# Patient Record
Sex: Female | Born: 1955
Health system: Southern US, Community
[De-identification: ages and names within clinical notes are randomized; demographics above are authoritative.]

## PROBLEM LIST (undated history)

## (undated) DIAGNOSIS — E119 Type 2 diabetes mellitus without complications: Secondary | ICD-10-CM

## (undated) DIAGNOSIS — R609 Edema, unspecified: Secondary | ICD-10-CM

## (undated) DIAGNOSIS — E7800 Pure hypercholesterolemia, unspecified: Secondary | ICD-10-CM

## (undated) DIAGNOSIS — I10 Essential (primary) hypertension: Secondary | ICD-10-CM

## (undated) DIAGNOSIS — R6250 Unspecified lack of expected normal physiological development in childhood: Secondary | ICD-10-CM

## (undated) DIAGNOSIS — J40 Bronchitis, not specified as acute or chronic: Secondary | ICD-10-CM

## (undated) DIAGNOSIS — D649 Anemia, unspecified: Secondary | ICD-10-CM

## (undated) DIAGNOSIS — I429 Cardiomyopathy, unspecified: Secondary | ICD-10-CM

## (undated) HISTORY — PX: COLONOSCOPY: SHX174

## (undated) HISTORY — DX: Essential (primary) hypertension: I10

## (undated) HISTORY — DX: Edema, unspecified: R60.9

## (undated) HISTORY — PX: OTHER SURGICAL HISTORY: SHX169

## (undated) HISTORY — DX: Cardiomyopathy, unspecified: I42.9

---

## 2008-04-28 ENCOUNTER — Ambulatory Visit: Payer: Self-pay | Admitting: Cardiology

## 2008-06-23 ENCOUNTER — Ambulatory Visit: Payer: Self-pay | Admitting: Cardiology

## 2008-07-08 ENCOUNTER — Ambulatory Visit: Payer: Self-pay | Admitting: Cardiology

## 2008-07-08 ENCOUNTER — Encounter: Payer: Self-pay | Admitting: Cardiology

## 2008-07-08 LAB — CONVERTED CEMR LAB
BUN: 12 mg/dL (ref 6–23)
CO2: 24 meq/L (ref 19–32)
Chloride: 101 meq/L (ref 96–112)
Potassium: 4.4 meq/L (ref 3.5–5.3)

## 2008-11-10 DIAGNOSIS — R609 Edema, unspecified: Secondary | ICD-10-CM | POA: Insufficient documentation

## 2008-11-10 DIAGNOSIS — I152 Hypertension secondary to endocrine disorders: Secondary | ICD-10-CM | POA: Insufficient documentation

## 2008-11-10 DIAGNOSIS — I10 Essential (primary) hypertension: Secondary | ICD-10-CM

## 2008-11-10 DIAGNOSIS — I429 Cardiomyopathy, unspecified: Secondary | ICD-10-CM

## 2008-11-17 ENCOUNTER — Ambulatory Visit: Payer: Self-pay | Admitting: Cardiology

## 2008-12-22 ENCOUNTER — Encounter: Payer: Self-pay | Admitting: Cardiology

## 2008-12-22 ENCOUNTER — Ambulatory Visit: Payer: Self-pay

## 2009-02-10 ENCOUNTER — Ambulatory Visit: Payer: Self-pay | Admitting: Gastroenterology

## 2009-12-21 ENCOUNTER — Ambulatory Visit: Payer: Self-pay | Admitting: Cardiovascular Disease

## 2010-07-19 NOTE — Assessment & Plan Note (Signed)
Summary: F1Y/AMD  Medications Added PREMPRO 0.625-2.5 MG TABS (CONJ ESTROG-MEDROXYPROGEST ACE) 1 once daily PREVACID SOLUTAB 30 MG TBDP (LANSOPRAZOLE) 1 once daily FUROSEMIDE 20 MG TABS (FUROSEMIDE) 1 once daily LISINOPRIL 20 MG TABS (LISINOPRIL) 1 once daily KLOR-CON M10 10 MEQ CR-TABS (POTASSIUM CHLORIDE CRYS CR) 1 once daily LEVOXYL 100 MCG TABS (LEVOTHYROXINE SODIUM) 1 once daily LIPITOR 20 MG TABS (ATORVASTATIN CALCIUM) Take one tablet by mouth daily.      Allergies Added: NKDA  Visit Type:  Follow-up Primary Provider:  Meindert Niemeyer,M.D.  CC:  No cardiac complants.  History of Present Illness: 55 year old woman with obesity, hypertension, mild LV dysfunction with mildly dilated left atrium and left ventricle thought to be secondary to hypertension, presents for routine followup.  Overall, she states that she is doing well. Her weight has been an issue. She is not very compliant with her diet. Her blood pressure has been well-controlled with systolic pressures in the 130s, diastolic in the 80s. She has no complaints. No significant lower extremity edema. She has edema, she takes her Lasix. She's otherwise been tolerating her medicines well. She has a job, is on her feet more on the weekends and has more swelling of her legs on the weekends.  EKG shows normal sinus rhythm with rate 71 beats per minute, no significant ST or T wave changes.  Current Medications (verified): 1)  Prempro 0.625-2.5 Mg Tabs (Conj Estrog-Medroxyprogest Ace) .Marland Kitchen.. 1 Once Daily 2)  Prevacid Solutab 30 Mg Tbdp (Lansoprazole) .Marland Kitchen.. 1 Once Daily 3)  Furosemide 20 Mg Tabs (Furosemide) .Marland Kitchen.. 1 Once Daily 4)  Lisinopril 20 Mg Tabs (Lisinopril) .Marland Kitchen.. 1 Once Daily 5)  Klor-Con M10 10 Meq Cr-Tabs (Potassium Chloride Crys Cr) .Marland Kitchen.. 1 Once Daily 6)  Levoxyl 100 Mcg Tabs (Levothyroxine Sodium) .Marland Kitchen.. 1 Once Daily 7)  Lipitor 20 Mg Tabs (Atorvastatin Calcium) .... Take One Tablet By Mouth Daily.  Allergies  (verified): No Known Drug Allergies  Past History:  Past Medical History: Last updated: 11/10/2008 EDEMA (ICD-782.3) HYPERTENSION, UNSPECIFIED (ICD-401.9) CARDIOMYOPATHY, SECONDARY (ICD-425.9)    Family History: Last updated: 11/10/2008 Family History of Coronary Artery Disease:  Family History of CVA or Stroke:   Social History: Last updated: 11/10/2008 Full Time Single  Tobacco Use - No.  Alcohol Use - no Regular Exercise - yes Drug Use - no  Risk Factors: Exercise: yes (11/10/2008)  Risk Factors: Smoking Status: never (11/10/2008)  Review of Systems  The patient denies fever, weight loss, weight gain, vision loss, decreased hearing, hoarseness, chest pain, syncope, dyspnea on exertion, peripheral edema, prolonged cough, abdominal pain, incontinence, muscle weakness, depression, and enlarged lymph nodes.    Vital Signs:  Patient profile:   55 year old female Height:      66 inches Weight:      238 pounds BMI:     38.55 Pulse rate:   64 / minute BP sitting:   140 / 92  (left arm) Cuff size:   large  Vitals Entered By: Bishop Dublin, CMA (December 21, 2009 10:54 AM)  Physical Exam  General:  Well developed, well nourished, in no acute distress.Obese Head:  normocephalic and atraumatic Neck:  Neck supple, no JVD. No masses, thyromegaly or abnormal cervical nodes. Chest Wall:  no deformities or breast masses noted Lungs:  Clear bilaterally to auscultation and percussion. Heart:  Non-displaced PMI, chest non-tender; regular rate and rhythm, S1, S2 without murmurs, rubs or gallops. Carotid upstroke normal, no bruit.  Pedals normal pulses. No edema, no varicosities.  Abdomen:  Bowel sounds positive; abdomen soft and non-tender without masses Msk:  Back normal, normal gait. Muscle strength and tone normal. Pulses:  pulses normal in all 4 extremities Extremities:  No clubbing or cyanosis. Neurologic:  Alert and oriented x 3. Skin:  Intact without lesions or  rashes. Psych:  Normal affect.   Impression & Recommendations:  Problem # 1:  HYPERTENSION, UNSPECIFIED (ICD-401.9) blood pressure is borderline elevated today though she reports it is well-controlled at home. He made no changes to her current medications I have asked her to contact me if her pressure is elevated and we could increase her dose of lisinopril or add a second medicine.  Her updated medication list for this problem includes:    Furosemide 20 Mg Tabs (Furosemide) .Marland Kitchen... 1 once daily    Lisinopril 20 Mg Tabs (Lisinopril) .Marland Kitchen... 1 once daily  Problem # 2:  CARDIOMYOPATHY, SECONDARY (ICD-425.9) No symptoms of shortness of breath or heart failure. We'll continue her on her diuretic with good blood pressure control. I will talk to her on her next visit about snoring and sleep apnea.  Her updated medication list for this problem includes:    Furosemide 20 Mg Tabs (Furosemide) .Marland Kitchen... 1 once daily    Lisinopril 20 Mg Tabs (Lisinopril) .Marland Kitchen... 1 once daily  Orders: EKG w/ Interpretation (93000)  Problem # 3:  EDEMA (ICD-782.3) Minimal edema. This may in fact be due to venous insufficiency as it gets worse on the weekends when she is standing for prolonged periods of time. I've encouraged her to wear compression hose when she stands for prolonged periods.  Patient Instructions: 1)  Your physician recommends that you continue on your current medications as directed. Please refer to the Current Medication list given to you today. 2)  Your physician wants you to follow-up in:  1 year  You will receive a reminder letter in the mail two months in advance. If you don't receive a letter, please call our office to schedule the follow-up appointment.  Appended Document: F1Y/AMD she does not have significant cardiac risk factors for coronary artery disease. She is under our age of 35 for women for starting aspirin. We'll hold off on aspirin for several years.

## 2010-11-01 NOTE — Assessment & Plan Note (Signed)
[facility] OFFICE NOTE   NAME:NEWELLShatoria, Stooksbury                       MRN:          [id number]  DATE:04/28/2008                            DOB:          [birth year]    HISTORY OF PRESENT ILLNESS:  I was asked by Dr. Gelene Mink to consult  on Camilla Skeen, 55 year old single white female with a  cardiomyopathy.   Ms. Dech has been seeing Dr. Timoteo Gaul for some lower extremity edema and  some problems with breathing.  Apparently, she has restrictive lung  defect from obesity and has allergies and seasonal rhinitis.  He started  her on a inhaler, which has helped in the form of Nasonex and Symbicort.   A 2-D echocardiogram on March 02, 2008 showed LVEF of 45%, mildly  dilated left atrium and left ventricle, mild left ventricular systolic  dysfunction, septal hypokinesia, and mild left ventricular hypertrophy.  There are no valvular abnormalities.   She denies any orthopnea, PND, or significant dyspnea on exertion.  She  has had no angina.   PAST MEDICAL HISTORY:   ALLERGIES:  She is intolerant of ALLEGRA.   MEDICATIONS:  She is currently on Symbicort 2 puffs daily, Levoxyl 100  mcg per day, fexofenadine 180 mg a day, lisinopril 10 mg a day, Lipitor  20 mg a day, Prempro 0.625/2.5 daily, Prevacid 30 mg p.o. daily, and  Nasonex 2 sprays daily.   SOCIAL HISTORY:  She does not smoke.  She does not drink.  She enjoys  walking and using her exercise bike.  She works in a Nurse, adult, etc.   She had a history of hypertension for several years according to her  family.   SURGICAL HISTORY:  Negative.   FAMILY HISTORY:  Father died of a heart attack and stroke at age 76.   REVIEW OF SYSTEMS:  Other than HPI is remarkable for borderline diabetes  and hypothyroidism on replacement therapy.  She also has hyperlipidemia.   PHYSICAL EXAMINATION:  VITAL SIGNS:  Her blood pressure  today was  130/76, her pulse is 77 and regular.  EKG is normal.  She weighs 237  pounds.  GENERAL:  She is very pleasant, alert, and oriented x3, answers  questions appropriately.  HEENT:  Essentially normal.  NECK:  Supple.  Carotids upstrokes are equal bilaterally without bruits,  no JVD.  Thyroid is not enlarged.  LUNGS:  Clear to auscultation and percussion with no rhonchi, wheezes,  or rales.  HEART:  Reveals a poorly appreciated PMI.  There is no S3 gallop.  ABDOMEN:  Protuberant, good bowel sounds.  Organomegaly cannot be easily  assessed.  EXTREMITIES:  Reveal 1+ edema.  Pulses are intact.   ASSESSMENT:  Hypertensive cardiomyopathy.  There is also a contribution  here from her restrictive lung disease and obesity.   PLAN:  1. Increase lisinopril to 20 mg a day.  2. Salt restriction.  3. Weight reduction.  4. Regular exercise.  5. See [place] back in about 4-6 weeks.  We may have to add low dose  of a      diuretic as well.   I looked through Dr. Marliss Coots records and her labs including a CBC,  comprehensive metabolic panel, and a TSH were normal.  Her free T4 was  slightly up, which will need to be followed up with Dr. Timoteo Gaul.  Her  total cholesterol was 180.  Her LDL is 96.  Her triglycerides are 103  and her HDL 63.     Thomas C. Daleen Squibb, MD, [facility]  Electronically Signed    TCW/MedQ  DD: 04/28/2008  DT: 04/29/2008  Job #: [id number]   cc:   Galen Daft. Timoteo Gaul, MD

## 2010-11-01 NOTE — Assessment & Plan Note (Signed)
[facility] OFFICE NOTE   NAME:NEWELLNaturi, Alarid                       MRN:          [id number]  DATE:11/17/2008                            DOB:          1956/05/31    Ms. Usery comes in today for followup of her hypertensive  cardiomyopathy, hypertension, obesity.   I saw her last on June 23, 2008.   At that time, she did not have good blood pressure control.  We added  furosemide 20 mg p.o. q.a.m. which has helped her edema.  She also wears  support hose.  We also emphasized potassium-rich diets.  We followed up  a chemistry which showed a potassium of 4.1, BUN of 12, creatinine of  0.75.  Her blood sugar was elevated at 124.   She has no complaints today including any symptoms of TIAs or mini  strokes, syncope or presyncope, palpitations, orthopnea, PND, and she  only has minor peripheral edema.  She walks about 30 minutes 5 times a  week.   Her meds are unchanged except for the addition of the furosemide which I  gave her last time.   PHYSICAL EXAMINATION:  VITAL SIGNS:  Blood pressure 130/78, her pulse 80  and regular, weight is 236 which is stable.  HEENT:  Unchanged.  Carotid upstrokes were equal bilaterally with soft  systolic sound on the left.  CHEST:  Lungs are clear to auscultation and percussion.  HEART:  A nondisplaced PMI, normal S1 and S2, soft systolic murmur along  the left sternal border.  There is no gallop.  ABDOMEN:  Soft, good bowel sounds.  No midline bruit.  No hepatomegaly.  EXTREMITIES:  No sinus clubbing, but she does have trace pretibial  edema.  She is wearing support hose.  Pulses are intact.  No sign of  DVT.  NEURO:  She is alert and oriented x3.   ASSESSMENT AND PLAN:  Ms. Killough blood pressure is better.  I have  emphasized again regular exercise which she is doing and also weight  control.  Followup BMET was within normal limits on furosemide.  She  does have a  carotid sound and has not had an echocardiogram in nearly a  year to assess her LV function.  We will arrange for carotid Dopplers  and an echocardiogram.  Assuming these were stable, we will plan on  seeing her back in a year.  If she has any carotid disease, we will  place her on enteric-coated aspirin.    Thomas C. Daleen Squibb, MD, [facility]  Electronically Signed   TCW/MedQ  DD: 11/17/2008  DT: 11/18/2008  Job #: [id number]   cc:   Dr. Gelene Mink

## 2010-11-01 NOTE — Assessment & Plan Note (Signed)
[facility] OFFICE NOTE   NAME:Emma Fletcher, Emma Fletcher                       MRN:          [id number]  DATE:06/23/2008                            DOB:          07/21/1955    Ms. Yearick comes in today for followup.  I saw her initially on April 28, 2008, for her hypertensive cardiomyopathy.   I increased her lisinopril to 20 mg per day from 10 mg per day.   Her care Raenell Mensing says that her blood pressure is still running in the  146s over 80s.  Her weight has dropped 2 pounds, however.  Her edema  seems to be a little bit better.  She wears support hose up to her  knees.   She offers no new complaints.   PHYSICAL EXAMINATION:  VITAL SIGNS:  Her blood pressure today is 147/88,  pulse is 89 and regular, her weight is 235.  HEART:  Regular rate and rhythm.  No gallop.  LUNGS:  Clear.  EXTREMITIES:  1+ pitting edema.  Pulses were present.  Rest of her exam is unchanged.   ASSESSMENT:  1. Hypertensive cardiomyopathy.  2. Mild peripheral edema.  3. Hypertension, under non-optimal control.   PLAN:  1. Furosemide 20 mg p.o. q.a.m.  2. Potassium rich foods.  3. Follow a chemistry profile in a week to 10 days.  4. See me back in 3 months.   Her caretaker knows that her blood pressure needs to be in the 130  systolic over 80s or less diastolic.     Thomas C. Daleen Squibb, MD, [facility]  Electronically Signed    TCW/MedQ  DD: 06/23/2008  DT: 06/24/2008  Job #: [id number]   cc:   Gelene Mink, MD

## 2010-11-29 ENCOUNTER — Encounter: Payer: Self-pay | Admitting: Cardiovascular Disease

## 2012-12-14 ENCOUNTER — Ambulatory Visit: Payer: Self-pay | Admitting: Family Medicine

## 2015-04-23 ENCOUNTER — Encounter: Payer: Self-pay | Admitting: Sports Medicine

## 2015-04-23 ENCOUNTER — Ambulatory Visit (INDEPENDENT_AMBULATORY_CARE_PROVIDER_SITE_OTHER): Payer: Medicare Other | Admitting: Sports Medicine

## 2015-04-23 DIAGNOSIS — R600 Localized edema: Secondary | ICD-10-CM

## 2015-04-23 DIAGNOSIS — E119 Type 2 diabetes mellitus without complications: Secondary | ICD-10-CM | POA: Diagnosis not present

## 2015-04-23 DIAGNOSIS — L603 Nail dystrophy: Secondary | ICD-10-CM

## 2015-04-23 DIAGNOSIS — L84 Corns and callosities: Secondary | ICD-10-CM

## 2015-04-23 DIAGNOSIS — L309 Dermatitis, unspecified: Secondary | ICD-10-CM

## 2015-04-23 MED ORDER — CLOTRIMAZOLE 1 % EX CREA: 1.0000 "application " | TOPICAL_CREAM | Freq: Two times a day (BID) | CUTANEOUS | Status: DC

## 2015-04-23 NOTE — Progress Notes (Deleted)
   Subjective:    Patient ID: Emma Fletcher, female    DOB: [birth year], 59 y.o.   MRN: [id number]  HPI    Review of Systems  HENT: Positive for hearing loss.        Sinus problems   Cardiovascular:       High BP   Gastrointestinal:       Reflex  Endocrine:       Diabetes   Musculoskeletal:       Gout Swelling of the feet or legs   All other systems reviewed and are negative.      Objective:   Physical Exam        Assessment & Plan:

## 2015-04-23 NOTE — Progress Notes (Signed)
Patient ID: Emma Fletcher, female   DOB: 07-Aug-1955, 59 y.o.   MRN: [id number] Subjective: Emma Fletcher is a 59 y.o. female patient with history of type 2 diabetes who presents to office today complaining of callus skin and long, painful nails  while ambulating in shoes; unable to trim. Patient states that the glucose reading this morning was 96 mg/dl. Patient is assisted by Aunt. Patient also states that her left ankle swells after sprain 3 years ago. Admits to callus skin on bottom of feet and peeling; soaks multiple times per day; denies any new changes in medication or other problems. Patient denies any new cramping, numbness, burning or tingling in the legs.  Admits to past hx of gout in left big toe that remains resolved at this time.   Patient Active Problem List   Diagnosis Date Noted  . HYPERTENSION, UNSPECIFIED 11/10/2008  . CARDIOMYOPATHY, SECONDARY 11/10/2008  . EDEMA 11/10/2008   Current Outpatient Prescriptions on File Prior to Visit  Medication Sig Dispense Refill  . atorvastatin (LIPITOR) 20 MG tablet Take 20 mg by mouth daily.      Marland Kitchen estrogen, conjugated,-medroxyprogesterone (PREMPRO) 0.625-2.5 MG per tablet Take 1 tablet by mouth daily.      . furosemide (LASIX) 20 MG tablet Take 20 mg by mouth daily.      . lansoprazole (PREVACID SOLUTAB) 30 MG disintegrating tablet Take 30 mg by mouth daily.      Marland Kitchen levothyroxine (SYNTHROID, LEVOTHROID) 100 MCG tablet Take 100 mcg by mouth daily.      Marland Kitchen lisinopril (PRINIVIL,ZESTRIL) 20 MG tablet Take 20 mg by mouth daily.      . potassium chloride (KLOR-CON) 10 MEQ CR tablet Take 10 mEq by mouth daily.       No current facility-administered medications on file prior to visit.   Allergies not on file   Labs: HEMOGLOBIN A1C- No recent lab on file  Objective: General: Patient is awake, alert, and oriented x 3 and in no acute distress.  Integument: Skin is warm, dry and supple bilateral. Nails are tender, long,  and  dystrophic ,  1-5 bilateral. No signs of infection.  Callus present to right 5th met base with nucleated core with no signs of infection. There is significant peeling of plantar skin bilateral with likely superimposed dermatitis secondary to patient soaking her feet multiple times a day in hot water. Remaining integument unremarkable.  Vasculature:  Dorsalis Pedis pulse 2/4 bilateral. Posterior Tibial pulse 1 /4 bilateral.  Capillary fill time <3 sec 1-5 bilateral. Scant hair growth to the level of the digits. Temperature gradient within normal limits. + varicosities present bilateral. + edema present L>R ankle; old hx of sprain.   Neurology: The patient has intact sensation measured with a 5.07/10g Semmes Weinstein Monofilament at all pedal sites bilateral . Vibratory sensation diminished bilateral with tuning fork. No Babinski sign present bilateral.   Musculoskeletal: No gross pedal deformities noted bilateral. Muscular strength 5/5 in all lower extremity muscular groups bilateral without pain or limitation on range of motion . No tenderness with calf compression bilateral.  Assessment and Plan: Problem List Items Addressed This Visit      Other   EDEMA    Other Visit Diagnoses    Callus    -  Primary    Right 5th met base    Dermatitis        Relevant Medications    clotrimazole (LOTRIMIN) 1 % cream    Dystrophic nail  Diabetes mellitus without complication ([place])          -Examined patient. -Discussed and educated patient on diabetic foot care, especially with  regards to the vascular, neurological and musculoskeletal systems.  -Stressed the importance of good glycemic control and the detriment of not  controlling glucose levels in relation to the foot. -Mechanically trimmed all nails 1-5 bilateral using sterile nail nipper and filed with dremel without incident  -Debrided callus x 1 to right 5th met base using sterile chisel blade without incident. -Pared free edges of peeling skin at  plantar aspects of both feet using sterile chisel blade without incident; Rx Lotrimin cream to apply daily; Advised patient to refrain from repeated soaking. -Dispense left ankle for edema control and support. -Answered all patient questions -Patient to return as needed or in 3 months for at risk foot care -Patient advised to call the office if any problems or questions arise in the  Meantime.  Landis Martins, DPM

## 2015-07-30 ENCOUNTER — Encounter: Payer: Self-pay | Admitting: Sports Medicine

## 2015-07-30 ENCOUNTER — Ambulatory Visit (INDEPENDENT_AMBULATORY_CARE_PROVIDER_SITE_OTHER): Payer: Medicare Other | Admitting: Sports Medicine

## 2015-07-30 DIAGNOSIS — B351 Tinea unguium: Secondary | ICD-10-CM

## 2015-07-30 DIAGNOSIS — L603 Nail dystrophy: Secondary | ICD-10-CM

## 2015-07-30 DIAGNOSIS — M79671 Pain in right foot: Secondary | ICD-10-CM | POA: Diagnosis not present

## 2015-07-30 DIAGNOSIS — M779 Enthesopathy, unspecified: Secondary | ICD-10-CM | POA: Diagnosis not present

## 2015-07-30 DIAGNOSIS — M79672 Pain in left foot: Secondary | ICD-10-CM | POA: Diagnosis not present

## 2015-07-30 DIAGNOSIS — E119 Type 2 diabetes mellitus without complications: Secondary | ICD-10-CM | POA: Diagnosis not present

## 2015-07-30 MED ORDER — TRIAMCINOLONE ACETONIDE 10 MG/ML IJ SUSP: 10.0000 mg | Freq: Once | INTRAMUSCULAR | Status: DC

## 2015-07-30 NOTE — Progress Notes (Signed)
Patient ID: BRAYLEY MACKOWIAK, female   DOB: [birth year], 60 y.o.   MRN: [id number]  Subjective: Emma Fletcher is a 60 y.o. female patient with history of type 2 diabetes who presents to office today complaining of long, painful nails  while ambulating in shoes; unable to trim and a new pain that started last week at the outside of her right foot; states that she has been working more at the [employer] and this may have caused her foot to hurt some. Patient states that the glucose reading this morning was 65 mg/dl. Patient is assisted by Aunt.  Patient denies any new cramping, numbness, burning or tingling in the legs.   Patient Active Problem List   Diagnosis Date Noted  . HYPERTENSION, UNSPECIFIED 11/10/2008  . CARDIOMYOPATHY, SECONDARY 11/10/2008  . EDEMA 11/10/2008   Current Outpatient Prescriptions on File Prior to Visit  Medication Sig Dispense Refill  . atorvastatin (LIPITOR) 20 MG tablet Take 20 mg by mouth daily.      . clotrimazole (LOTRIMIN) 1 % cream Apply 1 application topically 2 (two) times daily. 30 g 0  . estrogen, conjugated,-medroxyprogesterone (PREMPRO) 0.625-2.5 MG per tablet Take 1 tablet by mouth daily.      . furosemide (LASIX) 20 MG tablet Take 20 mg by mouth daily.      Marland Kitchen levothyroxine (SYNTHROID, LEVOTHROID) 100 MCG tablet Take 100 mcg by mouth daily.      Marland Kitchen lisinopril (PRINIVIL,ZESTRIL) 20 MG tablet Take 20 mg by mouth daily.      . potassium chloride (KLOR-CON) 10 MEQ CR tablet Take 10 mEq by mouth daily.       No current facility-administered medications on file prior to visit.   No Known Allergies  Objective: General: Patient is awake, alert, and oriented x 3 and in no acute distress.  Integument: Skin is warm, dry and supple bilateral. Nails are tender, long,  and  dystrophic , 1-5 bilateral. No signs of infection.  Minimal Callus present to right 5th met base. There is mild soft tissue swelling at base of the right 5th metatarsal at the dorsolateral  aspect with no signs of infection. No peeling or scaly skin plantar aspects of both feet. Remaining integument unremarkable.  Vasculature:  Dorsalis Pedis pulse 2/4 bilateral. Posterior Tibial pulse 1 /4 bilateral. Capillary fill time <3 sec 1-5 bilateral. Scant hair growth to the level of the digits.Temperature gradient within normal limits. + varicosities present bilateral.   Neurology: The patient has intact sensation measured with a 5.07/10g Semmes Weinstein Monofilament at all pedal sites bilateral . Vibratory sensation diminished bilateral with tuning fork. No Babinski sign present bilateral.   Musculoskeletal: Pain with palpation of 5th metatarsal base on right with associated swelling, No gross pedal deformities noted bilateral. Muscular strength 5/5 in all lower extremity muscular groups bilateral without pain or limitation on range of motion except with inversion at base of 5th met on right . No tenderness with calf compression bilateral.  Assessment and Plan: Problem List Items Addressed This Visit    None    Visit Diagnoses    Diabetes mellitus without complication ([place])    -  Primary    Relevant Medications    pioglitazone (ACTOS) 15 MG tablet    TRADJENTA 5 MG TABS tablet    glimepiride (AMARYL) 2 MG tablet    Dystrophic nail        Tendonitis        PB at 5th met base on right  Relevant Medications    triamcinolone acetonide (KENALOG) 10 MG/ML injection 10 mg (Start on 07/30/2015  2:30 PM)    Foot pain, bilateral          -Examined patient. -Discussed and educated patient on diabetic foot care, especially with  regards to the vascular, neurological and musculoskeletal systems.  -Stressed the importance of good glycemic control and the detriment of not  controlling glucose levels in relation to the foot. -Mechanically trimmed all nails 1-5 bilateral using sterile nail nipper and filed with dremel without incident  -After oral consent and aseptic prep, injected a mixture  containing 1 ml of 2%  plain lidocaine, 1 ml 0.5% plain marcaine, 0.5 ml of kenalog 10 and 0.5 ml of dexamethasone phosphate into right 5th metatarsal base. Post-injection care discussed with patient. Recommend ice at end of work shift until area feels better -Cont with lotrimin as needed -Cont with compression stockings daily -Answered all patient questions -Patient to return as needed or in 3 months for at risk foot care -Patient advised to call the office if any problems or questions arise in the meantime.  Landis Martins, DPM

## 2015-09-07 ENCOUNTER — Ambulatory Visit (INDEPENDENT_AMBULATORY_CARE_PROVIDER_SITE_OTHER): Payer: Medicare Other | Admitting: Sports Medicine

## 2015-09-07 ENCOUNTER — Encounter: Payer: Self-pay | Admitting: Sports Medicine

## 2015-09-07 ENCOUNTER — Ambulatory Visit (INDEPENDENT_AMBULATORY_CARE_PROVIDER_SITE_OTHER): Payer: Medicare Other

## 2015-09-07 DIAGNOSIS — E119 Type 2 diabetes mellitus without complications: Secondary | ICD-10-CM | POA: Diagnosis not present

## 2015-09-07 DIAGNOSIS — M79671 Pain in right foot: Secondary | ICD-10-CM

## 2015-09-07 DIAGNOSIS — M779 Enthesopathy, unspecified: Secondary | ICD-10-CM | POA: Diagnosis not present

## 2015-09-07 NOTE — Progress Notes (Signed)
Patient ID: MEI SUITS, female   DOB: 08/10/1955, 60 y.o.   MRN: [id number]   Subjective: Emma Fletcher is a 60 y.o. female patient with history of type 2 diabetes who presents to office today complaining of pain that has started back at the side of her right foot; states that she has been working more at the [employer] and this may have caused her foot to hurt some more. Patient states that the first injection helped a lot at the area. Patient states that the glucose reading this morning was 95 mg/dl.  Patient denies any new cramping, numbness, burning or tingling in the legs.   Patient Active Problem List   Diagnosis Date Noted  . HYPERTENSION, UNSPECIFIED 11/10/2008  . CARDIOMYOPATHY, SECONDARY 11/10/2008  . EDEMA 11/10/2008   Current Outpatient Prescriptions on File Prior to Visit  Medication Sig Dispense Refill  . atorvastatin (LIPITOR) 20 MG tablet Take 20 mg by mouth daily.      . Choline Fenofibrate (FENOFIBRIC ACID) 135 MG CPDR     . clotrimazole (LOTRIMIN) 1 % cream Apply 1 application topically 2 (two) times daily. 30 g 0  . estrogen, conjugated,-medroxyprogesterone (PREMPRO) 0.625-2.5 MG per tablet Take 1 tablet by mouth daily.      . furosemide (LASIX) 20 MG tablet Take 20 mg by mouth daily.      Marland Kitchen glimepiride (AMARYL) 2 MG tablet     . lansoprazole (PREVACID) 30 MG capsule     . levothyroxine (SYNTHROID, LEVOTHROID) 100 MCG tablet Take 100 mcg by mouth daily.      Marland Kitchen lisinopril (PRINIVIL,ZESTRIL) 20 MG tablet Take 20 mg by mouth daily.      . pioglitazone (ACTOS) 15 MG tablet     . potassium chloride (KLOR-CON) 10 MEQ CR tablet Take 10 mEq by mouth daily.      Marland Kitchen SPIRIVA HANDIHALER 18 MCG inhalation capsule     . TRADJENTA 5 MG TABS tablet      Current Facility-Administered Medications on File Prior to Visit  Medication Dose Route Frequency Provider Last Rate Last Dose  . triamcinolone acetonide (KENALOG) 10 MG/ML injection 10 mg  10 mg Other Once [employer], DPM        No Known Allergies  Objective: General: Patient is awake, alert, and oriented x 3 and in no acute distress.  Integument: Skin is warm, dry and supple bilateral. Nails are short dystrophic , 1-5 bilateral. No signs of infection.  Minimal Callus present to right 5th met base. There is mild soft tissue swelling at base of the right 5th metatarsal at the dorsolateral aspect with no signs of infection. No peeling or scaly skin plantar aspects of both feet. Remaining integument unremarkable.  Vasculature:  Dorsalis Pedis pulse 2/4 bilateral. Posterior Tibial pulse 1 /4 bilateral. Capillary fill time <3 sec 1-5 bilateral. Scant hair growth to the level of the digits.Temperature gradient within normal limits. + varicosities present bilateral.   Neurology: The patient has intact sensation measured with a 5.07/10g Semmes Weinstein Monofilament at all pedal sites bilateral . Vibratory sensation diminished bilateral with tuning fork. No Babinski sign present bilateral.   Musculoskeletal: Pain with palpation of 5th metatarsal base on right with associated swelling, Mild hammertoe gross pedal deformities noted bilateral. Muscular strength 5/5 in all lower extremity muscular groups bilateral without pain or limitation on range of motion except with inversion at base of 5th met on right . No tenderness with calf compression bilateral.  Xray, Right foot Normal osseous  mineralization, no fracture or dislocation, enthesopathy and midfoot osteoarthritis, hammertoe deformity, soft tissues within normal limits, no foreign body, no other acute findings.   Assessment and Plan: Problem List Items Addressed This Visit    None    Visit Diagnoses    Right foot pain    -  Primary    Relevant Orders    DG Foot 2 Views Right    Diabetes mellitus without complication (HCC)        Tendonitis        PB 5th met base, Right foot      -Examined patient. -Discussed and educated patient on diabetic foot care,  especially with  regards to the vascular, neurological and musculoskeletal systems.  -Stressed the importance of good glycemic control and the detriment of not  controlling glucose levels in relation to the foot. -Xrays reviewed  -After oral consent and aseptic prep, injected a mixture containing 1 ml of 2%  plain lidocaine, 1 ml 0.5% plain marcaine, 0.5 ml of kenalog 10 and 0.5 ml of dexamethasone phosphate into right 5th metatarsal base. Injection #2. Post-injection care discussed with patient. Recommend ice at end of work shift until area feels better.  -Rx given for [facility] for functional inserts -Cont with lotrimin as needed -Cont with compression stockings daily -Answered all patient questions -Patient to return as scheduled for routine care -Patient advised to call the office if any problems or questions arise in the meantime.  Landis Martins, DPM

## 2015-09-22 ENCOUNTER — Ambulatory Visit (INDEPENDENT_AMBULATORY_CARE_PROVIDER_SITE_OTHER): Payer: Medicare Other | Admitting: Sports Medicine

## 2015-09-22 DIAGNOSIS — M779 Enthesopathy, unspecified: Secondary | ICD-10-CM | POA: Diagnosis not present

## 2015-09-23 NOTE — Progress Notes (Signed)
Fenton Foy, certified pedorthist, discuss orthotics wear and shoe gear. She did tell the patient that she needed to get better shoes, but patient said she didn't want to spend the money to do so.  Patient discussed with medical assistant. Agree with above. Patient to follow up as scheduled for continued care or sooner if problems or issues arise. -Dr. Marylene Land

## 2015-10-20 ENCOUNTER — Ambulatory Visit (INDEPENDENT_AMBULATORY_CARE_PROVIDER_SITE_OTHER): Payer: Medicare Other | Admitting: Sports Medicine

## 2015-10-20 DIAGNOSIS — E119 Type 2 diabetes mellitus without complications: Secondary | ICD-10-CM

## 2015-10-20 DIAGNOSIS — M779 Enthesopathy, unspecified: Secondary | ICD-10-CM

## 2015-10-25 ENCOUNTER — Telehealth: Payer: Self-pay | Admitting: *Deleted

## 2015-10-25 NOTE — Telephone Encounter (Signed)
"  Call me back sometime today."

## 2015-10-26 ENCOUNTER — Encounter: Payer: Self-pay | Admitting: Sports Medicine

## 2015-10-26 NOTE — Telephone Encounter (Signed)
I'm returning your call.  "I already called back.  I have an appointment the end of the month of May."

## 2015-10-26 NOTE — Progress Notes (Signed)
Measured for diabetic shoes and insoles.  Patient discussed with medical assistant. Agree with above. Patient to follow up as scheduled for continued care or sooner if problems or issues arise. -Dr. Dimitris Shanahan 

## 2015-10-29 ENCOUNTER — Ambulatory Visit: Payer: Medicare Other | Admitting: Sports Medicine

## 2015-11-11 ENCOUNTER — Telehealth: Payer: Self-pay | Admitting: Sports Medicine

## 2015-11-11 NOTE — Telephone Encounter (Signed)
Pt called to see if her diabetic shoes are in yet. She is scheduled for 5.30.17 to see Dr Marylene Land in [place]. Please call pt and let her know if shoes are in.

## 2015-11-16 ENCOUNTER — Encounter: Payer: Self-pay | Admitting: Sports Medicine

## 2015-11-16 ENCOUNTER — Ambulatory Visit (INDEPENDENT_AMBULATORY_CARE_PROVIDER_SITE_OTHER): Payer: Medicare Other | Admitting: Sports Medicine

## 2015-11-16 ENCOUNTER — Ambulatory Visit: Payer: Medicare Other | Admitting: Sports Medicine

## 2015-11-16 VITALS — BP 115/51 | HR 82 | Resp 16

## 2015-11-16 DIAGNOSIS — E119 Type 2 diabetes mellitus without complications: Secondary | ICD-10-CM | POA: Diagnosis not present

## 2015-11-16 DIAGNOSIS — M79671 Pain in right foot: Secondary | ICD-10-CM

## 2015-11-16 DIAGNOSIS — M79672 Pain in left foot: Secondary | ICD-10-CM | POA: Diagnosis not present

## 2015-11-16 NOTE — Progress Notes (Signed)
Patient ID: CARLETA BOURDO, female   DOB: [birth year], 60 y.o.   MRN: [id number]  Subjective: Emma Fletcher is a 60 y.o. female patient with history of type 2 diabetes who presents to office today to pick up diabetic shoes. Patient states that the glucose reading this morning was 85 mg/dl.  Patient denies any new cramping, numbness, burning or tingling in the legs.   Patient Active Problem List   Diagnosis Date Noted  . HYPERTENSION, UNSPECIFIED 11/10/2008  . CARDIOMYOPATHY, SECONDARY 11/10/2008  . EDEMA 11/10/2008   Current Outpatient Prescriptions on File Prior to Visit  Medication Sig Dispense Refill  . atorvastatin (LIPITOR) 20 MG tablet Take 20 mg by mouth daily.      . Choline Fenofibrate (FENOFIBRIC ACID) 135 MG CPDR     . clotrimazole (LOTRIMIN) 1 % cream Apply 1 application topically 2 (two) times daily. 30 g 0  . estrogen, conjugated,-medroxyprogesterone (PREMPRO) 0.625-2.5 MG per tablet Take 1 tablet by mouth daily.      . furosemide (LASIX) 20 MG tablet Take 20 mg by mouth daily.      Marland Kitchen glimepiride (AMARYL) 2 MG tablet     . lansoprazole (PREVACID) 30 MG capsule     . levothyroxine (SYNTHROID, LEVOTHROID) 100 MCG tablet Take 100 mcg by mouth daily.      Marland Kitchen lisinopril (PRINIVIL,ZESTRIL) 20 MG tablet Take 20 mg by mouth daily.      . pioglitazone (ACTOS) 15 MG tablet     . potassium chloride (KLOR-CON) 10 MEQ CR tablet Take 10 mEq by mouth daily.      Marland Kitchen SPIRIVA HANDIHALER 18 MCG inhalation capsule     . TRADJENTA 5 MG TABS tablet      Current Facility-Administered Medications on File Prior to Visit  Medication Dose Route Frequency Provider Last Rate Last Dose  . triamcinolone acetonide (KENALOG) 10 MG/ML injection 10 mg  10 mg Other Once [employer], DPM       No Known Allergies  Objective: General: Patient is awake, alert, and oriented x 3 and in no acute distress.  Integument: Skin is warm, dry and supple bilateral. Nails are short dystrophic , 1-5 bilateral. No  signs of infection.  Remaining integument unremarkable.  Vasculature:  Dorsalis Pedis pulse 2/4 bilateral. Posterior Tibial pulse 1 /4 bilateral. Capillary fill time <3 sec 1-5 bilateral. Scant hair growth to the level of the digits.Temperature gradient within normal limits. + varicosities present bilateral.   Neurology: The patient has intact sensation measured with a 5.07/10g Semmes Weinstein Monofilament at all pedal sites bilateral . Vibratory sensation diminished bilateral with tuning fork. No Babinski sign present bilateral.   Musculoskeletal: No pain to palpation bilateral, Mild hammertoe gross pedal deformities noted bilateral. Muscular strength 5/5 in all lower extremity muscular groups bilateral without pain or limitation. No tenderness with calf compression bilateral.  Assessment and Plan: Problem List Items Addressed This Visit    None    Visit Diagnoses    Diabetes mellitus without complication (HCC)    -  Primary    Foot pain, bilateral          -Examined patient. -Discussed and educated patient on diabetic foot care, especially with  regards to the vascular, neurological and musculoskeletal systems.  -Stressed the importance of good glycemic control and the detriment of not  controlling glucose levels in relation to the foot. -Dispensed diabetic shoes with insoles and educated on use and proper break in period -Patient to return as scheduled for  routine care in 2.5 months -Patient advised to call the office if any problems or questions arise in the meantime.  Asencion Islam, DPM

## 2015-11-16 NOTE — Patient Instructions (Signed)

## 2015-12-18 ENCOUNTER — Observation Stay (HOSPITAL_BASED_OUTPATIENT_CLINIC_OR_DEPARTMENT_OTHER)
Admit: 2015-12-18 | Discharge: 2015-12-18 | Disposition: A | Discharge status: Home / Self Care | Payer: Medicare Other | Attending: Internal Medicine | Admitting: Internal Medicine

## 2015-12-18 ENCOUNTER — Observation Stay
Admission: EM | Admit: 2015-12-18 | Discharge: 2015-12-20 | Disposition: A | Discharge status: Home / Self Care | Payer: Medicare Other | Attending: Internal Medicine | Admitting: Internal Medicine

## 2015-12-18 ENCOUNTER — Observation Stay: Payer: Medicare Other

## 2015-12-18 ENCOUNTER — Emergency Department: Payer: Medicare Other

## 2015-12-18 ENCOUNTER — Encounter: Payer: Self-pay | Admitting: Emergency Medicine

## 2015-12-18 DIAGNOSIS — N390 Urinary tract infection, site not specified: Secondary | ICD-10-CM

## 2015-12-18 DIAGNOSIS — I429 Cardiomyopathy, unspecified: Secondary | ICD-10-CM | POA: Diagnosis not present

## 2015-12-18 DIAGNOSIS — Z1611 Resistance to penicillins: Secondary | ICD-10-CM | POA: Insufficient documentation

## 2015-12-18 DIAGNOSIS — E8770 Fluid overload, unspecified: Secondary | ICD-10-CM | POA: Insufficient documentation

## 2015-12-18 DIAGNOSIS — R011 Cardiac murmur, unspecified: Secondary | ICD-10-CM | POA: Diagnosis not present

## 2015-12-18 DIAGNOSIS — Z8249 Family history of ischemic heart disease and other diseases of the circulatory system: Secondary | ICD-10-CM | POA: Diagnosis not present

## 2015-12-18 DIAGNOSIS — I071 Rheumatic tricuspid insufficiency: Secondary | ICD-10-CM | POA: Diagnosis not present

## 2015-12-18 DIAGNOSIS — E119 Type 2 diabetes mellitus without complications: Secondary | ICD-10-CM

## 2015-12-18 DIAGNOSIS — E871 Hypo-osmolality and hyponatremia: Secondary | ICD-10-CM

## 2015-12-18 DIAGNOSIS — I10 Essential (primary) hypertension: Secondary | ICD-10-CM | POA: Diagnosis not present

## 2015-12-18 DIAGNOSIS — E11649 Type 2 diabetes mellitus with hypoglycemia without coma: Secondary | ICD-10-CM | POA: Diagnosis not present

## 2015-12-18 DIAGNOSIS — D649 Anemia, unspecified: Secondary | ICD-10-CM | POA: Diagnosis not present

## 2015-12-18 DIAGNOSIS — B9620 Unspecified Escherichia coli [E. coli] as the cause of diseases classified elsewhere: Secondary | ICD-10-CM | POA: Insufficient documentation

## 2015-12-18 DIAGNOSIS — N289 Disorder of kidney and ureter, unspecified: Secondary | ICD-10-CM | POA: Diagnosis present

## 2015-12-18 DIAGNOSIS — E162 Hypoglycemia, unspecified: Secondary | ICD-10-CM | POA: Diagnosis present

## 2015-12-18 DIAGNOSIS — Z823 Family history of stroke: Secondary | ICD-10-CM | POA: Insufficient documentation

## 2015-12-18 DIAGNOSIS — N10 Acute tubulo-interstitial nephritis: Secondary | ICD-10-CM | POA: Diagnosis not present

## 2015-12-18 DIAGNOSIS — E7800 Pure hypercholesterolemia, unspecified: Secondary | ICD-10-CM | POA: Diagnosis not present

## 2015-12-18 DIAGNOSIS — D638 Anemia in other chronic diseases classified elsewhere: Secondary | ICD-10-CM

## 2015-12-18 HISTORY — DX: Unspecified lack of expected normal physiological development in childhood: R62.50

## 2015-12-18 HISTORY — DX: Pure hypercholesterolemia, unspecified: E78.00

## 2015-12-18 HISTORY — DX: Type 2 diabetes mellitus without complications: E11.9

## 2015-12-18 HISTORY — DX: Anemia, unspecified: D64.9

## 2015-12-18 LAB — COMPREHENSIVE METABOLIC PANEL
ALBUMIN: 3.8 g/dL (ref 3.5–5.0)
ALK PHOS: 71 U/L (ref 38–126)
ALT: 30 U/L (ref 14–54)
AST: 33 U/L (ref 15–41)
Anion gap: 7 (ref 5–15)
BILIRUBIN TOTAL: 0.3 mg/dL (ref 0.3–1.2)
BUN: 29 mg/dL — AB (ref 6–20)
CALCIUM: 9.8 mg/dL (ref 8.9–10.3)
CO2: 23 mmol/L (ref 22–32)
Chloride: 104 mmol/L (ref 101–111)
Creatinine, Ser: 1.29 mg/dL — ABNORMAL HIGH (ref 0.44–1.00)
GFR calc Af Amer: 51 mL/min — ABNORMAL LOW (ref >60)
GFR calc non Af Amer: 44 mL/min — ABNORMAL LOW (ref >60)
GLUCOSE: 69 mg/dL (ref 65–99)
POTASSIUM: 4.9 mmol/L (ref 3.5–5.1)
SODIUM: 134 mmol/L — AB (ref 135–145)
TOTAL PROTEIN: 7.4 g/dL (ref 6.5–8.1)

## 2015-12-18 LAB — URINALYSIS COMPLETE WITH MICROSCOPIC (ARMC ONLY)
BILIRUBIN URINE: NEGATIVE
GLUCOSE, UA: NEGATIVE mg/dL
HGB URINE DIPSTICK: NEGATIVE
Ketones, ur: NEGATIVE mg/dL
NITRITE: NEGATIVE
Protein, ur: NEGATIVE mg/dL
Specific Gravity, Urine: 1.006 (ref 1.005–1.030)
pH: 5 (ref 5.0–8.0)

## 2015-12-18 LAB — CBC
HEMATOCRIT: 26.4 % — AB (ref 35.0–47.0)
HEMOGLOBIN: 9 g/dL — AB (ref 12.0–16.0)
MCH: 29 pg (ref 26.0–34.0)
MCHC: 34.1 g/dL (ref 32.0–36.0)
MCV: 85.2 fL (ref 80.0–100.0)
Platelets: 378 10*3/uL (ref 150–440)
RBC: 3.1 MIL/uL — ABNORMAL LOW (ref 3.80–5.20)
RDW: 13.9 % (ref 11.5–14.5)
WBC: 9.7 10*3/uL (ref 3.6–11.0)

## 2015-12-18 LAB — GLUCOSE, CAPILLARY
GLUCOSE-CAPILLARY: 67 mg/dL (ref 65–99)
Glucose-Capillary: 94 mg/dL (ref 65–99)

## 2015-12-18 LAB — BRAIN NATRIURETIC PEPTIDE: B Natriuretic Peptide: 186 pg/mL — ABNORMAL HIGH (ref 0.0–100.0)

## 2015-12-18 LAB — TROPONIN I
Troponin I: <0.03 ng/mL (ref <0.03)
Troponin I: <0.03 ng/mL (ref <0.03)

## 2015-12-18 LAB — TSH: TSH: 0.968 u[IU]/mL (ref 0.350–4.500)

## 2015-12-18 MED ORDER — ENOXAPARIN SODIUM 40 MG/0.4ML ~~LOC~~ SOLN
40.0000 mg | SUBCUTANEOUS | Status: DC
  Administered 2015-12-18 – 2015-12-19 (×2): 40 mg via SUBCUTANEOUS
  Filled 2015-12-18 (×2): qty 0.4

## 2015-12-18 MED ORDER — ACETAMINOPHEN 650 MG RE SUPP: 650.0000 mg | Freq: Four times a day (QID) | RECTAL | Status: DC | PRN

## 2015-12-18 MED ORDER — DEXTROSE 5 % IV SOLN
1.0000 g | Freq: Once | INTRAVENOUS | Status: AC
  Administered 2015-12-18: 1 g via INTRAVENOUS
  Filled 2015-12-18: qty 10

## 2015-12-18 MED ORDER — TIOTROPIUM BROMIDE MONOHYDRATE 18 MCG IN CAPS
18.0000 ug | ORAL_CAPSULE | Freq: Every day | RESPIRATORY_TRACT | Status: DC
Start: 2015-12-18 — End: 2015-12-20
  Administered 2015-12-18 – 2015-12-20 (×3): 18 ug via RESPIRATORY_TRACT
  Filled 2015-12-18: qty 5

## 2015-12-18 MED ORDER — DEXTROSE 5 % IV SOLN
INTRAVENOUS | Status: DC
  Administered 2015-12-18: 16:00:00 via INTRAVENOUS

## 2015-12-18 MED ORDER — ACETAMINOPHEN 325 MG PO TABS
650.0000 mg | ORAL_TABLET | Freq: Four times a day (QID) | ORAL | Status: DC | PRN
  Administered 2015-12-19: 10:00:00 650 mg via ORAL
  Filled 2015-12-18: qty 2

## 2015-12-18 MED ORDER — LEVOTHYROXINE SODIUM 100 MCG PO TABS
100.0000 ug | ORAL_TABLET | Freq: Every day | ORAL | Status: DC
  Administered 2015-12-19 – 2015-12-20 (×2): 100 ug via ORAL
  Filled 2015-12-18 (×2): qty 1

## 2015-12-18 MED ORDER — LISINOPRIL 20 MG PO TABS
20.0000 mg | ORAL_TABLET | Freq: Every day | ORAL | Status: DC
  Administered 2015-12-19 – 2015-12-20 (×2): 20 mg via ORAL
  Filled 2015-12-18 (×2): qty 1

## 2015-12-18 MED ORDER — FUROSEMIDE 10 MG/ML IJ SOLN
20.0000 mg | Freq: Two times a day (BID) | INTRAMUSCULAR | Status: DC
  Administered 2015-12-18 – 2015-12-20 (×4): 20 mg via INTRAVENOUS
  Filled 2015-12-18 (×4): qty 2

## 2015-12-18 MED ORDER — ONDANSETRON HCL 4 MG PO TABS: 4.0000 mg | ORAL_TABLET | Freq: Four times a day (QID) | ORAL | Status: DC | PRN

## 2015-12-18 MED ORDER — MOMETASONE FURO-FORMOTEROL FUM 200-5 MCG/ACT IN AERO
2.0000 | INHALATION_SPRAY | Freq: Two times a day (BID) | RESPIRATORY_TRACT | Status: DC
  Administered 2015-12-18 – 2015-12-20 (×4): 2 via RESPIRATORY_TRACT
  Filled 2015-12-18: qty 8.8

## 2015-12-18 MED ORDER — ONDANSETRON HCL 4 MG/2ML IJ SOLN: 4.0000 mg | Freq: Four times a day (QID) | INTRAMUSCULAR | Status: DC | PRN

## 2015-12-18 MED ORDER — DEXTROSE 5 % IV SOLN
1.0000 g | INTRAVENOUS | Status: DC
  Administered 2015-12-19: 1 g via INTRAVENOUS
  Filled 2015-12-18 (×2): qty 10

## 2015-12-18 MED ORDER — CONJ ESTROG-MEDROXYPROGEST ACE 0.625-2.5 MG PO TABS
1.0000 | ORAL_TABLET | Freq: Every day | ORAL | Status: DC
Start: 2015-12-18 — End: 2015-12-18

## 2015-12-18 MED ORDER — PANTOPRAZOLE SODIUM 40 MG PO TBEC
40.0000 mg | DELAYED_RELEASE_TABLET | Freq: Every day | ORAL | Status: DC
  Administered 2015-12-19 – 2015-12-20 (×2): 40 mg via ORAL
  Filled 2015-12-18 (×2): qty 1

## 2015-12-18 MED ORDER — FENOFIBRATE 160 MG PO TABS
160.0000 mg | ORAL_TABLET | Freq: Every day | ORAL | Status: DC
  Administered 2015-12-19 – 2015-12-20 (×2): 160 mg via ORAL
  Filled 2015-12-18 (×2): qty 1

## 2015-12-18 MED ORDER — SODIUM CHLORIDE 0.9% FLUSH
3.0000 mL | Freq: Two times a day (BID) | INTRAVENOUS | Status: DC
  Administered 2015-12-19 – 2015-12-20 (×3): 3 mL via INTRAVENOUS

## 2015-12-18 NOTE — Care Management Obs Status (Signed)
MEDICARE OBSERVATION STATUS NOTIFICATION   Patient Details  Name: Emma Fletcher MRN: [id number] Date of Birth: Sep 25, 1955   Medicare Observation Status Notification Given:  Yes    Caren Macadam, RN 12/18/2015, 2:44 PM

## 2015-12-18 NOTE — ED Notes (Addendum)
Pt states no change to dm meds in the last few years. Takes all her meds in the am. Is on three different dm meds (actos, tradjecta and glemipirade) has been low since last night. Took her am DM meds and was 44 at home. Ate breakfast. bs 67 in room. Given orange juice. No c/o of illness or pain

## 2015-12-18 NOTE — ED Notes (Signed)
Report given. Pt can go to ultrasound and then can go to the floor. 1c with pick up for transport.

## 2015-12-18 NOTE — ED Provider Notes (Addendum)
[facility] Emergency Department Provider Note  ____________________________________________    I have reviewed the triage vital signs and the nursing notes.   HISTORY  Chief Complaint Hypoglycemia    HPI Emma Fletcher is a 60 y.o. female who presents with hyperglycemia. Patient with a history of type 2 diabetes, she has been with her medications. Found to have decreased blood sugar yesterday evening, she drank some orange juice and went to sleep, the next morning her blood sugar was 30 and she was altered, EMS was called and they treated her with IV dextrose, she recovered quickly and refused transport. Later this morning she became shaky and lightheaded and her glucose had dropped back to 44, this point she decided to come to the emergency department. She denies change in her medications. No fevers or chills. She does report some urinary frequency. No abdominal pain. She has never had this problem before.     Past Medical History  Diagnosis Date  . Edema   . Unspecified essential hypertension   . Cardiomyopathy secondary   . Diabetes mellitus without complication (HCC)   . Hypercholesterolemia   . Anemia   . Developmental delay     Patient Active Problem List   Diagnosis Date Noted  . HYPERTENSION, UNSPECIFIED 11/10/2008  . CARDIOMYOPATHY, SECONDARY 11/10/2008  . EDEMA 11/10/2008    History reviewed. No pertinent past surgical history.  Current Outpatient Rx  Name  Route  Sig  Dispense  Refill  . atorvastatin (LIPITOR) 20 MG tablet   Oral   Take 20 mg by mouth daily.           . Choline Fenofibrate (FENOFIBRIC ACID) 135 MG CPDR               . clotrimazole (LOTRIMIN) 1 % cream   Topical   Apply 1 application topically 2 (two) times daily.   30 g   0   . estrogen, conjugated,-medroxyprogesterone (PREMPRO) 0.625-2.5 MG per tablet   Oral   Take 1 tablet by mouth daily.           . furosemide (LASIX) 20 MG tablet   Oral    Take 20 mg by mouth daily.           Marland Kitchen glimepiride (AMARYL) 2 MG tablet               . lansoprazole (PREVACID) 30 MG capsule               . levothyroxine (SYNTHROID, LEVOTHROID) 100 MCG tablet   Oral   Take 100 mcg by mouth daily.           Marland Kitchen lisinopril (PRINIVIL,ZESTRIL) 20 MG tablet   Oral   Take 20 mg by mouth daily.           . pioglitazone (ACTOS) 15 MG tablet               . potassium chloride (KLOR-CON) 10 MEQ CR tablet   Oral   Take 10 mEq by mouth daily.           Marland Kitchen SPIRIVA HANDIHALER 18 MCG inhalation capsule                 Dispense as written.   . TRADJENTA 5 MG TABS tablet                 Dispense as written.     Allergies Review of patient's allergies indicates no known allergies.  Family History  Problem Relation Age of Onset  . Coronary artery disease Other   . Stroke Other     CVA    Social History Social History  Substance Use Topics  . Smoking status: Never Smoker   . Smokeless tobacco: Never Used  . Alcohol Use: No    Review of Systems  Constitutional: Negative for fever. Eyes: Negative for redness ENT: Negative for sore throat Cardiovascular: Negative for chest pain Respiratory: Negative for shortness of breath. Gastrointestinal: Negative for abdominal pain Genitourinary: Negative for dysuria. Musculoskeletal: Negative for back pain. Skin: Negative for rash. Neurological: Negative for focal weakness Psychiatric: no anxiety    ____________________________________________   PHYSICAL EXAM:  VITAL SIGNS: ED Triage Vitals  Enc Vitals Group     BP 12/18/15 1203 115/55 mmHg     Pulse Rate 12/18/15 1203 81     Resp 12/18/15 1203 20     Temp 12/18/15 1203 98.8 F (37.1 C)     Temp Source 12/18/15 1203 Oral     SpO2 12/18/15 1203 100 %     Weight 12/18/15 1203 239 lb (108.41 kg)     Height 12/18/15 1203 5\' 6"  (1.676 m)     Head Cir --      Peak Flow --      Pain Score 12/18/15 1206 2     Pain  Loc --      Pain Edu? --      Excl. in GC? --      Constitutional: Alert and oriented. Well appearing and in no distress.  Eyes: Conjunctivae are normal. No erythema or injection ENT   Head: Normocephalic and atraumatic.   Mouth/Throat: Mucous membranes are moist. Cardiovascular: Normal rate, regular rhythm. Normal and symmetric distal pulses are present in the upper extremities.  Respiratory: Normal respiratory effort without tachypnea nor retractions. Breath sounds are clear and equal bilaterally.  Gastrointestinal: Soft and non-tender in all quadrants. No distention. There is no CVA tenderness. Genitourinary: deferred Musculoskeletal: Nontender with normal range of motion in all extremities. No lower extremity tenderness nor edema. Neurologic:  Patient with chronic speech impediment. No gross focal neurologic deficits are appreciated. Skin:  Skin is warm, dry and intact. No rash noted. Psychiatric: Mood and affect are normal. Patient exhibits appropriate insight and judgment.  ____________________________________________    LABS (pertinent positives/negatives)  Labs Reviewed  CBC - Abnormal; Notable for the following:    RBC 3.10 (*)    Hemoglobin 9.0 (*)    HCT 26.4 (*)    All other components within normal limits  COMPREHENSIVE METABOLIC PANEL - Abnormal; Notable for the following:    Sodium 134 (*)    BUN 29 (*)    Creatinine, Ser 1.29 (*)    GFR calc non Af Amer 44 (*)    GFR calc Af Amer 51 (*)    All other components within normal limits  GLUCOSE, CAPILLARY  TROPONIN I  URINALYSIS COMPLETEWITH MICROSCOPIC ([facility] ONLY)    ____________________________________________   EKG  ED ECG REPORT I, Jene Every, the attending physician, personally viewed and interpreted this ECG.  Date: 12/18/2015 EKG Time: 2:13 PM Rate: 81 Rhythm: normal sinus rhythm QRS Axis: normal Intervals: normal ST/T Wave abnormalities: normal Conduction Disturbances:  none Narrative Interpretation: unremarkable   ____________________________________________    RADIOLOGY  cxr nad  ____________________________________________   PROCEDURES  Procedure(s) performed: none  Critical Care performed: none  ____________________________________________   INITIAL IMPRESSION / ASSESSMENT AND PLAN / ED COURSE  Pertinent labs & imaging results that were available during my care of the patient were reviewed by me and considered in my medical decision making (see chart for details).  Patient with multiple episodes of hypoglycemia for unclear reasons. We will check labs,urine, cxr and monitor closely.   Patient with UTI, IV rocephin. I will admit the patient for recurrent hypoglycemia  ____________________________________________   FINAL CLINICAL IMPRESSION(S) / ED DIAGNOSES  Final diagnoses:  Hypoglycemia  UTI (lower urinary tract infection)          Jene Every, MD 12/18/15 1358  Jene Every, MD 12/18/15 1416

## 2015-12-18 NOTE — H&P (Signed)
[facility] - [place] at [facility]   PATIENT NAME: Emma Fletcher    MR#:  [id number]  DATE OF BIRTH:  [birth year]  DATE OF ADMISSION:  12/18/2015  PRIMARY CARE PHYSICIAN: Evelene Croon, MD   REQUESTING/REFERRING PHYSICIAN:   CHIEF COMPLAINT:   Chief Complaint  Patient presents with  . Hypoglycemia    HISTORY OF PRESENT ILLNESS: Emma Fletcher  is a 60 y.o. female with a known history of lower extremity edema, essential hypertension, secondary cardiomyopathy, diabetes mellitus type II, hyperlipidemia, developmental delay who presents to the hospital with few hypoglycemia episodes today. The patient is on oral medications, yesterday she was noted to have blood glucose level of 40. She was given orange juice and went to sleep. She woke up in the morning at around 2 AM, jittery, her blood glucose level was again low. She also had altered mental status, EMS was called and dextrose was administered. She felt jittery, presyncopal and diaphoretic during those episodes of hypoglycemia. On arrival to emergency room, patient was noted to have urinary tract infection. Patient complains of right flank pain, concerning for acute pyelonephritis, however, denies any fevers or chills recently. The patient's mother. She has not been eating well over the past day or 2. Hospitalist services were contacted for admission  PAST MEDICAL HISTORY:   Past Medical History  Diagnosis Date  . Edema   . Unspecified essential hypertension   . Cardiomyopathy secondary   . Diabetes mellitus without complication (HCC)   . Hypercholesterolemia   . Anemia   . Developmental delay     PAST SURGICAL HISTORY: History reviewed. No pertinent past surgical history.  SOCIAL HISTORY:  Social History  Substance Use Topics  . Smoking status: Never Smoker   . Smokeless tobacco: Never Used  . Alcohol Use: No    FAMILY HISTORY:  Family History  Problem Relation Age of Onset  . Coronary artery  disease Other   . Stroke Other     CVA    DRUG ALLERGIES: No Known Allergies  Review of Systems  Constitutional: Negative for fever, chills, weight loss and malaise/fatigue.  HENT: Negative for congestion.   Eyes: Negative for blurred vision and double vision.  Respiratory: Negative for cough, sputum production, shortness of breath and wheezing.   Cardiovascular: Negative for chest pain, palpitations, orthopnea, leg swelling and PND.  Gastrointestinal: Negative for nausea, vomiting, abdominal pain, diarrhea, constipation, blood in stool and melena.  Genitourinary: Positive for flank pain. Negative for dysuria, urgency, frequency and hematuria.  Musculoskeletal: Negative for falls.  Skin: Negative for rash.  Neurological: Negative for dizziness and weakness.  Psychiatric/Behavioral: Negative for depression and memory loss. The patient is not nervous/anxious.     MEDICATIONS AT HOME:  Prior to Admission medications   Medication Sig Start Date End Date Taking? Authorizing Provider  budesonide-formoterol (SYMBICORT) 160-4.5 MCG/ACT inhaler Inhale 2 puffs into the lungs 2 (two) times daily.   Yes Historical Provider, MD  Calcium Carb-Cholecalciferol (CALCIUM 600+D) 600-800 MG-UNIT TABS Take 1 tablet by mouth daily.   Yes Historical Provider, MD  Choline Fenofibrate (FENOFIBRIC ACID) 135 MG CPDR Take 135 mg by mouth daily.  07/19/15  Yes Historical Provider, MD  estrogen, conjugated,-medroxyprogesterone (PREMPRO) 0.625-2.5 MG per tablet Take 1 tablet by mouth daily.     Yes Historical Provider, MD  furosemide (LASIX) 20 MG tablet Take 20 mg by mouth daily.     Yes Historical Provider, MD  glimepiride (AMARYL) 2 MG tablet Take 2 mg by  mouth daily after breakfast.  06/18/15  Yes Historical Provider, MD  Iron, Ferrous Sulfate, 142 (45 Fe) MG TBCR Take 90 mg of iron by mouth daily.   Yes Historical Provider, MD  lansoprazole (PREVACID) 30 MG capsule Take 30 mg by mouth daily.  06/01/15  Yes  Historical Provider, MD  levothyroxine (SYNTHROID, LEVOTHROID) 100 MCG tablet Take 100 mcg by mouth daily.     Yes Historical Provider, MD  lisinopril (PRINIVIL,ZESTRIL) 20 MG tablet Take 20 mg by mouth daily.     Yes Historical Provider, MD  Multiple Vitamins-Minerals (ONE-A-DAY WOMENS 50 PLUS PO) Take 1 tablet by mouth daily.   Yes Historical Provider, MD  pioglitazone (ACTOS) 15 MG tablet Take 15 mg by mouth daily.  06/18/15  Yes Historical Provider, MD  potassium chloride (KLOR-CON) 10 MEQ CR tablet Take 10 mEq by mouth daily.     Yes Historical Provider, MD  SPIRIVA HANDIHALER 18 MCG inhalation capsule Place 18 mcg into inhaler and inhale daily.  07/19/15  Yes Historical Provider, MD  TRADJENTA 5 MG TABS tablet Take 5 mg by mouth daily.  07/19/15  Yes Historical Provider, MD  vitamin C (ASCORBIC ACID) 500 MG tablet Take 500 mg by mouth daily.   Yes Historical Provider, MD  clotrimazole (LOTRIMIN) 1 % cream Apply 1 application topically 2 (two) times daily. 04/23/15   Asencion Islam, DPM      PHYSICAL EXAMINATION:   VITAL SIGNS: Blood pressure 115/55, pulse 81, temperature 98.8 F (37.1 C), temperature source Oral, resp. rate 20, height 5\' 6"  (1.676 m), weight 108.41 kg (239 lb), SpO2 100 %.  GENERAL:  60 y.o.-year-old patient lying in the bed with no acute distress. Some slurry speech,  Patient is looking to her mother for answers intermittently.  EYES: Pupils equal, round, reactive to light and accommodation. No scleral icterus. Extraocular muscles intact.  HEENT: Head atraumatic, normocephalic. Oropharynx and nasopharynx clear.  NECK:  Supple, no jugular venous distention. No thyroid enlargement, no tenderness.  LUNGS: Normal breath sounds bilaterally, no wheezing, but bilateral rales,rhonchi were heard posteriorly and anteriorly, no crepitations. No use of accessory muscles of respiration.  CARDIOVASCULAR: S1, S2 , rhythm is regular. , 3/6 systolic murmur was heard with some radiation to  left axilla, no rubs, or gallops.  ABDOMEN: Soft, nontender, nondistended. Bowel sounds present. No organomegaly or mass. No CVA tenderness on percussion posteriorly bilaterally EXTREMITIES: 1-2+ lower extremity and pedal edema, no calf tenderness,  cyanosis, or clubbing.  NEUROLOGIC: Cranial nerves II through XII are intact. Muscle strength 5/5 in all extremities. Sensation intact. Gait not checked.  PSYCHIATRIC: The patient is alert and oriented x 3. Has difficulty answering questions, looks for answers to her mother intermittently, slow to respond.  SKIN: No obvious rash, lesion, or ulcer.   LABORATORY PANEL:   CBC  Recent Labs Lab 12/18/15 1254  WBC 9.7  HGB 9.0*  HCT 26.4*  PLT 378  MCV 85.2  MCH 29.0  MCHC 34.1  RDW 13.9   ------------------------------------------------------------------------------------------------------------------  Chemistries   Recent Labs Lab 12/18/15 1254  NA 134*  K 4.9  CL 104  CO2 23  GLUCOSE 69  BUN 29*  CREATININE 1.29*  CALCIUM 9.8  AST 33  ALT 30  ALKPHOS 71  BILITOT 0.3   ------------------------------------------------------------------------------------------------------------------  Cardiac Enzymes  Recent Labs Lab 12/18/15 1254  TROPONINI <0.03   ------------------------------------------------------------------------------------------------------------------  RADIOLOGY: Dg Chest 2 View  12/18/2015  CLINICAL DATA:  Weakness EXAM: CHEST  2 VIEW COMPARISON:  None. FINDINGS: Cardiac shadow is within normal limits. The lungs are clear bilaterally. Degenerative change of thoracic spine is noted. IMPRESSION: No active cardiopulmonary disease. Electronically Signed   By: Alcide Clever M.D.   On: 12/18/2015 13:16    EKG: Orders placed or performed during the hospital encounter of 12/18/15  . ED EKG  . ED EKG    IMPRESSION AND PLAN:  Principal Problem:   Hypoglycemia associated with diabetes (HCC) Active Problems:    Acute pyelonephritis   Hyponatremia   Acute renal insufficiency   Anemia   Hypoglycemia #1. Hypoglycemia associated with diabetes, admit patient to medical floor, hold diabetic medications, initiated her on D5 water solution, follow oral intake, check Accu-Cheks, reinitiate diabetic medications depending on blood glucose levels and oral intake, get hemoglobin A1c #2. Acute pyelonephritis, initiate Rocephin, good urinary cultures, just antibiotics depending on culture results #3. Hyponatremia and fluid overloaded. Patient, continue diuretics, follow sodium level tomorrow morning #4 acute renal insufficiency,  get renal ultrasound, follow with therapy #5. Anemia, get Hemoccult #6. Acute CHF?, although chest x-ray showed no fluid retention, get echocardiogram, continue diuretics, follow clinically, get BNP  All the records are reviewed and case discussed with ED provider. Management plans discussed with the patient, family and they are in agreement.  CODE STATUS: Code Status History    This patient does not have a recorded code status. Please follow your organizational policy for patients in this situation.       TOTAL TIME TAKING CARE OF THIS PATIENT: 50 minutes.    Katharina Caper M.D on 12/18/2015 at 2:35 PM  Between 7am to 6pm - Pager - [phone] After 6pm go to [email or web address] - password [email or web address] [facility]  [facility]  Office  [phone]  CC: Primary care physician; Evelene Croon, MD

## 2015-12-18 NOTE — ED Notes (Signed)
States last night blood sugar dropped to 50, drank orange juice, today sister checked blood sugar and noted it to be 30 approx 0130, drank oragne juice and then EMS came and trated and before their departure it was 75, asymptomatic at that time. She refused transport. This am FSBS 134, at breakfast, took regular meds, about 1030 noted shaky and lightheaded, took FSBS 44, given eggs and coke and now asymptomatic. No recent change in meds.

## 2015-12-18 NOTE — Progress Notes (Signed)
*  PRELIMINARY RESULTS* Echocardiogram 2D Echocardiogram has been performed.  Emma Fletcher 12/18/2015, 5:37 PM

## 2015-12-18 NOTE — ED Notes (Signed)
Pt up on bedside eating some graham crackers, p butter and milk

## 2015-12-19 DIAGNOSIS — E11649 Type 2 diabetes mellitus with hypoglycemia without coma: Secondary | ICD-10-CM | POA: Diagnosis not present

## 2015-12-19 LAB — BASIC METABOLIC PANEL
ANION GAP: 4 — AB (ref 5–15)
BUN: 29 mg/dL — ABNORMAL HIGH (ref 6–20)
CALCIUM: 9.1 mg/dL (ref 8.9–10.3)
CHLORIDE: 102 mmol/L (ref 101–111)
CO2: 28 mmol/L (ref 22–32)
CREATININE: 1.29 mg/dL — AB (ref 0.44–1.00)
GFR calc non Af Amer: 44 mL/min — ABNORMAL LOW (ref >60)
GFR, EST AFRICAN AMERICAN: 51 mL/min — AB (ref >60)
Glucose, Bld: 64 mg/dL — ABNORMAL LOW (ref 65–99)
Potassium: 4.6 mmol/L (ref 3.5–5.1)
SODIUM: 134 mmol/L — AB (ref 135–145)

## 2015-12-19 LAB — GLUCOSE, CAPILLARY
GLUCOSE-CAPILLARY: 202 mg/dL — AB (ref 65–99)
GLUCOSE-CAPILLARY: 62 mg/dL — AB (ref 65–99)
GLUCOSE-CAPILLARY: 146 mg/dL — AB (ref 65–99)
GLUCOSE-CAPILLARY: 184 mg/dL — AB (ref 65–99)
GLUCOSE-CAPILLARY: 263 mg/dL — AB (ref 65–99)
Glucose-Capillary: 60 mg/dL — ABNORMAL LOW (ref 65–99)
Glucose-Capillary: 225 mg/dL — ABNORMAL HIGH (ref 65–99)

## 2015-12-19 LAB — CBC
HCT: 24.8 % — ABNORMAL LOW (ref 35.0–47.0)
HEMOGLOBIN: 8.3 g/dL — AB (ref 12.0–16.0)
MCH: 28.4 pg (ref 26.0–34.0)
MCHC: 33.4 g/dL (ref 32.0–36.0)
MCV: 84.9 fL (ref 80.0–100.0)
Platelets: 355 10*3/uL (ref 150–440)
RBC: 2.93 MIL/uL — AB (ref 3.80–5.20)
RDW: 13.9 % (ref 11.5–14.5)
WBC: 6.7 10*3/uL (ref 3.6–11.0)

## 2015-12-19 LAB — ECHOCARDIOGRAM COMPLETE
AOASC: 30 cm
CHL CUP MV DEC (S): 169
E decel time: 169 msec
FS: 29 % (ref 28–44)
Height: 66 in
IV/PV OW: 0.86
LA diam end sys: 49 mm
LA vol A4C: 61.3 ml
LADIAMINDEX: 2.27 cm/m2
LASIZE: 49 mm
LDCA: 2.54 cm2
LV PW d: 11.7 mm — AB (ref 0.6–1.1)
LVOT diameter: 18 mm
MVPG: 3 mmHg
MVPKAVEL: 90.2 m/s
MVPKEVEL: 86.8 m/s
WEIGHTICAEL: 3824 [oz_av]

## 2015-12-19 LAB — OCCULT BLOOD X 1 CARD TO LAB, STOOL: FECAL OCCULT BLD: NEGATIVE

## 2015-12-19 LAB — HEMOGLOBIN A1C: Hgb A1c MFr Bld: 6.2 % — ABNORMAL HIGH (ref 4.0–6.0)

## 2015-12-19 LAB — TROPONIN I

## 2015-12-19 MED ORDER — INSULIN ASPART 100 UNIT/ML ~~LOC~~ SOLN: 0.0000 [IU] | Freq: Three times a day (TID) | SUBCUTANEOUS | Status: DC

## 2015-12-19 MED ORDER — GLIMEPIRIDE 2 MG PO TABS
2.0000 mg | ORAL_TABLET | Freq: Every day | ORAL | Status: DC
  Administered 2015-12-20: 08:00:00 2 mg via ORAL
  Filled 2015-12-19: qty 1

## 2015-12-19 MED ORDER — INSULIN ASPART 100 UNIT/ML ~~LOC~~ SOLN
0.0000 [IU] | Freq: Every day | SUBCUTANEOUS | Status: DC
  Administered 2015-12-19: 21:00:00 2 [IU] via SUBCUTANEOUS
  Filled 2015-12-19: qty 2

## 2015-12-19 MED ORDER — DEXTROSE 50 % IV SOLN
1.0000 | Freq: Once | INTRAVENOUS | Status: AC
  Administered 2015-12-19: 06:00:00 50 mL via INTRAVENOUS
  Filled 2015-12-19: qty 50

## 2015-12-19 MED ORDER — PIOGLITAZONE HCL 30 MG PO TABS
15.0000 mg | ORAL_TABLET | Freq: Every day | ORAL | Status: DC
  Administered 2015-12-20: 15 mg via ORAL
  Filled 2015-12-19: qty 1

## 2015-12-19 MED ORDER — INSULIN ASPART 100 UNIT/ML ~~LOC~~ SOLN
0.0000 [IU] | Freq: Three times a day (TID) | SUBCUTANEOUS | Status: DC
  Administered 2015-12-19: 5 [IU] via SUBCUTANEOUS
  Administered 2015-12-20: 1 [IU] via SUBCUTANEOUS
  Administered 2015-12-20: 12:00:00 2 [IU] via SUBCUTANEOUS
  Filled 2015-12-19: qty 5
  Filled 2015-12-19: qty 1
  Filled 2015-12-19: qty 2

## 2015-12-19 NOTE — Progress Notes (Signed)
[facility] - [place] at [facility]   PATIENT NAME: Emma Fletcher    MR#:  [id number]  DATE OF BIRTH:  Feb 12, 1956  SUBJECTIVE:  CHIEF COMPLAINT:   Chief Complaint  Patient presents with  . Hypoglycemia     Came with hypoglycemia- Now BS stable on Dextrose drip.   UTI.   No recent change in her Diabetic meds.    No complains now.  REVIEW OF SYSTEMS:  CONSTITUTIONAL: No fever, fatigue or weakness.  EYES: No blurred or double vision.  EARS, NOSE, AND THROAT: No tinnitus or ear pain.  RESPIRATORY: No cough, shortness of breath, wheezing or hemoptysis.  CARDIOVASCULAR: No chest pain, orthopnea, edema.  GASTROINTESTINAL: No nausea, vomiting, diarrhea or abdominal pain.  GENITOURINARY: No dysuria, hematuria.  ENDOCRINE: No polyuria, nocturia,  HEMATOLOGY: No anemia, easy bruising or bleeding SKIN: No rash or lesion. MUSCULOSKELETAL: No joint pain or arthritis.   NEUROLOGIC: No tingling, numbness, weakness.  PSYCHIATRY: No anxiety or depression.   ROS  DRUG ALLERGIES:  No Known Allergies  VITALS:  Blood pressure 109/63, pulse 79, temperature 98.6 F (37 C), temperature source Oral, resp. rate 18, height  (1.676 m), weight 108.41 kg (239 lb), SpO2 100 %.  PHYSICAL EXAMINATION:  GENERAL:  60 y.o.-year-old patient lying in the bed with no acute distress.  EYES: Pupils equal, round, reactive to light and accommodation. No scleral icterus. Extraocular muscles intact.  HEENT: Head atraumatic, normocephalic. Oropharynx and nasopharynx clear.  NECK:  Supple, no jugular venous distention. No thyroid enlargement, no tenderness.  LUNGS: Normal breath sounds bilaterally, no wheezing, rales,rhonchi or crepitation. No use of accessory muscles of respiration.  CARDIOVASCULAR: S1, S2 normal. No murmurs, rubs, or gallops.  ABDOMEN: Soft, nontender, nondistended. Bowel sounds present. No organomegaly or mass.  EXTREMITIES: No pedal edema, cyanosis, or clubbing.   NEUROLOGIC: Cranial nerves II through XII are intact. Muscle strength 5/5 in all extremities. Sensation intact. Gait not checked.  PSYCHIATRIC: The patient is alert and oriented x 3.  SKIN: No obvious rash, lesion, or ulcer.   Physical Exam LABORATORY PANEL:   CBC  Recent Labs Lab 12/19/15 0401  WBC 6.7  HGB 8.3*  HCT 24.8*  PLT 355   ------------------------------------------------------------------------------------------------------------------  Chemistries   Recent Labs Lab 12/18/15 1254 12/19/15 0401  NA 134* 134*  K 4.9 4.6  CL 104 102  CO2 23 28  GLUCOSE 69 64*  BUN 29* 29*  CREATININE 1.29* 1.29*  CALCIUM 9.8 9.1  AST 33  --   ALT 30  --   ALKPHOS 71  --   BILITOT 0.3  --    ------------------------------------------------------------------------------------------------------------------  Cardiac Enzymes  Recent Labs Lab 12/18/15 2223 12/19/15 0401  TROPONINI <0.03 <0.03   ------------------------------------------------------------------------------------------------------------------  RADIOLOGY:  Dg Chest 2 View  12/18/2015  CLINICAL DATA:  Weakness EXAM: CHEST  2 VIEW COMPARISON:  None. FINDINGS: Cardiac shadow is within normal limits. The lungs are clear bilaterally. Degenerative change of thoracic spine is noted. IMPRESSION: No active cardiopulmonary disease. Electronically Signed   By: Alcide Clever M.D.   On: 12/18/2015 13:16   US Renal  12/18/2015  CLINICAL DATA:  Renal insufficiency EXAM: RENAL / URINARY TRACT ULTRASOUND COMPLETE COMPARISON:  None. FINDINGS: Right Kidney: Length: 10 cm. Echogenicity within normal limits. No mass or hydronephrosis visualized. Left Kidney: Length: 9.8 cm. Echogenicity within normal limits. No mass or hydronephrosis visualized. Bladder: Appears normal for degree of bladder distention. IMPRESSION: No acute abnormality noted. Electronically Signed   By:  Alcide Clever M.D.   On: 12/18/2015 15:50    ASSESSMENT AND  PLAN:   Principal Problem:   Hypoglycemia associated with diabetes (HCC) Active Problems:   Hyponatremia   Acute renal insufficiency   Anemia   Acute pyelonephritis   Hypoglycemia   #1. Hypoglycemia associated with diabetes,   hold diabetic medications, Kept on D5 water solution, follow oral intake, check Accu-Cheks, reinitiate diabetic medications depending on blood glucose levels and oral intake, normal hemoglobin A1c   BS now > 100, stopped dextrose drip.    May d/c one of her oral meds for DM on discharge. #2. Acute pyelonephritis,    IV Rocephin, sent urinary cultures,adjust antibiotics depending on culture results #3. Hyponatremia and fluid overloaded. Patient, continue diuretics, sodium stable. #4 acute renal insufficiency, get renal ultrasound, follow with therapy #5. Anemia, negative Hemoccult #6. No signs of CHF.    All the records are reviewed and case discussed with Care Management/Social Workerr. Management plans discussed with the patient, family and they are in agreement.  CODE STATUS: full.  TOTAL TIME TAKING CARE OF THIS PATIENT: 35 minutes.   Her sister was present in room.  POSSIBLE D/C IN 1-2 DAYS, DEPENDING ON CLINICAL CONDITION.   Altamese Dilling M.D on 12/19/2015   Between 7am to 6pm - Pager - [phone]  After 6pm go to [email or web address] - password [email or web address]  [facility]  Office  [phone]  CC: Primary care physician; Evelene Croon, MD  Note: This dictation was prepared with Dragon dictation along with smaller phrase technology. Any transcriptional errors that result from this process are unintentional.

## 2015-12-19 NOTE — Progress Notes (Signed)
Labs revealed serum glucose of 64.  FSBS resulted at 60.  Gave Pt 120 ml OJ.  Will recheck and monitor.

## 2015-12-20 DIAGNOSIS — E11649 Type 2 diabetes mellitus with hypoglycemia without coma: Secondary | ICD-10-CM | POA: Diagnosis not present

## 2015-12-20 LAB — GLUCOSE, CAPILLARY
GLUCOSE-CAPILLARY: 140 mg/dL — AB (ref 65–99)
GLUCOSE-CAPILLARY: 179 mg/dL — AB (ref 65–99)

## 2015-12-20 MED ORDER — CEFUROXIME AXETIL 250 MG PO TABS: 250.0000 mg | ORAL_TABLET | Freq: Two times a day (BID) | ORAL | Status: DC

## 2015-12-20 NOTE — Progress Notes (Signed)
Inpatient Diabetes Program Recommendations  AACE/ADA: New Consensus Statement on Inpatient Glycemic Control (2015)  Target Ranges:  Prepandial:   less than 140 mg/dL      Peak postprandial:   less than 180 mg/dL (1-2 hours)      Critically ill patients:  140 - 180 mg/dL   Lab Results  Component Value Date   GLUCAP 140* 12/20/2015   HGBA1C 6.2* 12/18/2015    Review of Glycemic Control-Note that patient admitted with hypoglycemia  Diabetes history: Type 2 diabetes Outpatient Diabetes medications: Tradjenta 5 mg daily, Amaryl 2 mg daily, Actos 15 mg daily Current orders for Inpatient glycemic control:  Actos 15 mg daily, Amaryl 2 mg daily, Tradjenta 5 mg daily, Novolog sensitive tid with meals and HS  Inpatient Diabetes Program Recommendations:    Please consider d/c of Amaryl at discharge due to history of low blood sugars.  Will need f/u with PCP and close monitoring of CBG's at d/c also.  Thanks, Beryl Meager, RN, BC-ADM Inpatient Diabetes Coordinator Pager [phone] (8a-5p)

## 2015-12-20 NOTE — Progress Notes (Signed)
Discussed discharge instructions and medications with pt and her sister.  IV removed. No questions at this time.  Pt transported home via car by sister.  Orson Ape, RN

## 2015-12-20 NOTE — Care Management Important Message (Signed)
Important Message  Patient Details  Name: WILL SCHMELTZER MRN: [id number] Date of Birth: 1956/01/31   Medicare Important Message Given:  Yes    Shateria Paternostro A, RN 12/20/2015, 7:18 AM

## 2015-12-20 NOTE — Discharge Summary (Signed)
[facility] -  [facility]   PATIENT NAME: Emma Fletcher    MR#:  [id number]  DATE OF BIRTH:  September 04, 1955  DATE OF ADMISSION:  12/18/2015 ADMITTING PHYSICIAN: Katharina Caper, MD  DATE OF DISCHARGE: 12/20/2015 12:30 PM  PRIMARY CARE PHYSICIAN: Evelene Croon, MD    ADMISSION DIAGNOSIS:  Hypoglycemia [E16.2] UTI (lower urinary tract infection) [N39.0] Renal insufficiency [N28.9]  DISCHARGE DIAGNOSIS:  Principal Problem:   Hypoglycemia associated with diabetes (HCC) Active Problems:   Hyponatremia   Acute renal insufficiency   Anemia   Acute pyelonephritis   Hypoglycemia   SECONDARY DIAGNOSIS:   Past Medical History  Diagnosis Date  . Edema   . Unspecified essential hypertension   . Cardiomyopathy secondary   . Diabetes mellitus without complication (HCC)   . Hypercholesterolemia   . Anemia   . Developmental delay     HOSPITAL COURSE:   #1. Hypoglycemia associated with diabetes,  hold diabetic medications, Kept on D5 water solution, follow oral intake, check Accu-Cheks, reinitiate diabetic medications depending on blood glucose levels and oral intake, normal hemoglobin A1c  BS now > 100, stopped dextrose drip.  as blood sugar came up to 200- resume glipizide and actose, but stop trajenta.   Advised to check blood sugar 3 times daily for next 2 weeks and visit PMD. #2. Acute pyelonephritis,   IV Rocephin, sent urinary cultures,growing 100 k CFU E coli, but no sensitivities back yet.   Pt is feeling completely fine, so will give oral cephalosporin. #3. Hyponatremia and fluid overloaded. Patient, continue diuretics, sodium stable. #4 acute renal insufficiency, get renal ultrasound, follow with therapy #5. Anemia, negative Hemoccult #6. No signs of CHF.  DISCHARGE CONDITIONS:   Stable.  CONSULTS OBTAINED:     DRUG ALLERGIES:  No Known Allergies  DISCHARGE MEDICATIONS:   Discharge Medication List as of 12/20/2015  11:43 AM    START taking these medications   Details  cefUROXime (CEFTIN) 250 MG tablet Take 1 tablet (250 mg total) by mouth 2 (two) times daily with a meal., Starting 12/20/2015, Until Discontinued, Print      CONTINUE these medications which have NOT CHANGED   Details  budesonide-formoterol (SYMBICORT) 160-4.5 MCG/ACT inhaler Inhale 2 puffs into the lungs 2 (two) times daily., Until Discontinued, Historical Med    Calcium Carb-Cholecalciferol (CALCIUM 600+D) 600-800 MG-UNIT TABS Take 1 tablet by mouth daily., Until Discontinued, Historical Med    Choline Fenofibrate (FENOFIBRIC ACID) 135 MG CPDR Take 135 mg by mouth daily. , Starting 07/19/2015, Until Discontinued, Historical Med    estrogen, conjugated,-medroxyprogesterone (PREMPRO) 0.625-2.5 MG per tablet Take 1 tablet by mouth daily.  , Until Discontinued, Historical Med    furosemide (LASIX) 20 MG tablet Take 20 mg by mouth daily.  , Until Discontinued, Historical Med    glimepiride (AMARYL) 2 MG tablet Take 2 mg by mouth daily after breakfast. , Starting 06/18/2015, Until Discontinued, Historical Med    Iron, Ferrous Sulfate, 142 (45 Fe) MG TBCR Take 90 mg of iron by mouth daily., Until Discontinued, Historical Med    lansoprazole (PREVACID) 30 MG capsule Take 30 mg by mouth daily. , Starting 06/01/2015, Until Discontinued, Historical Med    levothyroxine (SYNTHROID, LEVOTHROID) 100 MCG tablet Take 100 mcg by mouth daily.  , Until Discontinued, Historical Med    lisinopril (PRINIVIL,ZESTRIL) 20 MG tablet Take 20 mg by mouth daily.  , Until Discontinued, Historical Med    Multiple Vitamins-Minerals (ONE-A-DAY WOMENS 50 PLUS PO) Take 1  tablet by mouth daily., Until Discontinued, Historical Med    pioglitazone (ACTOS) 15 MG tablet Take 15 mg by mouth daily. , Starting 06/18/2015, Until Discontinued, Historical Med    potassium chloride (KLOR-CON) 10 MEQ CR tablet Take 10 mEq by mouth daily.  , Until Discontinued, Historical Med     SPIRIVA HANDIHALER 18 MCG inhalation capsule Place 18 mcg into inhaler and inhale daily. , Starting 07/19/2015, Until Discontinued, Historical Med    vitamin C (ASCORBIC ACID) 500 MG tablet Take 500 mg by mouth daily., Until Discontinued, Historical Med    clotrimazole (LOTRIMIN) 1 % cream Apply 1 application topically 2 (two) times daily., Starting 04/23/2015, Until Discontinued, Normal      STOP taking these medications     TRADJENTA 5 MG TABS tablet          DISCHARGE INSTRUCTIONS:    Follow with PMD for blood glucose level and diabetic medication change.  If you experience worsening of your admission symptoms, develop shortness of breath, life threatening emergency, suicidal or homicidal thoughts you must seek medical attention immediately by calling 911 or calling your MD immediately  if symptoms less severe.  You Must read complete instructions/literature along with all the possible adverse reactions/side effects for all the Medicines you take and that have been prescribed to you. Take any new Medicines after you have completely understood and accept all the possible adverse reactions/side effects.   Please note  You were cared for by a hospitalist during your hospital stay. If you have any questions about your discharge medications or the care you received while you were in the hospital after you are discharged, you can call the unit and asked to speak with the hospitalist on call if the hospitalist that took care of you is not available. Once you are discharged, your primary care physician will handle any further medical issues. Please note that NO REFILLS for any discharge medications will be authorized once you are discharged, as it is imperative that you return to your primary care physician (or establish a relationship with a primary care physician if you do not have one) for your aftercare needs so that they can reassess your need for medications and monitor your lab  values.    Today   CHIEF COMPLAINT:   Chief Complaint  Patient presents with  . Hypoglycemia    HISTORY OF PRESENT ILLNESS:  Emma Fletcher  is a 60 y.o. female with a known history of lower extremity edema, essential hypertension, secondary cardiomyopathy, diabetes mellitus type II, hyperlipidemia, developmental delay who presents to the hospital with few hypoglycemia episodes today. The patient is on oral medications, yesterday she was noted to have blood glucose level of 40. She was given orange juice and went to sleep. She woke up in the morning at around 2 AM, jittery, her blood glucose level was again low. She also had altered mental status, EMS was called and dextrose was administered. She felt jittery, presyncopal and diaphoretic during those episodes of hypoglycemia. On arrival to emergency room, patient was noted to have urinary tract infection. Patient complains of right flank pain, concerning for acute pyelonephritis, however, denies any fevers or chills recently. The patient's mother. She has not been eating well over the past day or 2. Hospitalist services were contacted for admission   VITAL SIGNS:  Blood pressure 118/62, pulse 73, temperature 97.8 F (36.6 C), temperature source Oral, resp. rate 18, height 5\' 6"  (1.676 m), weight 108.41 kg (239 lb), SpO2 98 %.  I/O:   Intake/Output Summary (Last 24 hours) at 12/20/15 1317 Last data filed at 12/19/15 1800  Gross per 24 hour  Intake    240 ml  Output      0 ml  Net    240 ml    PHYSICAL EXAMINATION:  GENERAL:  60 y.o.-year-old patient lying in the bed with no acute distress.  EYES: Pupils equal, round, reactive to light and accommodation. No scleral icterus. Extraocular muscles intact.  HEENT: Head atraumatic, normocephalic. Oropharynx and nasopharynx clear.  NECK:  Supple, no jugular venous distention. No thyroid enlargement, no tenderness.  LUNGS: Normal breath sounds bilaterally, no wheezing, rales,rhonchi or  crepitation. No use of accessory muscles of respiration.  CARDIOVASCULAR: S1, S2 normal. No murmurs, rubs, or gallops.  ABDOMEN: Soft, non-tender, non-distended. Bowel sounds present. No organomegaly or mass.  EXTREMITIES: No pedal edema, cyanosis, or clubbing.  NEUROLOGIC: Cranial nerves II through XII are intact. Muscle strength 5/5 in all extremities. Sensation intact. Gait not checked.  PSYCHIATRIC: The patient is alert and oriented x 3.  SKIN: No obvious rash, lesion, or ulcer.   DATA REVIEW:   CBC  Recent Labs Lab 12/19/15 0401  WBC 6.7  HGB 8.3*  HCT 24.8*  PLT 355    Chemistries   Recent Labs Lab 12/18/15 1254 12/19/15 0401  NA 134* 134*  K 4.9 4.6  CL 104 102  CO2 23 28  GLUCOSE 69 64*  BUN 29* 29*  CREATININE 1.29* 1.29*  CALCIUM 9.8 9.1  AST 33  --   ALT 30  --   ALKPHOS 71  --   BILITOT 0.3  --     Cardiac Enzymes  Recent Labs Lab 12/19/15 0401  TROPONINI <0.03    Microbiology Results  Results for orders placed or performed during the hospital encounter of 12/18/15  Urine culture     Status: Abnormal (Preliminary result)   Collection Time: 12/18/15 12:55 PM  Result Value Ref Range Status   Specimen Description URINE, RANDOM  Final   Special Requests NONE  Final   Culture >=100,000 COLONIES/mL ESCHERICHIA COLI (A)  Final   Report Status PENDING  Incomplete    RADIOLOGY:  US Renal  12/18/2015  CLINICAL DATA:  Renal insufficiency EXAM: RENAL / URINARY TRACT ULTRASOUND COMPLETE COMPARISON:  None. FINDINGS: Right Kidney: Length: 10 cm. Echogenicity within normal limits. No mass or hydronephrosis visualized. Left Kidney: Length: 9.8 cm. Echogenicity within normal limits. No mass or hydronephrosis visualized. Bladder: Appears normal for degree of bladder distention. IMPRESSION: No acute abnormality noted. Electronically Signed   By: Alcide Clever M.D.   On: 12/18/2015 15:50    EKG:   Orders placed or performed during the hospital encounter of  12/18/15  . ED EKG  . ED EKG      Management plans discussed with the patient, family and they are in agreement.  CODE STATUS:     Code Status Orders        Start     Ordered   12/18/15 1556  Full code   Continuous     12/18/15 1555    Code Status History    Date Active Date Inactive Code Status Order ID Comments User Context   This patient has a current code status but no historical code status.    Advance Directive Documentation        Most Recent Value   Type of Advance Directive  Healthcare Power of Attorney   Pre-existing out of facility  DNR order (yellow form or pink MOST form)     "MOST" Form in Place?        TOTAL TIME TAKING CARE OF THIS PATIENT: 35 minutes.    Altamese Dilling M.D on 12/20/2015 at 1:17 PM  Between 7am to 6pm - Pager - [phone]  After 6pm go to [email or web address] - password [email or web address]  [facility]  Office  [phone]  CC: Primary care physician; Evelene Croon, MD   Note: This dictation was prepared with Dragon dictation along with smaller phrase technology. Any transcriptional errors that result from this process are unintentional.

## 2015-12-21 LAB — URINE CULTURE: Culture: >=100000 — AB

## 2016-01-25 ENCOUNTER — Ambulatory Visit (INDEPENDENT_AMBULATORY_CARE_PROVIDER_SITE_OTHER): Payer: Medicare Other | Admitting: Sports Medicine

## 2016-01-25 ENCOUNTER — Encounter: Payer: Self-pay | Admitting: Sports Medicine

## 2016-01-25 DIAGNOSIS — M79672 Pain in left foot: Secondary | ICD-10-CM

## 2016-01-25 DIAGNOSIS — B351 Tinea unguium: Secondary | ICD-10-CM | POA: Diagnosis not present

## 2016-01-25 DIAGNOSIS — L603 Nail dystrophy: Secondary | ICD-10-CM

## 2016-01-25 DIAGNOSIS — E119 Type 2 diabetes mellitus without complications: Secondary | ICD-10-CM

## 2016-01-25 DIAGNOSIS — M79671 Pain in right foot: Secondary | ICD-10-CM | POA: Diagnosis not present

## 2016-01-25 NOTE — Progress Notes (Signed)
Patient ID: Emma Fletcher, female   DOB: [birth year], 60 y.o.   MRN: [id number]  Subjective: Emma Fletcher is a 60 y.o. female patient with history of type 2 diabetes who presents to office today complaining of long, painful nails  while ambulating in shoes; unable to trim. Patient states that the glucose reading this morning was 90 mg/dl. Reports swelling at ankles.  Patient denies any new cramping, numbness, burning or tingling in the legs.   Patient Active Problem List   Diagnosis Date Noted  . Hypoglycemia associated with diabetes (HCC) 12/18/2015  . Hyponatremia 12/18/2015  . Acute renal insufficiency 12/18/2015  . Anemia 12/18/2015  . Acute pyelonephritis 12/18/2015  . Hypoglycemia 12/18/2015  . HYPERTENSION, UNSPECIFIED 11/10/2008  . CARDIOMYOPATHY, SECONDARY 11/10/2008  . EDEMA 11/10/2008   Current Outpatient Prescriptions on File Prior to Visit  Medication Sig Dispense Refill  . budesonide-formoterol (SYMBICORT) 160-4.5 MCG/ACT inhaler Inhale 2 puffs into the lungs 2 (two) times daily.    . Calcium Carb-Cholecalciferol (CALCIUM 600+D) 600-800 MG-UNIT TABS Take 1 tablet by mouth daily.    . cefUROXime (CEFTIN) 250 MG tablet Take 1 tablet (250 mg total) by mouth 2 (two) times daily with a meal. 8 tablet 0  . Choline Fenofibrate (FENOFIBRIC ACID) 135 MG CPDR Take 135 mg by mouth daily.     . clotrimazole (LOTRIMIN) 1 % cream Apply 1 application topically 2 (two) times daily. 30 g 0  . estrogen, conjugated,-medroxyprogesterone (PREMPRO) 0.625-2.5 MG per tablet Take 1 tablet by mouth daily.      . furosemide (LASIX) 20 MG tablet Take 20 mg by mouth daily.      Marland Kitchen glimepiride (AMARYL) 2 MG tablet Take 2 mg by mouth daily after breakfast.     . Iron, Ferrous Sulfate, 142 (45 Fe) MG TBCR Take 90 mg of iron by mouth daily.    . lansoprazole (PREVACID) 30 MG capsule Take 30 mg by mouth daily.     Marland Kitchen levothyroxine (SYNTHROID, LEVOTHROID) 100 MCG tablet Take 100 mcg by mouth daily.       Marland Kitchen lisinopril (PRINIVIL,ZESTRIL) 20 MG tablet Take 20 mg by mouth daily.      . Multiple Vitamins-Minerals (ONE-A-DAY WOMENS 50 PLUS PO) Take 1 tablet by mouth daily.    . pioglitazone (ACTOS) 15 MG tablet Take 15 mg by mouth daily.     . potassium chloride (KLOR-CON) 10 MEQ CR tablet Take 10 mEq by mouth daily.      Marland Kitchen SPIRIVA HANDIHALER 18 MCG inhalation capsule Place 18 mcg into inhaler and inhale daily.     . vitamin C (ASCORBIC ACID) 500 MG tablet Take 500 mg by mouth daily.     No current facility-administered medications on file prior to visit.    No Known Allergies  Objective: General: Patient is awake, alert, and oriented x 3 and in no acute distress.  Integument: Skin is warm, dry and supple bilateral. Nails are tender, long,  and  dystrophic , 1-5 bilateral. No signs of infection. Remaining integument unremarkable.  Vasculature:  Dorsalis Pedis pulse 2/4 bilateral. Posterior Tibial pulse 1 /4 bilateral. Capillary fill time <3 sec 1-5 bilateral. Scant hair growth to the level of the digits.Temperature gradient within normal limits. + varicosities present bilateral with trace edema.   Neurology: The patient has intact sensation measured with a 5.07/10g Semmes Weinstein Monofilament at all pedal sites bilateral . Vibratory sensation diminished bilateral with tuning fork. No Babinski sign present bilateral.   Musculoskeletal: No Pain  to palpation bilateral, No gross pedal deformities noted bilateral. Muscular strength 5/5 in all lower extremity muscular groups bilateral without pain or limitation. No tenderness with calf compression bilateral.  Assessment and Plan: Problem List Items Addressed This Visit    None    Visit Diagnoses    Diabetes mellitus without complication (HCC)    -  Primary   Foot pain, bilateral       Dystrophic nail         -Examined patient. -Discussed and educated patient on diabetic foot care, especially with  regards to the vascular, neurological and  musculoskeletal systems.  -Stressed the importance of good glycemic control and the detriment of not  controlling glucose levels in relation to the foot. -Mechanically trimmed all nails 1-5 bilateral using sterile nail nipper and filed with dremel without incident  -Gave surgi-tube compression socks to use for swelling and ankle support -Answered all patient questions -Patient to return as needed or in 3 months for at risk foot care -Patient advised to call the office if any problems or questions arise in the meantime.  Asencion Islam, DPM

## 2016-04-25 ENCOUNTER — Ambulatory Visit (INDEPENDENT_AMBULATORY_CARE_PROVIDER_SITE_OTHER): Payer: Medicare Other | Admitting: Podiatry

## 2016-04-25 ENCOUNTER — Encounter: Payer: Self-pay | Admitting: Podiatry

## 2016-04-25 VITALS — BP 128/73 | HR 77

## 2016-04-25 DIAGNOSIS — M7751 Other enthesopathy of right foot: Secondary | ICD-10-CM

## 2016-04-25 DIAGNOSIS — M659 Synovitis and tenosynovitis, unspecified: Secondary | ICD-10-CM | POA: Diagnosis not present

## 2016-04-25 DIAGNOSIS — Q828 Other specified congenital malformations of skin: Secondary | ICD-10-CM

## 2016-04-25 DIAGNOSIS — M25571 Pain in right ankle and joints of right foot: Secondary | ICD-10-CM

## 2016-04-25 MED ORDER — BETAMETHASONE SOD PHOS & ACET 6 (3-3) MG/ML IJ SUSP: 3.0000 mg | Freq: Once | INTRAMUSCULAR | Status: DC

## 2016-04-25 NOTE — Progress Notes (Signed)
Subjective:  Patient presents today for pain and tenderness to the right ankle. Patient relates significant pain and tenderness when walking.  Patient also presents with a painful callus lesion to the fifth metatarsal tubercle of the right foot. Patient presents for further treatment and evaluation.    Objective / Physical Exam:  General:  The patient is alert and oriented x3 in no acute distress. Dermatology:  Hyperkeratotic skin lesion noted to the weightbearing surface of the tubercle of the fifth metatarsal of the right foot. Skin is warm, dry and supple bilateral lower extremities. Negative for open lesions or macerations. Vascular:  Palpable pedal pulses bilaterally. No edema or erythema noted. Capillary refill within normal limits. Neurological:  Epicritic and protective threshold grossly intact bilaterally.  Musculoskeletal Exam:  Pain on palpation to the anterior lateral medial aspects of the patient's left ankle. Mild edema noted.  Range of motion within normal limits to all pedal and ankle joints bilateral. Muscle strength 5/5 in all groups bilateral.   Radiographic Exam:  Normal osseous mineralization. Joint spaces preserved. No fracture/dislocation/boney destruction.    Assessment: #1 pain in right ankle #2 synovitis of right ankle #3 capsulitis of right ankle  Plan of Care:  #1 Patient was evaluated. #2 injection of 0.5 mL Celestone Soluspan injected in the patient's right ankle. #3 excisional debridement of the callus to the tubercle of the fifth metatarsal of the right foot was performed today using a chisel blade without incident. #4 salicylic acid and a light dressing was applied. #5 return to clinic when necessary  Dr. Felecia Shelling, DPM [facility]

## 2016-04-28 ENCOUNTER — Ambulatory Visit: Payer: Medicare Other | Admitting: Podiatry

## 2016-05-15 ENCOUNTER — Observation Stay
Admission: EM | Admit: 2016-05-15 | Discharge: 2016-05-16 | Disposition: A | Discharge status: Home / Self Care | Payer: Medicare Other | Attending: Internal Medicine | Admitting: Internal Medicine

## 2016-05-15 ENCOUNTER — Encounter: Payer: Self-pay | Admitting: Emergency Medicine

## 2016-05-15 DIAGNOSIS — M659 Synovitis and tenosynovitis, unspecified: Secondary | ICD-10-CM

## 2016-05-15 DIAGNOSIS — E871 Hypo-osmolality and hyponatremia: Secondary | ICD-10-CM | POA: Diagnosis present

## 2016-05-15 DIAGNOSIS — N179 Acute kidney failure, unspecified: Secondary | ICD-10-CM | POA: Diagnosis not present

## 2016-05-15 DIAGNOSIS — Z823 Family history of stroke: Secondary | ICD-10-CM | POA: Diagnosis not present

## 2016-05-15 DIAGNOSIS — I129 Hypertensive chronic kidney disease with stage 1 through stage 4 chronic kidney disease, or unspecified chronic kidney disease: Secondary | ICD-10-CM | POA: Insufficient documentation

## 2016-05-15 DIAGNOSIS — M25571 Pain in right ankle and joints of right foot: Secondary | ICD-10-CM

## 2016-05-15 DIAGNOSIS — E860 Dehydration: Secondary | ICD-10-CM | POA: Insufficient documentation

## 2016-05-15 DIAGNOSIS — F89 Unspecified disorder of psychological development: Secondary | ICD-10-CM | POA: Insufficient documentation

## 2016-05-15 DIAGNOSIS — N390 Urinary tract infection, site not specified: Secondary | ICD-10-CM

## 2016-05-15 DIAGNOSIS — D638 Anemia in other chronic diseases classified elsewhere: Secondary | ICD-10-CM | POA: Diagnosis present

## 2016-05-15 DIAGNOSIS — D649 Anemia, unspecified: Secondary | ICD-10-CM | POA: Insufficient documentation

## 2016-05-15 DIAGNOSIS — E7800 Pure hypercholesterolemia, unspecified: Secondary | ICD-10-CM | POA: Insufficient documentation

## 2016-05-15 DIAGNOSIS — R8290 Unspecified abnormal findings in urine: Secondary | ICD-10-CM | POA: Insufficient documentation

## 2016-05-15 DIAGNOSIS — E11649 Type 2 diabetes mellitus with hypoglycemia without coma: Secondary | ICD-10-CM | POA: Diagnosis not present

## 2016-05-15 DIAGNOSIS — E1122 Type 2 diabetes mellitus with diabetic chronic kidney disease: Secondary | ICD-10-CM | POA: Insufficient documentation

## 2016-05-15 DIAGNOSIS — N182 Chronic kidney disease, stage 2 (mild): Secondary | ICD-10-CM

## 2016-05-15 DIAGNOSIS — M7751 Other enthesopathy of right foot: Secondary | ICD-10-CM

## 2016-05-15 DIAGNOSIS — I429 Cardiomyopathy, unspecified: Secondary | ICD-10-CM | POA: Diagnosis not present

## 2016-05-15 DIAGNOSIS — M6281 Muscle weakness (generalized): Secondary | ICD-10-CM

## 2016-05-15 DIAGNOSIS — E162 Hypoglycemia, unspecified: Secondary | ICD-10-CM | POA: Diagnosis present

## 2016-05-15 DIAGNOSIS — Z79899 Other long term (current) drug therapy: Secondary | ICD-10-CM | POA: Insufficient documentation

## 2016-05-15 LAB — BASIC METABOLIC PANEL
Anion gap: 6 (ref 5–15)
Anion gap: 7 (ref 5–15)
BUN: 23 mg/dL — AB (ref 6–20)
BUN: 28 mg/dL — AB (ref 6–20)
CALCIUM: 10 mg/dL (ref 8.9–10.3)
CO2: 25 mmol/L (ref 22–32)
CO2: 29 mmol/L (ref 22–32)
CREATININE: 1.43 mg/dL — AB (ref 0.44–1.00)
Calcium: 9.5 mg/dL (ref 8.9–10.3)
Chloride: 103 mmol/L (ref 101–111)
Chloride: 98 mmol/L — ABNORMAL LOW (ref 101–111)
Creatinine, Ser: 1.06 mg/dL — ABNORMAL HIGH (ref 0.44–1.00)
GFR calc Af Amer: >60 mL/min (ref >60)
GFR, EST AFRICAN AMERICAN: 45 mL/min — AB (ref >60)
GFR, EST NON AFRICAN AMERICAN: 39 mL/min — AB (ref >60)
GFR, EST NON AFRICAN AMERICAN: 56 mL/min — AB (ref >60)
GLUCOSE: 136 mg/dL — AB (ref 65–99)
Glucose, Bld: 42 mg/dL — CL (ref 65–99)
POTASSIUM: 4.5 mmol/L (ref 3.5–5.1)
Potassium: 4.3 mmol/L (ref 3.5–5.1)
SODIUM: 134 mmol/L — AB (ref 135–145)
Sodium: 134 mmol/L — ABNORMAL LOW (ref 135–145)

## 2016-05-15 LAB — CBC WITH DIFFERENTIAL/PLATELET
BASOS PCT: 0 %
Basophils Absolute: 0 10*3/uL (ref 0–0.1)
EOS ABS: 0 10*3/uL (ref 0–0.7)
EOS PCT: 0 %
HCT: 33 % — ABNORMAL LOW (ref 35.0–47.0)
HEMOGLOBIN: 11 g/dL — AB (ref 12.0–16.0)
LYMPHS ABS: 0.8 10*3/uL — AB (ref 1.0–3.6)
Lymphocytes Relative: 10 %
MCH: 29.3 pg (ref 26.0–34.0)
MCHC: 33.4 g/dL (ref 32.0–36.0)
MCV: 87.8 fL (ref 80.0–100.0)
MONOS PCT: 6 %
Monocytes Absolute: 0.5 10*3/uL (ref 0.2–0.9)
Neutro Abs: 6.6 10*3/uL — ABNORMAL HIGH (ref 1.4–6.5)
Neutrophils Relative %: 84 %
PLATELETS: 330 10*3/uL (ref 150–440)
RBC: 3.75 MIL/uL — ABNORMAL LOW (ref 3.80–5.20)
RDW: 15.2 % — AB (ref 11.5–14.5)
WBC: 8 10*3/uL (ref 3.6–11.0)

## 2016-05-15 LAB — URINALYSIS COMPLETE WITH MICROSCOPIC (ARMC ONLY)
BILIRUBIN URINE: NEGATIVE
GLUCOSE, UA: NEGATIVE mg/dL
HGB URINE DIPSTICK: NEGATIVE
Ketones, ur: NEGATIVE mg/dL
NITRITE: NEGATIVE
Protein, ur: NEGATIVE mg/dL
SPECIFIC GRAVITY, URINE: 1.01 (ref 1.005–1.030)
pH: 7 (ref 5.0–8.0)

## 2016-05-15 LAB — GLUCOSE, CAPILLARY
GLUCOSE-CAPILLARY: 67 mg/dL (ref 65–99)
GLUCOSE-CAPILLARY: 119 mg/dL — AB (ref 65–99)
GLUCOSE-CAPILLARY: 94 mg/dL (ref 65–99)
GLUCOSE-CAPILLARY: 158 mg/dL — AB (ref 65–99)
GLUCOSE-CAPILLARY: 140 mg/dL — AB (ref 65–99)
Glucose-Capillary: 109 mg/dL — ABNORMAL HIGH (ref 65–99)
Glucose-Capillary: 112 mg/dL — ABNORMAL HIGH (ref 65–99)
Glucose-Capillary: 116 mg/dL — ABNORMAL HIGH (ref 65–99)
Glucose-Capillary: 138 mg/dL — ABNORMAL HIGH (ref 65–99)
Glucose-Capillary: 140 mg/dL — ABNORMAL HIGH (ref 65–99)
Glucose-Capillary: 145 mg/dL — ABNORMAL HIGH (ref 65–99)
Glucose-Capillary: 160 mg/dL — ABNORMAL HIGH (ref 65–99)
Glucose-Capillary: 166 mg/dL — ABNORMAL HIGH (ref 65–99)
Glucose-Capillary: 47 mg/dL — ABNORMAL LOW (ref 65–99)
Glucose-Capillary: 47 mg/dL — ABNORMAL LOW (ref 65–99)

## 2016-05-15 MED ORDER — PANTOPRAZOLE SODIUM 20 MG PO TBEC: 20.0000 mg | DELAYED_RELEASE_TABLET | Freq: Every day | ORAL | Status: DC

## 2016-05-15 MED ORDER — FENOFIBRATE 145 MG PO TABS
145.0000 mg | ORAL_TABLET | Freq: Every day | ORAL | Status: DC
  Administered 2016-05-15 – 2016-05-16 (×2): 145 mg via ORAL
  Filled 2016-05-15 (×2): qty 1

## 2016-05-15 MED ORDER — ACETAMINOPHEN 325 MG PO TABS
650.0000 mg | ORAL_TABLET | Freq: Four times a day (QID) | ORAL | Status: DC | PRN
  Administered 2016-05-16: 650 mg via ORAL
  Filled 2016-05-15 (×2): qty 2

## 2016-05-15 MED ORDER — CALCIUM CARB-CHOLECALCIFEROL 600-800 MG-UNIT PO TABS: 1.0000 | ORAL_TABLET | Freq: Every day | ORAL | Status: DC

## 2016-05-15 MED ORDER — ONDANSETRON HCL 4 MG/2ML IJ SOLN: 4.0000 mg | Freq: Four times a day (QID) | INTRAMUSCULAR | Status: DC | PRN

## 2016-05-15 MED ORDER — LISINOPRIL 20 MG PO TABS
20.0000 mg | ORAL_TABLET | Freq: Every day | ORAL | Status: DC
  Administered 2016-05-15 – 2016-05-16 (×2): 20 mg via ORAL
  Filled 2016-05-15 (×2): qty 1

## 2016-05-15 MED ORDER — ONDANSETRON HCL 4 MG PO TABS: 4.0000 mg | ORAL_TABLET | Freq: Four times a day (QID) | ORAL | Status: DC | PRN

## 2016-05-15 MED ORDER — SENNOSIDES-DOCUSATE SODIUM 8.6-50 MG PO TABS: 1.0000 | ORAL_TABLET | Freq: Every evening | ORAL | Status: DC | PRN

## 2016-05-15 MED ORDER — LEVOTHYROXINE SODIUM 100 MCG PO TABS
100.0000 ug | ORAL_TABLET | Freq: Every day | ORAL | Status: DC
  Administered 2016-05-15 – 2016-05-16 (×2): 100 ug via ORAL
  Filled 2016-05-15 (×2): qty 1

## 2016-05-15 MED ORDER — ENOXAPARIN SODIUM 40 MG/0.4ML ~~LOC~~ SOLN
40.0000 mg | SUBCUTANEOUS | Status: DC
  Administered 2016-05-15: 40 mg via SUBCUTANEOUS
  Filled 2016-05-15: qty 0.4

## 2016-05-15 MED ORDER — ALUM & MAG HYDROXIDE-SIMETH 200-200-20 MG/5ML PO SUSP
15.0000 mL | ORAL | Status: DC | PRN
  Filled 2016-05-15: qty 30

## 2016-05-15 MED ORDER — PANTOPRAZOLE SODIUM 40 MG PO TBEC
40.0000 mg | DELAYED_RELEASE_TABLET | Freq: Every day | ORAL | Status: DC
  Administered 2016-05-15 – 2016-05-16 (×2): 40 mg via ORAL
  Filled 2016-05-15 (×2): qty 1

## 2016-05-15 MED ORDER — DEXTROSE 5 % IV SOLN: 1.0000 g | Freq: Once | INTRAVENOUS | Status: DC

## 2016-05-15 MED ORDER — ALENDRONATE SODIUM 70 MG PO TABS: 70.0000 mg | ORAL_TABLET | ORAL | Status: DC

## 2016-05-15 MED ORDER — ACETAMINOPHEN 650 MG RE SUPP: 650.0000 mg | Freq: Four times a day (QID) | RECTAL | Status: DC | PRN

## 2016-05-15 MED ORDER — CALCIUM CARBONATE-VITAMIN D 500-200 MG-UNIT PO TABS
1.0000 | ORAL_TABLET | Freq: Every day | ORAL | Status: DC
  Administered 2016-05-15 – 2016-05-16 (×2): 1 via ORAL
  Filled 2016-05-15 (×2): qty 1

## 2016-05-15 MED ORDER — VITAMIN C 500 MG PO TABS
500.0000 mg | ORAL_TABLET | Freq: Every day | ORAL | Status: DC
  Administered 2016-05-15 – 2016-05-16 (×2): 500 mg via ORAL
  Filled 2016-05-15 (×2): qty 1

## 2016-05-15 MED ORDER — DEXTROSE-NACL 5-0.9 % IV SOLN
INTRAVENOUS | Status: DC
  Administered 2016-05-15: 100 mL/h via INTRAVENOUS

## 2016-05-15 MED ORDER — DEXTROSE 50 % IV SOLN
50.0000 mL | Freq: Once | INTRAVENOUS | Status: AC
  Administered 2016-05-15: 50 mL via INTRAVENOUS

## 2016-05-15 MED ORDER — ALBUTEROL SULFATE (2.5 MG/3ML) 0.083% IN NEBU: 3.0000 mL | INHALATION_SOLUTION | RESPIRATORY_TRACT | Status: DC | PRN

## 2016-05-15 MED ORDER — TIOTROPIUM BROMIDE MONOHYDRATE 18 MCG IN CAPS
18.0000 ug | ORAL_CAPSULE | Freq: Every day | RESPIRATORY_TRACT | Status: DC
Start: 2016-05-15 — End: 2016-05-16
  Administered 2016-05-15 – 2016-05-16 (×2): 18 ug via RESPIRATORY_TRACT
  Filled 2016-05-15: qty 5

## 2016-05-15 MED ORDER — CEFTRIAXONE SODIUM-DEXTROSE 1-3.74 GM-% IV SOLR
1.0000 g | Freq: Once | INTRAVENOUS | Status: AC
  Administered 2016-05-15: 1 g via INTRAVENOUS
  Filled 2016-05-15: qty 50

## 2016-05-15 MED ORDER — LINAGLIPTIN 5 MG PO TABS
2.5000 mg | ORAL_TABLET | Freq: Every day | ORAL | Status: DC
  Administered 2016-05-16: 2.5 mg via ORAL
  Filled 2016-05-15: qty 1

## 2016-05-15 MED ORDER — FENOFIBRIC ACID 135 MG PO CPDR: 135.0000 mg | DELAYED_RELEASE_CAPSULE | Freq: Every day | ORAL | Status: DC

## 2016-05-15 MED ORDER — DEXTROSE 50 % IV SOLN
1.0000 | Freq: Once | INTRAVENOUS | Status: AC
  Administered 2016-05-15: 50 mL via INTRAVENOUS
  Filled 2016-05-15: qty 50

## 2016-05-15 MED ORDER — GLIMEPIRIDE 2 MG PO TABS: 1.0000 mg | ORAL_TABLET | Freq: Every day | ORAL | Status: DC

## 2016-05-15 MED ORDER — MOMETASONE FURO-FORMOTEROL FUM 200-5 MCG/ACT IN AERO
2.0000 | INHALATION_SPRAY | Freq: Two times a day (BID) | RESPIRATORY_TRACT | Status: DC
  Administered 2016-05-15 – 2016-05-16 (×3): 2 via RESPIRATORY_TRACT
  Filled 2016-05-15: qty 8.8

## 2016-05-15 NOTE — Care Management Obs Status (Signed)
MEDICARE OBSERVATION STATUS NOTIFICATION   Patient Details  Name: TANVEE LAURO MRN: [id number] Date of Birth: 1956-01-26   Medicare Observation Status Notification Given:  Yes    Marily Memos, RN 05/15/2016, 2:49 PM

## 2016-05-15 NOTE — Progress Notes (Signed)
Inpatient Diabetes Program Recommendations  AACE/ADA: New Consensus Statement on Inpatient Glycemic Control (2015)  Target Ranges:  Prepandial:   less than 140 mg/dL      Peak postprandial:   less than 180 mg/dL (1-2 hours)      Critically ill patients:  140 - 180 mg/dL   Results for Emma Fletcher, Emma Fletcher (MRN [id number]) as of 05/15/2016 10:19  Ref. Range 05/15/2016 00:25 05/15/2016 00:28 05/15/2016 01:55 05/15/2016 02:47 05/15/2016 04:49 05/15/2016 06:56 05/15/2016 07:40 05/15/2016 09:18  Glucose-Capillary Latest Ref Range: 65 - 99 mg/dL 47 (L) 47 (L) 67 297 (H) 109 (H) 112 (H) 119 (H) 94    Admit with: Hypoglycemia  History: DM2  Home DM Meds: Amaryl 2 mg daily       Actos 15 mg daily       Tradjenta 5 mg daily  Current Insulin Orders: None        MD- Note patient admitted with Hypoglycemia.  CBGs have stabilized since admission.  Patient is taking three oral DM medications at home for glucose control.  Suspect Amaryl (glimepiride) is the likely cause of the Hypoglycemia.  Tradjenta and Actos have very low risk for Hypoglycemia.  Please consider ordering current Hemoglobin A1c level      --Will follow patient during hospitalization--  Ambrose Finland RN, MSN, CDE Diabetes Coordinator Inpatient Glycemic Control Team Team Pager: [phone] (8a-5p)

## 2016-05-15 NOTE — ED Provider Notes (Signed)
[facility] Emergency Department Provider Note  ____________________________________________  Time seen: Approximately 12:34 AM  I have reviewed the triage vital signs and the nursing notes.   HISTORY  Chief Complaint Hypoglycemia   HPI Emma Fletcher is a 60 y.o. female with a history of anemia, diabetes with episodes of hypoglycemia, developmental delay, and COPD who presents for evaluation of hypoglycemia. Patient is on glimepiride and pupil it is own. She takes both of these medications once a day in the morning. She last took them this morning at 8 AM. At 7:30 PM she reports that she started feeling "funny". Which is how she normally feels when she is hypoglycemic. She took her BG at home which was 38. She reports that she had a candy bar, oranges, peanut butter sandwich, and cake icing. Last BG at home was 54. Patient had one episode of nonbloody nonbilious emesis upon arrival to the emergency room. At this time she is asymptomatic. Patient denies any recent illnesses, changes in her medications or new medications. She denies chest pain, shortness of breath, cough, chills, nausea, diarrhea, dysuria, fever. She does endorse that her sugars usually drop when she has a urinary tract infection.  Past Medical History:  Diagnosis Date  . Anemia   . Cardiomyopathy secondary   . Developmental delay   . Diabetes mellitus without complication (HCC)   . Edema   . Hypercholesterolemia   . Unspecified essential hypertension     Patient Active Problem List   Diagnosis Date Noted  . Hypoglycemia associated with diabetes (HCC) 12/18/2015  . Hyponatremia 12/18/2015  . Acute renal insufficiency 12/18/2015  . Anemia 12/18/2015  . Acute pyelonephritis 12/18/2015  . Hypoglycemia 12/18/2015  . HYPERTENSION, UNSPECIFIED 11/10/2008  . CARDIOMYOPATHY, SECONDARY 11/10/2008  . EDEMA 11/10/2008    History reviewed. No pertinent surgical history.  Prior to Admission  medications   Medication Sig Start Date End Date Taking? Authorizing Provider  budesonide-formoterol (SYMBICORT) 160-4.5 MCG/ACT inhaler Inhale 2 puffs into the lungs 2 (two) times daily.   Yes Historical Provider, MD  Calcium Carb-Cholecalciferol (CALCIUM 600+D) 600-800 MG-UNIT TABS Take 1 tablet by mouth daily.   Yes Historical Provider, MD  Choline Fenofibrate (FENOFIBRIC ACID) 135 MG CPDR Take 135 mg by mouth daily.  07/19/15  Yes Historical Provider, MD  furosemide (LASIX) 20 MG tablet Take 20 mg by mouth daily.     Yes Historical Provider, MD  glimepiride (AMARYL) 2 MG tablet Take 2 mg by mouth daily after breakfast.  06/18/15  Yes Historical Provider, MD  Iron, Ferrous Sulfate, 142 (45 Fe) MG TBCR Take 90 mg of iron by mouth daily.   Yes Historical Provider, MD  lansoprazole (PREVACID) 30 MG capsule Take 30 mg by mouth daily.  06/01/15  Yes Historical Provider, MD  levothyroxine (SYNTHROID, LEVOTHROID) 100 MCG tablet Take 100 mcg by mouth daily.     Yes Historical Provider, MD  lisinopril (PRINIVIL,ZESTRIL) 20 MG tablet Take 20 mg by mouth daily.     Yes Historical Provider, MD  Multiple Vitamins-Minerals (ONE-A-DAY WOMENS 50 PLUS PO) Take 1 tablet by mouth daily.   Yes Historical Provider, MD  pioglitazone (ACTOS) 15 MG tablet Take 15 mg by mouth daily.  06/18/15  Yes Historical Provider, MD  potassium chloride (KLOR-CON) 10 MEQ CR tablet Take 10 mEq by mouth daily.     Yes Historical Provider, MD  SPIRIVA HANDIHALER 18 MCG inhalation capsule Place 18 mcg into inhaler and inhale daily.  07/19/15  Yes  Historical Provider, MD  vitamin C (ASCORBIC ACID) 500 MG tablet Take 500 mg by mouth daily.   Yes Historical Provider, MD    Allergies Patient has no known allergies.  Family History  Problem Relation Age of Onset  . Coronary artery disease Other   . Stroke Other     CVA    Social History Social History  Substance Use Topics  . Smoking status: Never Smoker  . Smokeless tobacco:  Never Used  . Alcohol use No    Review of Systems  Constitutional: Negative for fever. Eyes: Negative for visual changes. ENT: Negative for sore throat. Neck: No neck pain  Cardiovascular: Negative for chest pain. Respiratory: Negative for shortness of breath. Gastrointestinal: Negative for abdominal pain, diarrhea. + vomiting Genitourinary: Negative for dysuria. Musculoskeletal: Negative for back pain. Skin: Negative for rash. Neurological: Negative for headaches, weakness or numbness. Psych: No SI or HI  ____________________________________________   PHYSICAL EXAM:  VITAL SIGNS: ED Triage Vitals [05/15/16 0030]  Enc Vitals Group     BP (!) 179/92     Pulse Rate 77     Resp (!) 26     Temp 97.6 F (36.4 C)     Temp Source Oral     SpO2 100 %     Weight      Height      Head Circumference      Peak Flow      Pain Score      Pain Loc      Pain Edu?      Excl. in GC?     Constitutional: Alert and oriented. Well appearing and in no apparent distress. HEENT:      Head: Normocephalic and atraumatic.         Eyes: Conjunctivae are normal. Sclera is non-icteric. EOMI. PERRL      Mouth/Throat: Mucous membranes are moist.       Neck: Supple with no signs of meningismus. Cardiovascular: Regular rate and rhythm. No murmurs, gallops, or rubs. 2+ symmetrical distal pulses are present in all extremities. No JVD. Respiratory: Normal respiratory effort. Lungs are clear to auscultation bilaterally. No wheezes, crackles, or rhonchi.  Gastrointestinal: Soft, non tender, and non distended with positive bowel sounds. No rebound or guarding. Genitourinary: No CVA tenderness. Musculoskeletal: Nontender with normal range of motion in all extremities. No edema, cyanosis, or erythema of extremities. Neurologic: Normal speech and language. Face is symmetric. Moving all extremities. No gross focal neurologic deficits are appreciated. Skin: Skin is warm, dry and intact. No rash  noted. Psychiatric: Mood and affect are normal. Speech and behavior are normal.  ____________________________________________   LABS (all labs ordered are listed, but only abnormal results are displayed)  Labs Reviewed  GLUCOSE, CAPILLARY - Abnormal; Notable for the following:       Result Value   Glucose-Capillary 47 (*)    All other components within normal limits  GLUCOSE, CAPILLARY - Abnormal; Notable for the following:    Glucose-Capillary 47 (*)    All other components within normal limits  CBC WITH DIFFERENTIAL/PLATELET - Abnormal; Notable for the following:    RBC 3.75 (*)    Hemoglobin 11.0 (*)    HCT 33.0 (*)    RDW 15.2 (*)    Neutro Abs 6.6 (*)    Lymphs Abs 0.8 (*)    All other components within normal limits  BASIC METABOLIC PANEL - Abnormal; Notable for the following:    Sodium 134 (*)  Chloride 98 (*)    Glucose, Bld 42 (*)    BUN 28 (*)    Creatinine, Ser 1.43 (*)    GFR calc non Af Amer 39 (*)    GFR calc Af Amer 45 (*)    All other components within normal limits  URINALYSIS COMPLETEWITH MICROSCOPIC ([facility] ONLY) - Abnormal; Notable for the following:    Color, Urine STRAW (*)    APPearance CLEAR (*)    Leukocytes, UA 1+ (*)    Bacteria, UA RARE (*)    Squamous Epithelial / LPF 0-5 (*)    All other components within normal limits  URINE CULTURE  GLUCOSE, CAPILLARY   ____________________________________________  EKG  ED ECG REPORT I, Nita Sickle, the attending physician, personally viewed and interpreted this ECG. Normal sinus rhythm, rate of 65, normal intervals, normal axis, no ST elevations or depressions. ____________________________________________  RADIOLOGY  none____________________________________________   PROCEDURES  Procedure(s) performed: None Procedures Critical Care performed:  None ____________________________________________   INITIAL IMPRESSION / ASSESSMENT AND PLAN / ED COURSE  60 y.o. female with a history  of anemia, diabetes with episodes of hypoglycemia, developmental delay, and COPD who presents for evaluation of multiple episodes of hypoglycemia today. Patient has had no new medications or change in recent dosages. Patient is not on insulin. Her last dose of glimepiride and pioglitazone was yesterday morning. We'll check basic labs, urinalysis, EKG. We'll give D50.  Clinical Course    UA with 1+ leuks and rare bacteria, patient was given one dose of ceftriaxone. Culture pending. BG dorpped again to 65 after 1 amp of D50. 2nd amp ordered and patient will be admitted to hospitalist.   Pertinent labs & imaging results that were available during my care of the patient were reviewed by me and considered in my medical decision making (see chart for details).    ____________________________________________   FINAL CLINICAL IMPRESSION(S) / ED DIAGNOSES  Final diagnoses:  Hypoglycemia due to type 2 diabetes mellitus (HCC)  Urinary tract infection without hematuria, site unspecified      NEW MEDICATIONS STARTED DURING THIS VISIT:  New Prescriptions   No medications on file     Note:  This document was prepared using Dragon voice recognition software and may include unintentional dictation errors.    Nita Sickle, MD 05/15/16 [phone]

## 2016-05-15 NOTE — H&P (Signed)
[facility] [place] [facility]   PATIENT NAME: Emma BernardBarbara Fletcher    MR#:  [id number]  DATE OF BIRTH:  Dec 02, 1955  DATE OF ADMISSION:  05/15/2016  PRIMARY CARE PHYSICIAN: Evelene CroonNIEMEYER, MEINDERT, MD   REQUESTING/REFERRING PHYSICIAN:   CHIEF COMPLAINT:   Chief Complaint  Patient presents with  . Hypoglycemia    HISTORY OF PRESENT ILLNESS: Emma BernardBarbara Fletcher  is a 60 y.o. female with a known history of Developmental retardation, cardiomyopathy, diabetes mellitus type 2, hyperlipidemia, hypertension presented to the emergency room because of transient disorientation last night. Around 10 PM patient had a lot of sweating and felt cold and clammy, was also disoriented according to the family member. Blood sugars were checked at home and they were low and patient came to the emergency room. In the emergency room blood sugars were around 30 mg/dL. Patient takes oral Actos and glimepiride for diabetes mellitus. Patient took her medications yesterday. No history of any syncope. No history of any seizure. Patient completely awake and alert and oriented to time place and person emergency room. No complaints of any chest pain, shortness of breath and palpitations. No fever or chills or cough. Hospitalist service was consulted for further care of the patient.  PAST MEDICAL HISTORY:   Past Medical History:  Diagnosis Date  . Anemia   . Cardiomyopathy secondary   . Developmental delay   . Diabetes mellitus without complication (HCC)   . Edema   . Hypercholesterolemia   . Unspecified essential hypertension     PAST SURGICAL HISTORY: Past Surgical History:  Procedure Laterality Date  . none      SOCIAL HISTORY:  Social History  Substance Use Topics  . Smoking status: Never Smoker  . Smokeless tobacco: Never Used  . Alcohol use No    FAMILY HISTORY:  Family History  Problem Relation Age of Onset  . Coronary artery disease Other   . Stroke Other     CVA  . Diabetes  Mother   . Diabetes Father     DRUG ALLERGIES: No Known Allergies  REVIEW OF SYSTEMS:   CONSTITUTIONAL: No fever, has weakness.  EYES: No blurred or double vision.  EARS, NOSE, AND THROAT: No tinnitus or ear pain.  RESPIRATORY: No cough, shortness of breath, wheezing or hemoptysis.  CARDIOVASCULAR: No chest pain, orthopnea, edema.  GASTROINTESTINAL: No nausea, vomiting, diarrhea or abdominal pain.  GENITOURINARY: No dysuria, hematuria.  ENDOCRINE: No polyuria, nocturia,  HEMATOLOGY: No anemia, easy bruising or bleeding SKIN: No rash or lesion. MUSCULOSKELETAL: No joint pain or arthritis.   Felt cold and clammy NEUROLOGIC: No tingling, numbness, weakness.  PSYCHIATRY: No anxiety or depression.   MEDICATIONS AT HOME:  Prior to Admission medications   Medication Sig Start Date End Date Taking? Authorizing Provider  alendronate (FOSAMAX) 70 MG tablet Take 1 tablet by mouth once a week. 05/08/16  Yes Historical Provider, MD  budesonide-formoterol (SYMBICORT) 160-4.5 MCG/ACT inhaler Inhale 2 puffs into the lungs 2 (two) times daily.   Yes Historical Provider, MD  Calcium Carb-Cholecalciferol (CALCIUM 600+D) 600-800 MG-UNIT TABS Take 1 tablet by mouth daily.   Yes Historical Provider, MD  Choline Fenofibrate (FENOFIBRIC ACID) 135 MG CPDR Take 135 mg by mouth daily.  07/19/15  Yes Historical Provider, MD  furosemide (LASIX) 20 MG tablet Take 20 mg by mouth daily.     Yes Historical Provider, MD  glimepiride (AMARYL) 2 MG tablet Take 2 mg by mouth daily after breakfast.  06/18/15  Yes Historical  Provider, MD  Iron, Ferrous Sulfate, 142 (45 Fe) MG TBCR Take 90 mg of iron by mouth daily.   Yes Historical Provider, MD  lansoprazole (PREVACID) 30 MG capsule Take 30 mg by mouth daily.  06/01/15  Yes Historical Provider, MD  levothyroxine (SYNTHROID, LEVOTHROID) 100 MCG tablet Take 100 mcg by mouth daily.     Yes Historical Provider, MD  lisinopril (PRINIVIL,ZESTRIL) 20 MG tablet Take 20 mg by  mouth daily.     Yes Historical Provider, MD  Multiple Vitamins-Minerals (ONE-A-DAY WOMENS 50 PLUS PO) Take 1 tablet by mouth daily.   Yes Historical Provider, MD  pioglitazone (ACTOS) 15 MG tablet Take 15 mg by mouth daily.  06/18/15  Yes Historical Provider, MD  potassium chloride (KLOR-CON) 10 MEQ CR tablet Take 10 mEq by mouth daily.     Yes Historical Provider, MD  SPIRIVA HANDIHALER 18 MCG inhalation capsule Place 18 mcg into inhaler and inhale daily.  07/19/15  Yes Historical Provider, MD  TRADJENTA 5 MG TABS tablet Take 1 tablet by mouth daily. 05/08/16  Yes Historical Provider, MD  VENTOLIN HFA 108 (90 Base) MCG/ACT inhaler Take 2 puffs by mouth every 4 (four) hours as needed. 05/04/16  Yes Historical Provider, MD  vitamin C (ASCORBIC ACID) 500 MG tablet Take 500 mg by mouth daily.   Yes Historical Provider, MD      PHYSICAL EXAMINATION:   VITAL SIGNS: Blood pressure 136/80, pulse 76, temperature 97.6 F (36.4 C), temperature source Oral, resp. rate 20, height 5\' 5"  (1.651 m), weight 99.8 kg (220 lb), SpO2 98 %.  GENERAL:  60 y.o.-year-old patient lying in the bed with no acute distress.  EYES: Pupils equal, round, reactive to light and accommodation. No scleral icterus. Extraocular muscles intact.  HEENT: Head atraumatic, normocephalic. Oropharynx dry and nasopharynx clear.  NECK:  Supple, no jugular venous distention. No thyroid enlargement, no tenderness.  LUNGS: Normal breath sounds bilaterally, no wheezing, rales,rhonchi or crepitation. No use of accessory muscles of respiration.  CARDIOVASCULAR: S1, S2 normal. No murmurs, rubs, or gallops.  ABDOMEN: Soft, nontender, nondistended. Bowel sounds present. No organomegaly or mass.  EXTREMITIES: No pedal edema, cyanosis, or clubbing.  NEUROLOGIC: Cranial nerves II through XII are intact. Muscle strength 5/5 in all extremities. Sensation intact. Gait not checked.  PSYCHIATRIC: The patient is alert and oriented x 3.  SKIN: No obvious  rash, lesion, or ulcer.   LABORATORY PANEL:   CBC  Recent Labs Lab 05/15/16 0031  WBC 8.0  HGB 11.0*  HCT 33.0*  PLT 330  MCV 87.8  MCH 29.3  MCHC 33.4  RDW 15.2*  LYMPHSABS 0.8*  MONOABS 0.5  EOSABS 0.0  BASOSABS 0.0   ------------------------------------------------------------------------------------------------------------------  Chemistries   Recent Labs Lab 05/15/16 0031  NA 134*  K 4.3  CL 98*  CO2 29  GLUCOSE 42*  BUN 28*  CREATININE 1.43*  CALCIUM 10.0   ------------------------------------------------------------------------------------------------------------------ estimated creatinine clearance is 49.6 mL/min (by C-G formula based on SCr of 1.43 mg/dL (H)). ------------------------------------------------------------------------------------------------------------------ No results for input(s): TSH, T4TOTAL, T3FREE, THYROIDAB in the last 72 hours.  Invalid input(s): FREET3   Coagulation profile No results for input(s): INR, PROTIME in the last 168 hours. ------------------------------------------------------------------------------------------------------------------- No results for input(s): DDIMER in the last 72 hours. -------------------------------------------------------------------------------------------------------------------  Cardiac Enzymes No results for input(s): CKMB, TROPONINI, MYOGLOBIN in the last 168 hours.  Invalid input(s): CK ------------------------------------------------------------------------------------------------------------------ Invalid input(s): POCBNP  ---------------------------------------------------------------------------------------------------------------  Urinalysis    Component Value Date/Time   COLORURINE STRAW (A) 05/15/2016  0135   APPEARANCEUR CLEAR (A) 05/15/2016 0135   LABSPEC 1.010 05/15/2016 0135   PHURINE 7.0 05/15/2016 0135   GLUCOSEU NEGATIVE 05/15/2016 0135   HGBUR NEGATIVE 05/15/2016  0135   BILIRUBINUR NEGATIVE 05/15/2016 0135   KETONESUR NEGATIVE 05/15/2016 0135   PROTEINUR NEGATIVE 05/15/2016 0135   NITRITE NEGATIVE 05/15/2016 0135   LEUKOCYTESUR 1+ (A) 05/15/2016 0135     RADIOLOGY: No results found.  EKG: Orders placed or performed during the hospital encounter of 05/15/16  . ED EKG  . ED EKG  . EKG 12-Lead  . EKG 12-Lead    IMPRESSION AND PLAN: 60 year old female patient with history of type 2 diabetes mellitus, hypertension, developmental retardation presented to the emergency room with cold clammy skin as well as transient disorientation. Patient blood sugars were low. Admitting diagnosis 1. Hypoglycemia 2. Transient disorientation secondary to hypoglycemia 3. Mild hyponatremia 4. Dehydration Treatment plan Mid patient to medical floor observation bed IV D5 normal saline fluid hydration Hold her diabetic medications Monitor blood sugars closely Follow-up electrolytes and renal function Supportive care  All the records are reviewed and case discussed with ED provider. Management plans discussed with the patient, family and they are in agreement.  CODE STATUS:FULL CODE Code Status History    Date Active Date Inactive Code Status Order ID Comments User Context   12/18/2015  3:55 PM 12/20/2015  4:17 PM Full Code [id number]  Katharina Caper, MD Inpatient    Advance Directive Documentation   Flowsheet Row Most Recent Value  Type of Advance Directive  Healthcare Power of Attorney  Pre-existing out of facility DNR order (yellow form or pink MOST form)  No data  "MOST" Form in Place?  No data       TOTAL TIME TAKING CARE OF THIS PATIENT: 53 minutes.    Ihor Austin M.D on 05/15/2016 at 3:24 AM  Between 7am to 6pm - Pager - [phone]  After 6pm go to [email or web address] - password [email or web address] [facility]  [facility]  Office  [phone]  CC: Primary care physician; Evelene Croon, MD

## 2016-05-15 NOTE — Progress Notes (Signed)
CH rounding the unit visited Pt who was in the room. Pt was relaxing and told Ch, she did not need a CH's visit.     05/15/16 1400  Clinical Encounter Type  Visited With Patient  Visit Type Initial  Referral From Nurse  Consult/Referral To Chaplain  Spiritual Encounters  Spiritual Needs Prayer;Emotional

## 2016-05-15 NOTE — ED Notes (Signed)
Pt denies further needs at this time. Family at bedside. resps unlabored. Call bell at side, no s/s of infiltration to iv site.

## 2016-05-15 NOTE — Progress Notes (Signed)
New Admission Note:   Arrival Method: per stretcher from ED, pt came from home Mental Orientation: alert and oriented X4, speech a bit slurred and garbled, pt is developmentally delayed Telemetry: none ordered Assessment: Completed Skin: warm, dry, intact, no wounds or sores noted, within defined limits IV: G20 on the right AC with transparent dressing, intact Pain: denies having any pain this time Safety Measures: Safety Fall Prevention Plan has been given and discussed Admission: Completed 1A Orientation: Patient has been oriented to the room, unit and staff.  Family: no family member at bedside this time  Orders have been reviewed and implemented. Will continue to monitor the patient. Call light has been placed within reach and bed alarm has been activated.   Janice Norrie BSN, RN [facility] 1A

## 2016-05-15 NOTE — Evaluation (Signed)
Physical Therapy Evaluation Patient Details Name: Emma KohBarbara A Haberle MRN: [id number] DOB: Jan 03, 1956 Today's Date: 05/15/2016   History of Present Illness  Patient is a 60 y/o female that presents after hypo-glycemic episode at home, treated with D5 water and able to tolerate meals. On 11/7 underwent excisional debridement of painful callus on 5th R metatarsal tubercle.   Clinical Impression  Patient is a very pleasant 60 y/o female brought in by family due to hypo-glyemic episode at home. She is independent at baseline with no reports of falls. She had an excision of a painful callus on her R lateral foot 3 weeks ago and denies any pain currently. Gross neurologic screen indicates patient has good sensation throughout her LEs. Gait pattern and transfers appear to be at her baseline. No further therapy needs demonstrated, will discontinue order and therapy services. Please re-consult if there is a change in her status.     Follow Up Recommendations No PT follow up    Equipment Recommendations       Recommendations for Other Services       Precautions / Restrictions Precautions Precautions: None Restrictions Weight Bearing Restrictions: No      Mobility  Bed Mobility Overal bed mobility: Independent             General bed mobility comments: No deficits identified.   Transfers Overall transfer level: Independent               General transfer comment: Swift transfer, no use of UEs.   Ambulation/Gait Ambulation/Gait assistance: Independent Ambulation Distance (Feet): 200 Feet   Gait Pattern/deviations: WFL(Within Functional Limits)   Gait velocity interpretation: at or above normal speed for age/gender General Gait Details: Patient with very mild loss of hip extension bilaterally, no sway or decrease in speed from anticipated baseline.   Stairs            Wheelchair Mobility    Modified Rankin (Stroke Patients Only)       Balance Overall balance  assessment: Independent                                           Pertinent Vitals/Pain Pain Assessment: No/denies pain    Home Living Family/patient expects to be discharged to:: Private residence Living Arrangements: Parent Available Help at Discharge: Family Type of Home: House Home Access: Stairs to enter Entrance Stairs-Rails: Can reach both Entrance Stairs-Number of Steps: 3 Home Layout: One level   Additional Comments: Patient is independent at baseline, reports she has not had any falls.     Prior Function Level of Independence: Independent               Hand Dominance        Extremity/Trunk Assessment   Upper Extremity Assessment: Overall WFL for tasks assessed           Lower Extremity Assessment: Overall WFL for tasks assessed         Communication   Communication:  (Difficult to understand due to lisp, chronic)  Cognition Arousal/Alertness: Awake/alert Behavior During Therapy: WFL for tasks assessed/performed Overall Cognitive Status: History of cognitive impairments - at baseline (Per medical chart patient has a development delay condition, she appears at baseline.)                      General Comments General comments (skin integrity, edema, etc.):  Per sister present her LE swelling has decreased. Per patient on gross sensory exam, no loss of sensation in her feet or LEs.     Exercises     Assessment/Plan    PT Assessment Patent does not need any further PT services  PT Problem List            PT Treatment Interventions      PT Goals (Current goals can be found in the Care Plan section)       Frequency     Barriers to discharge        Co-evaluation               End of Session   Activity Tolerance: Patient tolerated treatment well Patient left: in chair;with call bell/phone within reach;with family/visitor present Nurse Communication: Mobility status (Patient dc'd from therapy, does not  have alarm on)    Functional Assessment Tool Used: Clinical judgement  Functional Limitation: Mobility: Walking and moving around Mobility: Walking and Moving Around Current Status [id number]): 0 percent impaired, limited or restricted Mobility: Walking and Moving Around Goal Status [phone]): 0 percent impaired, limited or restricted Mobility: Walking and Moving Around Discharge Status [phone]): 0 percent impaired, limited or restricted    Time: 1745-1800 PT Time Calculation (min) (ACUTE ONLY): 15 min   Charges:   PT Evaluation $PT Eval Low Complexity: 1 Procedure     PT G Codes:   PT G-Codes **NOT FOR INPATIENT CLASS** Functional Assessment Tool Used: Clinical judgement  Functional Limitation: Mobility: Walking and moving around Mobility: Walking and Moving Around Current Status (M0102): 0 percent impaired, limited or restricted Mobility: Walking and Moving Around Goal Status (V2536): 0 percent impaired, limited or restricted Mobility: Walking and Moving Around Discharge Status (U4403): 0 percent impaired, limited or restricted   Kerin Ransom, PT, DPT    05/15/2016, 6:09 PM

## 2016-05-15 NOTE — Progress Notes (Signed)
[facility] [place] [facility]   PATIENT NAME: Emma BernardBarbara Fletcher    MR#:  [id number]  DATE OF BIRTH:  09/07/1955  SUBJECTIVE:  CHIEF COMPLAINT:   Chief Complaint  Patient presents with  . Hypoglycemia  The patient is 60 year old female with medical history significant for history of reservation, cardiomyopathy, diabetes mellitus type 2, hyperlipidemia, hypertension, who presents to the hospital with disorientation, feeling sweaty, cold and clammy. Her blood glucose levels were found to be low and patient was admitted. He was initiated on D5 water solution at her blood glucose levels improved. She denies any nausea, vomiting or decreased oral intake. She was able to eat 100% of offered meals while in the hospital.  Review of Systems  Constitutional: Negative for chills, fever and weight loss.  HENT: Negative for congestion.   Eyes: Negative for blurred vision and double vision.  Respiratory: Negative for cough, sputum production, shortness of breath and wheezing.   Cardiovascular: Negative for chest pain, palpitations, orthopnea, leg swelling and PND.  Gastrointestinal: Negative for abdominal pain, blood in stool, constipation, diarrhea, nausea and vomiting.  Genitourinary: Negative for dysuria, frequency, hematuria and urgency.  Musculoskeletal: Negative for falls.  Neurological: Negative for dizziness, tremors, focal weakness and headaches.  Endo/Heme/Allergies: Does not bruise/bleed easily.  Psychiatric/Behavioral: Negative for depression. The patient does not have insomnia.     VITAL SIGNS: Blood pressure 124/67, pulse 71, temperature 98.4 F (36.9 C), temperature source Oral, resp. rate 16, height 5\' 5"  (1.651 m), weight 99 kg (218 lb 3.2 oz), SpO2 100 %.  PHYSICAL EXAMINATION:   GENERAL:  60 y.o.-year-old patient lying in the bed with no acute distress.  EYES: Pupils equal, round, reactive to light and accommodation. No scleral icterus. Extraocular  muscles intact.  HEENT: Head atraumatic, normocephalic. Oropharynx and nasopharynx clear.  NECK:  Supple, no jugular venous distention. No thyroid enlargement, no tenderness.  LUNGS: Normal breath sounds bilaterally, no wheezing, rales,rhonchi or crepitation. No use of accessory muscles of respiration.  CARDIOVASCULAR: S1, S2 normal. No murmurs, rubs, or gallops.  ABDOMEN: Soft, nontender, nondistended. Bowel sounds present. No organomegaly or mass.  EXTREMITIES: No pedal edema, cyanosis, or clubbing.  NEUROLOGIC: Cranial nerves II through XII are intact. Muscle strength 5/5 in all extremities. Sensation intact. Gait not checked.  PSYCHIATRIC: The patient is alert and oriented x 3.  SKIN: No obvious rash, lesion, or ulcer.   ORDERS/RESULTS REVIEWED:   CBC  Recent Labs Lab 05/15/16 0031  WBC 8.0  HGB 11.0*  HCT 33.0*  PLT 330  MCV 87.8  MCH 29.3  MCHC 33.4  RDW 15.2*  LYMPHSABS 0.8*  MONOABS 0.5  EOSABS 0.0  BASOSABS 0.0   ------------------------------------------------------------------------------------------------------------------  Chemistries   Recent Labs Lab 05/15/16 0031  NA 134*  K 4.3  CL 98*  CO2 29  GLUCOSE 42*  BUN 28*  CREATININE 1.43*  CALCIUM 10.0   ------------------------------------------------------------------------------------------------------------------ estimated creatinine clearance is 49.4 mL/min (by C-G formula based on SCr of 1.43 mg/dL (H)). ------------------------------------------------------------------------------------------------------------------ No results for input(s): TSH, T4TOTAL, T3FREE, THYROIDAB in the last 72 hours.  Invalid input(s): FREET3  Cardiac Enzymes No results for input(s): CKMB, TROPONINI, MYOGLOBIN in the last 168 hours.  Invalid input(s): CK ------------------------------------------------------------------------------------------------------------------ Invalid input(s):  POCBNP ---------------------------------------------------------------------------------------------------------------  RADIOLOGY: No results found.  EKG:  Orders placed or performed during the hospital encounter of 05/15/16  . ED EKG  . ED EKG  . EKG 12-Lead  . EKG 12-Lead    ASSESSMENT AND PLAN:  Active Problems:   Hypoglycemia #1. Hypoglycemia, discontinue Amaryl, initiate patient on half dose of tradjenta tomorrow morning, following blood glucose levels closely, get hemoglobin A1c, appreciate diabetes coordinator's input #2. Hyponatremia, reassess sodium level in the morning #3. Acute on chronic renal insufficiency, follow creatinine in the morning #4 anemia, get Hemoccult, no active bleeding noted    Management plans discussed with the patient, family and they are in agreement.   DRUG ALLERGIES: No Known Allergies  CODE STATUS:     Code Status Orders        Start     Ordered   05/15/16 0433  Full code  Continuous     05/15/16 0432    Code Status History    Date Active Date Inactive Code Status Order ID Comments User Context   12/18/2015  3:55 PM 12/20/2015  4:17 PM Full Code [id number]  Katharina Caper, MD Inpatient    Advance Directive Documentation   Flowsheet Row Most Recent Value  Type of Advance Directive  Healthcare Power of Attorney  Pre-existing out of facility DNR order (yellow form or pink MOST form)  No data  "MOST" Form in Place?  No data      TOTAL TIME TAKING CARE OF THIS PATIENT: 40 minutes.    Katharina Caper M.D on 05/15/2016 at 4:48 PM  Between 7am to 6pm - Pager - [phone]  After 6pm go to [email or web address] - password [email or web address]  [facility] [place] [facility]  Office  [phone]  CC: Primary care physician; Evelene Croon, MD

## 2016-05-15 NOTE — ED Triage Notes (Signed)
Pt arrived from home with c/o low blood sugars since this past evening; friend was monitoring sugars which were anywhere from 38-54; pt consumed a candy bar, orange juice, peanut butter sandwich and cake icing; upon arrival to ED pt vomited large amount; felt better after vomiting; skin cool and clammy; pt alert and oriented x 3; talking in complete sentences

## 2016-05-16 DIAGNOSIS — E11649 Type 2 diabetes mellitus with hypoglycemia without coma: Secondary | ICD-10-CM | POA: Diagnosis not present

## 2016-05-16 DIAGNOSIS — N182 Chronic kidney disease, stage 2 (mild): Secondary | ICD-10-CM

## 2016-05-16 LAB — URINE CULTURE

## 2016-05-16 LAB — GLUCOSE, CAPILLARY
Glucose-Capillary: 132 mg/dL — ABNORMAL HIGH (ref 65–99)
Glucose-Capillary: 150 mg/dL — ABNORMAL HIGH (ref 65–99)

## 2016-05-16 LAB — HEMOGLOBIN A1C
HEMOGLOBIN A1C: 6.2 % — AB (ref 4.8–5.6)
MEAN PLASMA GLUCOSE: 131 mg/dL

## 2016-05-16 MED ORDER — ALUM & MAG HYDROXIDE-SIMETH 200-200-20 MG/5ML PO SUSP: 15.0000 mL | ORAL | 0 refills | Status: DC | PRN

## 2016-05-16 MED ORDER — LINAGLIPTIN 5 MG PO TABS
2.5000 mg | ORAL_TABLET | Freq: Every day | ORAL | 6 refills | Status: DC
Start: 2016-05-17 — End: 2017-09-20

## 2016-05-16 MED ORDER — CALCIUM CARBONATE-VITAMIN D 500-200 MG-UNIT PO TABS: 1.0000 | ORAL_TABLET | Freq: Every day | ORAL | 5 refills | Status: DC

## 2016-05-16 NOTE — Discharge Summary (Signed)
[facility] [place] [facility]   PATIENT NAME: Emma BernardBarbara Fletcher    MR#:  [id number]  DATE OF BIRTH:  01-Dec-1955  DATE OF ADMISSION:  05/15/2016 ADMITTING PHYSICIAN: Ihor AustinPavan Pyreddy, MD  DATE OF DISCHARGE: 05/16/2016 11:04 AM  PRIMARY CARE PHYSICIAN: Evelene CroonNIEMEYER, MEINDERT, MD     ADMISSION DIAGNOSIS:  Capsulitis of right ankle [M77.51] Urinary tract infection without hematuria, site unspecified [N39.0] Acute right ankle pain [M25.571] Synovitis of right ankle [M65.9] Hypoglycemia due to type 2 diabetes mellitus (HCC) [E11.649]  DISCHARGE DIAGNOSIS:  Principal Problem:   Hypoglycemia Active Problems:   Hyponatremia   Anemia   CKD (chronic kidney disease), stage II   SECONDARY DIAGNOSIS:   Past Medical History:  Diagnosis Date  . Anemia   . Cardiomyopathy secondary   . Developmental delay   . Diabetes mellitus without complication (HCC)   . Edema   . Hypercholesterolemia   . Unspecified essential hypertension     .pro HOSPITAL COURSE:   The patient is 60 year old female with medical history significant for history of Developmental retardation, cardiomyopathy, diabetes mellitus type 2, hyperlipidemia, hypertension, who presents to the hospital with disorientation, feeling sweaty, cold and clammy. Her blood glucose levels were found to be low and patient was admitted. She was initiated on D5 water solution at her blood glucose levels improved. Patient had hemoglobin A1c checked and it was found to be relatively low at 6.2.  She had no nausea, vomiting or decreased oral intake. She was able to eat 100% of offered meals while in the hospital. Patient's diabetic medications were adjusted, discontinuing glimepiride and Actos, continue half dose of Tradjenta, this was discussed with patient and family who voiced agreement and understanding. Patient was evaluated by physical therapist and no physical therapy follow-up was recommended. Patient was felt to be  stable and safe to be discharged home. Patient's blood glucose levels on the day of discharge were ranging between 116-170. Discussion by problem: #1. Hypoglycemia in diabetic, discontinue Amaryl and Actos, initiate patient on half dose of tradjenta, following blood glucose levels closely as outpatient, hemoglobin A1c was checked, was found to be 6.2, patient was advised to follow-up with primary care physician for further recommendations. #2. Hyponatremia, sodium level remained low at 134 despite IV fluid administration, it is recommended to follow patient's sodium level as outpatient  #3. Acute on chronic renal insufficiency, creatinine has improved with IV fluid administration to 1.06  #4 anemia, Hemoccult was ordered, not received, but no active bleeding was noted while patient  was in the hospital #6. Abnormal urinalysis with 1+ leukocyte esterase, normal white cell count, urine culture was taken, revealed multiple species, recollection was suggested, although patient was asymptomatic with no elevated white blood cell count or fevers, we made decision not to have urine cultures retaken. However, we recommend urine cultures if patient becomes symptomatic.  DISCHARGE CONDITIONS:   Stable  CONSULTS OBTAINED:    DRUG ALLERGIES:  No Known Allergies  DISCHARGE MEDICATIONS:   Discharge Medication List as of 05/16/2016 10:51 AM    START taking these medications   Details  alum & mag hydroxide-simeth (MAALOX/MYLANTA) 200-200-20 MG/5ML suspension Take 15 mLs by mouth every 4 (four) hours as needed for indigestion or heartburn., Starting Tue 05/16/2016, Normal    calcium-vitamin D (OSCAL WITH D) 500-200 MG-UNIT tablet Take 1 tablet by mouth daily., Starting Wed 05/17/2016, Normal      CONTINUE these medications which have CHANGED   Details  linagliptin (TRADJENTA) 5 MG  TABS tablet Take 0.5 tablets (2.5 mg total) by mouth daily., Starting Wed 05/17/2016, Normal      CONTINUE these  medications which have NOT CHANGED   Details  alendronate (FOSAMAX) 70 MG tablet Take 1 tablet by mouth once a week., Starting Mon 05/08/2016, Historical Med    budesonide-formoterol (SYMBICORT) 160-4.5 MCG/ACT inhaler Inhale 2 puffs into the lungs 2 (two) times daily., Historical Med    Calcium Carb-Cholecalciferol (CALCIUM 600+D) 600-800 MG-UNIT TABS Take 1 tablet by mouth daily., Historical Med    Choline Fenofibrate (FENOFIBRIC ACID) 135 MG CPDR Take 135 mg by mouth daily. , Starting Mon 07/19/2015, Historical Med    furosemide (LASIX) 20 MG tablet Take 20 mg by mouth daily.  , Historical Med    Iron, Ferrous Sulfate, 142 (45 Fe) MG TBCR Take 90 mg of iron by mouth daily., Historical Med    lansoprazole (PREVACID) 30 MG capsule Take 30 mg by mouth daily. , Starting Tue 06/01/2015, Historical Med    levothyroxine (SYNTHROID, LEVOTHROID) 100 MCG tablet Take 100 mcg by mouth daily.  , Historical Med    lisinopril (PRINIVIL,ZESTRIL) 20 MG tablet Take 20 mg by mouth daily.  , Historical Med    Multiple Vitamins-Minerals (ONE-A-DAY WOMENS 50 PLUS PO) Take 1 tablet by mouth daily., Historical Med    potassium chloride (KLOR-CON) 10 MEQ CR tablet Take 10 mEq by mouth daily.  , Historical Med    SPIRIVA HANDIHALER 18 MCG inhalation capsule Place 18 mcg into inhaler and inhale daily. , Starting Mon 07/19/2015, Historical Med    VENTOLIN HFA 108 (90 Base) MCG/ACT inhaler Take 2 puffs by mouth every 4 (four) hours as needed., Starting Thu 05/04/2016, Historical Med    vitamin C (ASCORBIC ACID) 500 MG tablet Take 500 mg by mouth daily., Historical Med      STOP taking these medications     glimepiride (AMARYL) 2 MG tablet      pioglitazone (ACTOS) 15 MG tablet          DISCHARGE INSTRUCTIONS:    Patient is to follow-up with primary care physician as outpatient within 1 week after discharge  If you experience worsening of your admission symptoms, develop shortness of breath, life  threatening emergency, suicidal or homicidal thoughts you must seek medical attention immediately by calling 911 or calling your MD immediately  if symptoms less severe.  You Must read complete instructions/literature along with all the possible adverse reactions/side effects for all the Medicines you take and that have been prescribed to you. Take any new Medicines after you have completely understood and accept all the possible adverse reactions/side effects.   Please note  You were cared for by a hospitalist during your hospital stay. If you have any questions about your discharge medications or the care you received while you were in the hospital after you are discharged, you can call the unit and asked to speak with the hospitalist on call if the hospitalist that took care of you is not available. Once you are discharged, your primary care physician will handle any further medical issues. Please note that NO REFILLS for any discharge medications will be authorized once you are discharged, as it is imperative that you return to your primary care physician (or establish a relationship with a primary care physician if you do not have one) for your aftercare needs so that they can reassess your need for medications and monitor your lab values.    Today   CHIEF COMPLAINT:  Chief Complaint  Patient presents with  . Hypoglycemia    HISTORY OF PRESENT ILLNESS:  Emma Fletcher  is a 60 y.o. female with a known history of Developmental retardation, cardiomyopathy, diabetes mellitus type 2, hyperlipidemia, hypertension, who presents to the hospital with disorientation, feeling sweaty, cold and clammy. Her blood glucose levels were found to be low and patient was admitted. She was initiated on D5 water solution at her blood glucose levels improved. Patient had hemoglobin A1c checked and it was found to be relatively low at 6.2.  She had no nausea, vomiting or decreased oral intake. She was able to eat  100% of offered meals while in the hospital. Patient's diabetic medications were adjusted, discontinuing glimepiride and Actos, continue half dose of Tradjenta, this was discussed with patient and family who voiced agreement and understanding. Patient was evaluated by physical therapist and no physical therapy follow-up was recommended. Patient was felt to be stable and safe to be discharged home. Patient's blood glucose levels on the day of discharge were ranging between 116-170. Discussion by problem: #1. Hypoglycemia in diabetic, discontinue Amaryl and Actos, initiate patient on half dose of tradjenta, following blood glucose levels closely as outpatient, hemoglobin A1c was checked, was found to be 6.2, patient was advised to follow-up with primary care physician for further recommendations. #2. Hyponatremia, sodium level remained low at 134 despite IV fluid administration, it is recommended to follow patient's sodium level as outpatient  #3. Acute on chronic renal insufficiency, creatinine has improved with IV fluid administration to 1.06  #4 anemia, Hemoccult was ordered, not received, but no active bleeding was noted while patient  was in the hospital #6. Abnormal urinalysis with 1+ leukocyte esterase, normal white cell count, urine culture was taken, revealed multiple species, recollection was suggested, although patient was asymptomatic with no elevated white blood cell count or fevers, we made decision not to have urine cultures retaken. However, we recommend urine cultures if patient becomes symptomatic.     VITAL SIGNS:  Blood pressure 128/73, pulse 78, temperature 98.8 F (37.1 C), resp. rate 18, height 5\' 5"  (1.651 m), weight 99 kg (218 lb 3.2 oz), SpO2 97 %.  I/O:   Intake/Output Summary (Last 24 hours) at 05/16/16 1706 Last data filed at 05/16/16 0900  Gross per 24 hour  Intake              720 ml  Output                0 ml  Net              720 ml    PHYSICAL EXAMINATION:   GENERAL:  60 y.o.-year-old patient lying in the bed with no acute distress.  EYES: Pupils equal, round, reactive to light and accommodation. No scleral icterus. Extraocular muscles intact.  HEENT: Head atraumatic, normocephalic. Oropharynx and nasopharynx clear.  NECK:  Supple, no jugular venous distention. No thyroid enlargement, no tenderness.  LUNGS: Normal breath sounds bilaterally, no wheezing, rales,rhonchi or crepitation. No use of accessory muscles of respiration.  CARDIOVASCULAR: S1, S2 normal. No murmurs, rubs, or gallops.  ABDOMEN: Soft, non-tender, non-distended. Bowel sounds present. No organomegaly or mass.  EXTREMITIES: No pedal edema, cyanosis, or clubbing.  NEUROLOGIC: Cranial nerves II through XII are intact. Muscle strength 5/5 in all extremities. Sensation intact. Gait not checked.  PSYCHIATRIC: The patient is alert and oriented x 3.  SKIN: No obvious rash, lesion, or ulcer.   DATA REVIEW:   CBC  Recent Labs Lab 05/15/16 0031  WBC 8.0  HGB 11.0*  HCT 33.0*  PLT 330    Chemistries   Recent Labs Lab 05/15/16 1617  NA 134*  K 4.5  CL 103  CO2 25  GLUCOSE 136*  BUN 23*  CREATININE 1.06*  CALCIUM 9.5    Cardiac Enzymes No results for input(s): TROPONINI in the last 168 hours.  Microbiology Results  Results for orders placed or performed during the hospital encounter of 05/15/16  Urine culture     Status: Abnormal   Collection Time: 05/15/16  1:35 AM  Result Value Ref Range Status   Specimen Description URINE, RANDOM  Final   Special Requests NONE  Final   Culture MULTIPLE SPECIES PRESENT, SUGGEST RECOLLECTION (A)  Final   Report Status 05/16/2016 FINAL  Final    RADIOLOGY:  No results found.  EKG:   Orders placed or performed during the hospital encounter of 05/15/16  . ED EKG  . ED EKG  . EKG 12-Lead  . EKG 12-Lead      Management plans discussed with the patient, family and they are in agreement.  CODE STATUS:  Code Status  History    Date Active Date Inactive Code Status Order ID Comments User Context   05/15/2016  4:32 AM 05/16/2016  2:17 PM Full Code [id number]  Ihor Austin, MD Inpatient   12/18/2015  3:55 PM 12/20/2015  4:17 PM Full Code [id number]  Katharina Caper, MD Inpatient    Advance Directive Documentation   Flowsheet Row Most Recent Value  Type of Advance Directive  Healthcare Power of Attorney  Pre-existing out of facility DNR order (yellow form or pink MOST form)  No data  "MOST" Form in Place?  No data      TOTAL TIME TAKING CARE OF THIS PATIENT: 40 minutes.    Katharina Caper M.D on 05/16/2016 at 5:06 PM  Between 7am to 6pm - Pager - [phone]  After 6pm go to [email or web address] - password [email or web address] [facility]  [facility]  Office  [phone]  CC: Primary care physician; Evelene Croon, MD

## 2016-05-16 NOTE — Progress Notes (Signed)
Pt being discharged home today. PIV removed. Discharge instructions reviewed with pt, all questions answered. Prescriptions sent to pharmacy for pt to pick up, she will follow up with PCP. She is leaving with all her belongings. Will be transported home via family member.

## 2016-06-21 DIAGNOSIS — E784 Other hyperlipidemia: Secondary | ICD-10-CM | POA: Diagnosis not present

## 2016-06-21 DIAGNOSIS — E7800 Pure hypercholesterolemia, unspecified: Secondary | ICD-10-CM | POA: Diagnosis not present

## 2016-06-21 DIAGNOSIS — N189 Chronic kidney disease, unspecified: Secondary | ICD-10-CM | POA: Diagnosis not present

## 2016-06-21 DIAGNOSIS — R5383 Other fatigue: Secondary | ICD-10-CM | POA: Diagnosis not present

## 2016-06-21 DIAGNOSIS — E119 Type 2 diabetes mellitus without complications: Secondary | ICD-10-CM | POA: Diagnosis not present

## 2016-06-21 DIAGNOSIS — E039 Hypothyroidism, unspecified: Secondary | ICD-10-CM | POA: Diagnosis not present

## 2016-06-21 DIAGNOSIS — E559 Vitamin D deficiency, unspecified: Secondary | ICD-10-CM | POA: Diagnosis not present

## 2016-07-12 DIAGNOSIS — M79604 Pain in right leg: Secondary | ICD-10-CM | POA: Diagnosis not present

## 2016-07-12 DIAGNOSIS — T148XXA Other injury of unspecified body region, initial encounter: Secondary | ICD-10-CM | POA: Diagnosis not present

## 2016-07-24 ENCOUNTER — Ambulatory Visit (INDEPENDENT_AMBULATORY_CARE_PROVIDER_SITE_OTHER): Payer: Medicare Other | Admitting: Podiatry

## 2016-07-24 ENCOUNTER — Encounter: Payer: Self-pay | Admitting: Podiatry

## 2016-07-24 DIAGNOSIS — E119 Type 2 diabetes mellitus without complications: Secondary | ICD-10-CM

## 2016-07-24 DIAGNOSIS — B351 Tinea unguium: Secondary | ICD-10-CM | POA: Diagnosis not present

## 2016-07-24 DIAGNOSIS — M659 Synovitis and tenosynovitis, unspecified: Secondary | ICD-10-CM | POA: Diagnosis not present

## 2016-07-24 DIAGNOSIS — M79672 Pain in left foot: Secondary | ICD-10-CM

## 2016-07-24 DIAGNOSIS — M79671 Pain in right foot: Secondary | ICD-10-CM

## 2016-07-24 NOTE — Progress Notes (Signed)
This patient returns to the office follow-up for treatment of her long thick nails. Patient is diabetic without any complications. He says the nails are painful as she walks and wears her shoes. She also says that she has had pain in her right ankle as she walks. Patient states there is no swelling noted and no trauma noted to her right ankle. She presents the office today for an evaluation and treatment of her  GENERAL APPEARANCE: Alert, conversant. Appropriately groomed. No acute distress.  VASCULAR: Pedal pulses are  palpable at  [facility] and PT bilateral.  Capillary refill time is immediate to all digits,  Normal temperature gradient.  Digital hair growth is present bilateral  NEUROLOGIC: sensation is normal to 5.07 monofilament at 5/5 sites bilateral.  Light touch is intact bilateral, Muscle strength normal.  MUSCULOSKELETAL: acceptable muscle strength, tone and stability bilateral.  Intrinsic muscluature intact bilateral.  Rectus appearance of foot and digits noted bilateral. Palpable pain sinus tarsi right ankle.  DERMATOLOGIC: skin color, texture, and turgor are within normal limits.  No preulcerative lesions or ulcers  are seen, no interdigital maceration noted.  No open lesions present.  . No drainage noted.  NAILS  Thick disfigured discolored nails both feet  Onychomycosis  DM with no complications  Sinus tarsitis right ankle  ROV  Debridement of nails  Injection therapy right sinus tarsi  RTC 3 months for nail care.   Helane Gunther DPM

## 2016-07-27 ENCOUNTER — Ambulatory Visit: Payer: Medicare Other | Admitting: Podiatry

## 2016-08-22 ENCOUNTER — Ambulatory Visit: Payer: Medicare Other | Admitting: Oncology

## 2016-08-24 ENCOUNTER — Ambulatory Visit: Payer: Medicare Other | Admitting: Oncology

## 2016-08-28 ENCOUNTER — Inpatient Hospital Stay: Payer: Medicare Other | Attending: Oncology | Admitting: Oncology

## 2016-08-28 ENCOUNTER — Encounter: Payer: Self-pay | Admitting: Oncology

## 2016-08-28 ENCOUNTER — Inpatient Hospital Stay: Payer: Medicare Other

## 2016-08-28 VITALS — BP 99/64 | HR 75 | Temp 97.4°F | Resp 18 | Ht 65.0 in | Wt 212.7 lb

## 2016-08-28 DIAGNOSIS — E669 Obesity, unspecified: Secondary | ICD-10-CM | POA: Diagnosis not present

## 2016-08-28 DIAGNOSIS — Z79899 Other long term (current) drug therapy: Secondary | ICD-10-CM | POA: Insufficient documentation

## 2016-08-28 DIAGNOSIS — I129 Hypertensive chronic kidney disease with stage 1 through stage 4 chronic kidney disease, or unspecified chronic kidney disease: Secondary | ICD-10-CM | POA: Diagnosis not present

## 2016-08-28 DIAGNOSIS — R609 Edema, unspecified: Secondary | ICD-10-CM | POA: Diagnosis not present

## 2016-08-28 DIAGNOSIS — G4730 Sleep apnea, unspecified: Secondary | ICD-10-CM | POA: Diagnosis not present

## 2016-08-28 DIAGNOSIS — E039 Hypothyroidism, unspecified: Secondary | ICD-10-CM | POA: Diagnosis not present

## 2016-08-28 DIAGNOSIS — J449 Chronic obstructive pulmonary disease, unspecified: Secondary | ICD-10-CM | POA: Diagnosis not present

## 2016-08-28 DIAGNOSIS — E7800 Pure hypercholesterolemia, unspecified: Secondary | ICD-10-CM | POA: Diagnosis not present

## 2016-08-28 DIAGNOSIS — N182 Chronic kidney disease, stage 2 (mild): Secondary | ICD-10-CM | POA: Diagnosis not present

## 2016-08-28 DIAGNOSIS — Z87440 Personal history of urinary (tract) infections: Secondary | ICD-10-CM | POA: Insufficient documentation

## 2016-08-28 DIAGNOSIS — N189 Chronic kidney disease, unspecified: Secondary | ICD-10-CM

## 2016-08-28 DIAGNOSIS — D631 Anemia in chronic kidney disease: Secondary | ICD-10-CM | POA: Diagnosis not present

## 2016-08-28 DIAGNOSIS — D509 Iron deficiency anemia, unspecified: Secondary | ICD-10-CM

## 2016-08-28 DIAGNOSIS — E871 Hypo-osmolality and hyponatremia: Secondary | ICD-10-CM | POA: Diagnosis not present

## 2016-08-28 DIAGNOSIS — I428 Other cardiomyopathies: Secondary | ICD-10-CM | POA: Diagnosis not present

## 2016-08-28 LAB — IRON AND TIBC
IRON: 42 ug/dL (ref 28–170)
SATURATION RATIOS: 12 % (ref 10.4–31.8)
TIBC: 360 ug/dL (ref 250–450)
UIBC: 318 ug/dL

## 2016-08-28 LAB — VITAMIN B12: Vitamin B-12: 363 pg/mL (ref 180–914)

## 2016-08-28 LAB — COMPREHENSIVE METABOLIC PANEL
ALT: 14 U/L (ref 14–54)
AST: 18 U/L (ref 15–41)
Albumin: 3.9 g/dL (ref 3.5–5.0)
Alkaline Phosphatase: 80 U/L (ref 38–126)
Anion gap: 7 (ref 5–15)
BILIRUBIN TOTAL: 0.1 mg/dL — AB (ref 0.3–1.2)
BUN: 27 mg/dL — ABNORMAL HIGH (ref 6–20)
CHLORIDE: 91 mmol/L — AB (ref 101–111)
CO2: 27 mmol/L (ref 22–32)
CREATININE: 1 mg/dL (ref 0.44–1.00)
Calcium: 9 mg/dL (ref 8.9–10.3)
Glucose, Bld: 128 mg/dL — ABNORMAL HIGH (ref 65–99)
POTASSIUM: 4.7 mmol/L (ref 3.5–5.1)
Sodium: 125 mmol/L — ABNORMAL LOW (ref 135–145)
TOTAL PROTEIN: 6.8 g/dL (ref 6.5–8.1)

## 2016-08-28 LAB — CBC WITH DIFFERENTIAL/PLATELET
Basophils Absolute: 0 10*3/uL (ref 0–0.1)
Basophils Relative: 0 %
EOS PCT: 0 %
Eosinophils Absolute: 0 10*3/uL (ref 0–0.7)
HEMATOCRIT: 29.2 % — AB (ref 35.0–47.0)
Hemoglobin: 9.8 g/dL — ABNORMAL LOW (ref 12.0–16.0)
LYMPHS ABS: 0.8 10*3/uL — AB (ref 1.0–3.6)
LYMPHS PCT: 14 %
MCH: 29.8 pg (ref 26.0–34.0)
MCHC: 33.6 g/dL (ref 32.0–36.0)
MCV: 88.6 fL (ref 80.0–100.0)
MONO ABS: 0.5 10*3/uL (ref 0.2–0.9)
MONOS PCT: 8 %
NEUTROS ABS: 4.5 10*3/uL (ref 1.4–6.5)
Neutrophils Relative %: 78 %
PLATELETS: 297 10*3/uL (ref 150–440)
RBC: 3.3 MIL/uL — ABNORMAL LOW (ref 3.80–5.20)
RDW: 13.2 % (ref 11.5–14.5)
WBC: 5.8 10*3/uL (ref 3.6–11.0)

## 2016-08-28 LAB — RETICULOCYTES
RBC.: 3.28 MIL/uL — AB (ref 3.80–5.20)
RETIC COUNT ABSOLUTE: 19.7 10*3/uL (ref 19.0–183.0)
Retic Ct Pct: 0.6 % (ref 0.4–3.1)

## 2016-08-28 LAB — SEDIMENTATION RATE: SED RATE: 16 mm/h (ref 0–30)

## 2016-08-28 LAB — C-REACTIVE PROTEIN

## 2016-08-28 LAB — FOLATE: Folate: 22 ng/mL (ref >5.9)

## 2016-08-28 LAB — FERRITIN: FERRITIN: 131 ng/mL (ref 11–307)

## 2016-08-28 NOTE — Addendum Note (Signed)
Addended by: Corene Cornea on: 08/28/2016 12:40 PM   Modules accepted: Orders

## 2016-08-28 NOTE — Progress Notes (Signed)
Hematology/Oncology Consult note [facility] Telephone:([phone] Fax:([phone]  Patient Care Team: Lorelee Market, MD as PCP - General (Family Medicine)   Name of the patient: Emma Fletcher  [id number]  09-06-1955    Reason for referral- anemia   Referring physician- Dr. Brunetta Genera  Date of visit: 08/28/16   History of presenting illness- patient is a 61 year old female with a past medical history significant for CKD, COPD, sleep apnea, type 2 diabetes obesity hypertension, and hypothyroidism amongst other medical problems. CMP from 06/21/2016 showed mildly elevated creatinine of 1.19. CBC showed white count of 8.5, H&H of 9.8/29.8 with an MCV of 87.9 and platelet count of 367. TSH was normal at 1.13. Free T3 and T4 were also within normal limits. She was seen by previous hematologist Dr. Bynum Bellows from [place] and was receiving procrit shots from his office every 2 months. Her last shot was in December 2017. Dr. Murvin Natal left that practice and she has not received any EPO shots since then.   ECOG PS- 2  Pain scale- 0   Review of systems- Review of Systems  Constitutional: Positive for malaise/fatigue. Negative for chills, fever and weight loss.  HENT: Negative for congestion, ear discharge and nosebleeds.   Eyes: Negative for blurred vision.  Respiratory: Negative for cough, hemoptysis, sputum production, shortness of breath and wheezing.   Cardiovascular: Negative for chest pain, palpitations, orthopnea and claudication.  Gastrointestinal: Negative for abdominal pain, blood in stool, constipation, diarrhea, heartburn, melena, nausea and vomiting.  Genitourinary: Negative for dysuria, flank pain, frequency, hematuria and urgency.  Musculoskeletal: Negative for back pain, joint pain and myalgias.  Skin: Negative for rash.  Neurological: Negative for dizziness, tingling, focal weakness, seizures, weakness and headaches.  Endo/Heme/Allergies: Does not  bruise/bleed easily.  Psychiatric/Behavioral: Negative for depression and suicidal ideas. The patient does not have insomnia.     No Known Allergies  Patient Active Problem List   Diagnosis Date Noted  . CKD (chronic kidney disease), stage II 05/16/2016  . Hypoglycemia associated with diabetes ([place]) 12/18/2015  . Hyponatremia 12/18/2015  . Acute renal insufficiency 12/18/2015  . Anemia 12/18/2015  . Acute pyelonephritis 12/18/2015  . Hypoglycemia 12/18/2015  . HYPERTENSION, UNSPECIFIED 11/10/2008  . CARDIOMYOPATHY, SECONDARY 11/10/2008  . EDEMA 11/10/2008     Past Medical History:  Diagnosis Date  . Anemia   . Cardiomyopathy secondary   . Developmental delay   . Diabetes mellitus without complication ([place])   . Edema   . Hypercholesterolemia   . Unspecified essential hypertension      Past Surgical History:  Procedure Laterality Date  . none      Social History   Social History  . Marital status: Single    Spouse name: N/A  . Number of children: N/A  . Years of education: N/A   Occupational History  . Full time works in [place]  . Smoking status: Never Smoker  . Smokeless tobacco: Never Used  . Alcohol use No  . Drug use: No  . Sexual activity: Not on file   Other Topics Concern  . Not on file   Social History Narrative   Single   Gets regular exercise     Family History  Problem Relation Age of Onset  . Coronary artery disease Other   . Stroke Other     CVA  . Diabetes Mother   . Diabetes Father      Current Outpatient Prescriptions:  .  alendronate (FOSAMAX) 70 MG tablet, Take 1 tablet by mouth once a week., Disp: , Rfl:  .  alum & mag hydroxide-simeth (MAALOX/MYLANTA) 001-749-44 MG/5ML suspension, Take 15 mLs by mouth every 4 (four) hours as needed for indigestion or heartburn., Disp: 355 mL, Rfl: 0 .  budesonide-formoterol (SYMBICORT) 160-4.5 MCG/ACT inhaler, Inhale 2 puffs into the lungs 2 (two) times  daily., Disp: , Rfl:  .  Calcium Carb-Cholecalciferol (CALCIUM 600+D) 600-800 MG-UNIT TABS, Take 1 tablet by mouth daily., Disp: , Rfl:  .  calcium-vitamin D (OSCAL WITH D) 500-200 MG-UNIT tablet, Take 1 tablet by mouth daily., Disp: 30 tablet, Rfl: 5 .  Choline Fenofibrate (FENOFIBRIC ACID) 135 MG CPDR, Take 135 mg by mouth daily. , Disp: , Rfl:  .  furosemide (LASIX) 20 MG tablet, Take 20 mg by mouth daily.  , Disp: , Rfl:  .  Iron, Ferrous Sulfate, 142 (45 Fe) MG TBCR, Take 90 mg of iron by mouth daily., Disp: , Rfl:  .  lansoprazole (PREVACID) 30 MG capsule, Take 30 mg by mouth daily. , Disp: , Rfl:  .  levothyroxine (SYNTHROID, LEVOTHROID) 100 MCG tablet, Take 100 mcg by mouth daily.  , Disp: , Rfl:  .  linagliptin (TRADJENTA) 5 MG TABS tablet, Take 0.5 tablets (2.5 mg total) by mouth daily., Disp: 30 tablet, Rfl: 6 .  lisinopril (PRINIVIL,ZESTRIL) 20 MG tablet, Take 20 mg by mouth daily.  , Disp: , Rfl:  .  Multiple Vitamins-Minerals (ONE-A-DAY WOMENS 50 PLUS PO), Take 1 tablet by mouth daily., Disp: , Rfl:  .  potassium chloride (KLOR-CON) 10 MEQ CR tablet, Take 10 mEq by mouth daily.  , Disp: , Rfl:  .  SPIRIVA HANDIHALER 18 MCG inhalation capsule, Place 18 mcg into inhaler and inhale daily. , Disp: , Rfl:  .  VENTOLIN HFA 108 (90 Base) MCG/ACT inhaler, Take 2 puffs by mouth every 4 (four) hours as needed., Disp: , Rfl:  .  vitamin C (ASCORBIC ACID) 500 MG tablet, Take 500 mg by mouth daily., Disp: , Rfl:    Physical exam:  Vitals:   08/28/16 1031  BP: 99/64  Pulse: 75  Resp: 18  Temp: 97.4 F (36.3 C)  TempSrc: Tympanic  Weight: 212 lb 11.9 oz (96.5 kg)  Height: '5\' 5"'  (1.651 m)   Physical Exam  Constitutional: She is oriented to person, place, and time and well-developed, well-nourished, and in no distress.  HENT:  Head: Normocephalic and atraumatic.  Eyes: EOM are normal. Pupils are equal, round, and reactive to light.  Neck: Normal range of motion.  Cardiovascular:  Normal rate, regular rhythm and normal heart sounds.   Pulmonary/Chest: Effort normal and breath sounds normal.  Abdominal: Soft. Bowel sounds are normal.  Musculoskeletal: She exhibits edema (trace).  Neurological: She is alert and oriented to person, place, and time.  Skin: Skin is warm and dry.       CMP Latest Ref Rng & Units 05/15/2016  Glucose 65 - 99 mg/dL 136(H)  BUN 6 - 20 mg/dL 23(H)  Creatinine 0.44 - 1.00 mg/dL 1.06(H)  Sodium 135 - 145 mmol/L 134(L)  Potassium 3.5 - 5.1 mmol/L 4.5  Chloride 101 - 111 mmol/L 103  CO2 22 - 32 mmol/L 25  Calcium 8.9 - 10.3 mg/dL 9.5  Total Protein 6.5 - 8.1 g/dL -  Total Bilirubin 0.3 - 1.2 mg/dL -  Alkaline Phos 38 - 126 U/L -  AST 15 - 41 U/L -  ALT 14 - 54 U/L -  CBC Latest Ref Rng & Units 05/15/2016  WBC 3.6 - 11.0 K/uL 8.0  Hemoglobin 12.0 - 16.0 g/dL 11.0(L)  Hematocrit 35.0 - 47.0 % 33.0(L)  Platelets 150 - 440 K/uL 330     Assessment and plan- Patient is a 62 y.o. female who has been referred to [place] for evaluation and management of normocytic anemia likely from anemia of CKD  I will do a complete anemia workup including CBC with differential, CMP, ferritin and iron studies, B12 and folate, reticulocyte count and haptoglobin. I will check ESR, CRP and myeloma panel. We will obtain records from Dr. Clovis Pu office and schedule her procrit shots in the next 2 weeks if her hemoglobin is <10. I will see back in 3 months with repeat cbc, bmp. I explained to the patient that procrit shots are given only when hb is <10 to maintain between 101-11. If Hb increases beyond 11 with procrit, it may be associated with increased risks of trokes and cardiovascular events. Patient understands and agrees to proceed.   Thank you for this kind referral and the opportunity to participate in the care of this patient   Visit Diagnosis 1. Anemia of chronic renal failure, unspecified CKD stage   2. normocytic anemia     Dr. Randa Evens, MD,  [facility] [place] at [facility] Pager- [phone] 08/28/2016  10:55 AM

## 2016-08-29 LAB — HAPTOGLOBIN: Haptoglobin: 171 mg/dL (ref 34–200)

## 2016-08-31 LAB — MULTIPLE MYELOMA PANEL, SERUM
ALBUMIN SERPL ELPH-MCNC: 3.5 g/dL (ref 2.9–4.4)
ALBUMIN/GLOB SERPL: 1.2 (ref 0.7–1.7)
Alpha 1: 0.3 g/dL (ref 0.0–0.4)
Alpha2 Glob SerPl Elph-Mcnc: 0.7 g/dL (ref 0.4–1.0)
B-GLOBULIN SERPL ELPH-MCNC: 1 g/dL (ref 0.7–1.3)
GAMMA GLOB SERPL ELPH-MCNC: 1.1 g/dL (ref 0.4–1.8)
GLOBULIN, TOTAL: 3 g/dL (ref 2.2–3.9)
IgA: 182 mg/dL (ref 87–352)
IgG (Immunoglobin G), Serum: 987 mg/dL (ref 700–1600)
IgM, Serum: 123 mg/dL (ref 26–217)
Total Protein ELP: 6.5 g/dL (ref 6.0–8.5)

## 2016-09-04 ENCOUNTER — Encounter: Payer: Self-pay | Admitting: Family Medicine

## 2016-09-07 ENCOUNTER — Telehealth: Payer: Self-pay | Admitting: *Deleted

## 2016-09-07 NOTE — Telephone Encounter (Signed)
Asking to be called with results of labs [id number]

## 2016-09-07 NOTE — Telephone Encounter (Signed)
Called x 3 today and the phone is busy. Will try tom.

## 2016-09-08 NOTE — Telephone Encounter (Signed)
I called the pt and told her that I tried calling yest. And phone was busy.  I called again today and the pt. Told me to call her sister and I called June and she understands what hgb is and that I have finally got in touch with a person from [place] office and they promise me to send notes.  I will call her on tues when Dr. Smith Robert gets home.

## 2016-09-12 ENCOUNTER — Ambulatory Visit: Payer: Medicare Other | Admitting: Oncology

## 2016-09-15 ENCOUNTER — Telehealth: Payer: Self-pay | Admitting: *Deleted

## 2016-09-15 NOTE — Telephone Encounter (Signed)
Please notify pt that Dr Smith Robert does not want to start procrit at this time she wants to have her come in for a lab visit sometime in April for a cbc and will decide then if she wants to give her procrit,  After you let her know order will need to be put in and notify scheduling to call her with lab appt

## 2016-09-18 ENCOUNTER — Other Ambulatory Visit: Payer: Self-pay | Admitting: *Deleted

## 2016-09-18 ENCOUNTER — Telehealth: Payer: Self-pay | Admitting: *Deleted

## 2016-09-18 DIAGNOSIS — D509 Iron deficiency anemia, unspecified: Secondary | ICD-10-CM

## 2016-09-18 NOTE — Telephone Encounter (Signed)
Called patient and informed her that she did not need procrit at this time and that the labs would be rechecked in April, voiced understanding.

## 2016-09-25 ENCOUNTER — Ambulatory Visit (INDEPENDENT_AMBULATORY_CARE_PROVIDER_SITE_OTHER): Payer: Medicare Other | Admitting: Podiatry

## 2016-09-25 DIAGNOSIS — L03032 Cellulitis of left toe: Secondary | ICD-10-CM

## 2016-09-25 DIAGNOSIS — L600 Ingrowing nail: Secondary | ICD-10-CM | POA: Diagnosis not present

## 2016-09-25 NOTE — Progress Notes (Signed)
This patient presents the office with chief complaint of a painful, red ingrowing toenail big toe left foot. Patient presents the office for her regular preventative foot care services, but says that she injured the big toenail of the left foot and it has become red and swollen and painful for the last 2-3 weeks. She has provided no self treatment or sought any professional help   Complaint:  Visit Type: Patient returns to my office for continued preventative foot care services. Complaint: Patient states" my nails have grown long and thick and become painful to walk and wear shoes" Patient has been diagnosed with DM with no foot complications. The patient presents for preventative foot care services. No changes to ROS  Podiatric Exam: Vascular: dorsalis pedis and posterior tibial pulses are palpable bilateral. Capillary return is immediate. Temperature gradient is WNL. Skin turgor WNL  Sensorium: Normal Semmes Weinstein monofilament test. Normal tactile sensation bilaterally. Nail Exam: Marked incurvation with redness and swelling noted lateral border left foot. Ulcer Exam: There is no evidence of ulcer or pre-ulcerative changes or infection. Orthopedic Exam: Muscle tone and strength are WNL. No limitations in general ROM. No crepitus or effusions noted. Foot type and digits show no abnormalities. Bony prominences are unremarkable. Skin: No Porokeratosis. No infection or ulcers  Diagnosis:  Onychomycosis, , Pain in right toe, pain in left toes  Treatment & Plan Procedures and Treatment: Consent by patient was obtained for treatment procedures. The patient understood the discussion of treatment and procedures well. All questions were answered thoroughly reviewed. Nail surgery.  Treatment options and alternatives discussed.  Recommended permanent phenol matrixectomy and patient agreed.  Left hallux was prepped with alcohol and a toe block of 3cc of 2% lidocaine plain was administered in a digital toe  block. .  The toe was then prepped with betadine solution .  The offending nail border was then excised and matrix tissue exposed.  Phenol was then applied to the matrix tissue followed by an alcohol wash.  Antibiotic ointment and a dry sterile dressing was applied.  The patient was dispensed instructions for aftercare. RTC 1 week.     Helane Gunther DPM     Helane Gunther DPM  Return Visit-Office Procedure: Patient instructed to return to the office for a follow up visit 3 months for continued evaluation and treatment.    Helane Gunther DPM

## 2016-09-25 NOTE — Patient Instructions (Signed)

## 2016-09-26 DIAGNOSIS — I10 Essential (primary) hypertension: Secondary | ICD-10-CM | POA: Diagnosis not present

## 2016-09-26 DIAGNOSIS — E039 Hypothyroidism, unspecified: Secondary | ICD-10-CM | POA: Diagnosis not present

## 2016-09-26 DIAGNOSIS — D649 Anemia, unspecified: Secondary | ICD-10-CM | POA: Diagnosis not present

## 2016-09-26 DIAGNOSIS — R5383 Other fatigue: Secondary | ICD-10-CM | POA: Diagnosis not present

## 2016-09-26 DIAGNOSIS — E119 Type 2 diabetes mellitus without complications: Secondary | ICD-10-CM | POA: Diagnosis not present

## 2016-09-26 DIAGNOSIS — K219 Gastro-esophageal reflux disease without esophagitis: Secondary | ICD-10-CM | POA: Diagnosis not present

## 2016-09-26 DIAGNOSIS — E7800 Pure hypercholesterolemia, unspecified: Secondary | ICD-10-CM | POA: Diagnosis not present

## 2016-09-26 DIAGNOSIS — E784 Other hyperlipidemia: Secondary | ICD-10-CM | POA: Diagnosis not present

## 2016-09-26 DIAGNOSIS — E559 Vitamin D deficiency, unspecified: Secondary | ICD-10-CM | POA: Diagnosis not present

## 2016-10-02 ENCOUNTER — Ambulatory Visit (INDEPENDENT_AMBULATORY_CARE_PROVIDER_SITE_OTHER): Payer: Medicare Other | Admitting: Podiatry

## 2016-10-02 ENCOUNTER — Encounter: Payer: Self-pay | Admitting: Podiatry

## 2016-10-02 DIAGNOSIS — Z09 Encounter for follow-up examination after completed treatment for conditions other than malignant neoplasm: Secondary | ICD-10-CM

## 2016-10-02 NOTE — Progress Notes (Signed)
This patient returns to the office following nail surgery one week ago.  The patient says toe has been soaked and bandaged as directed.  There has been improvement of the toe since the surgery has been performed. She does appear with pain at site of base surgical site.  The patient presents for continued evaluation and treatment.  GENERAL APPEARANCE: Alert, conversant. Appropriately groomed. No acute distress.  VASCULAR: Pedal pulses palpable at  [facility] and PT bilateral.  Capillary refill time is immediate to all digits,  Normal temperature gradient.    NEUROLOGIC: sensation is normal to 5.07 monofilament at 5/5 sites bilateral.  Light touch is intact bilateral, Muscle strength normal.  MUSCULOSKELETAL: acceptable muscle strength, tone and stability bilateral.  Intrinsic muscluature intact bilateral.  Rectus appearance of foot and digits noted bilateral.   DERMATOLOGIC: skin color, texture, and turgor are within normal limits.  No preulcerative lesions or ulcers  are seen, no interdigital maceration noted.   NAILS  There is necrotic tissue along the nail groove.  Redness noted proximal nail fold probably due to phenol.  DX  S/p nail surgery  ROV  Home instructions were discussed.  Patient to call the office if there are any questions or concerns.   Helane Gunther DPM

## 2016-10-04 ENCOUNTER — Inpatient Hospital Stay: Payer: Medicare Other | Attending: Oncology

## 2016-10-04 DIAGNOSIS — D631 Anemia in chronic kidney disease: Secondary | ICD-10-CM | POA: Insufficient documentation

## 2016-10-04 DIAGNOSIS — I129 Hypertensive chronic kidney disease with stage 1 through stage 4 chronic kidney disease, or unspecified chronic kidney disease: Secondary | ICD-10-CM | POA: Diagnosis not present

## 2016-10-04 DIAGNOSIS — N189 Chronic kidney disease, unspecified: Secondary | ICD-10-CM | POA: Insufficient documentation

## 2016-10-04 DIAGNOSIS — D509 Iron deficiency anemia, unspecified: Secondary | ICD-10-CM

## 2016-10-04 LAB — CBC
HEMATOCRIT: 28.5 % — AB (ref 35.0–47.0)
HEMOGLOBIN: 9.8 g/dL — AB (ref 12.0–16.0)
MCH: 30.3 pg (ref 26.0–34.0)
MCHC: 34.5 g/dL (ref 32.0–36.0)
MCV: 87.8 fL (ref 80.0–100.0)
PLATELETS: 306 10*3/uL (ref 150–440)
RBC: 3.25 MIL/uL — AB (ref 3.80–5.20)
RDW: 13 % (ref 11.5–14.5)
WBC: 5.2 10*3/uL (ref 3.6–11.0)

## 2016-10-05 NOTE — Progress Notes (Signed)
PLease let patient know that her hb is 9.8 similar to 1 month ago. We could potentially wait until next appt in June to decide if she needs epo as it is very close to 10

## 2016-10-10 ENCOUNTER — Telehealth: Payer: Self-pay | Admitting: *Deleted

## 2016-10-10 NOTE — Telephone Encounter (Signed)
-----   Message from Creig Hines, MD sent at 10/05/2016  8:04 AM EDT ----- PLease let patient know that her hb is 9.8 similar to 1 month ago. We could potentially wait until next appt in June to decide if she needs epo as it is very close to 10

## 2016-10-10 NOTE — Telephone Encounter (Signed)
Called pt to let her know that hgb was 9.8 and unless she is feeling more tired than usual we will just hold off until June to see if she will need a shot.  She states she feels good and can wait til June.

## 2016-10-23 ENCOUNTER — Ambulatory Visit: Payer: Medicare Other | Admitting: Podiatry

## 2016-10-25 DIAGNOSIS — L309 Dermatitis, unspecified: Secondary | ICD-10-CM | POA: Diagnosis not present

## 2016-10-25 DIAGNOSIS — D649 Anemia, unspecified: Secondary | ICD-10-CM | POA: Diagnosis not present

## 2016-10-25 DIAGNOSIS — E119 Type 2 diabetes mellitus without complications: Secondary | ICD-10-CM | POA: Diagnosis not present

## 2016-10-25 DIAGNOSIS — I10 Essential (primary) hypertension: Secondary | ICD-10-CM | POA: Diagnosis not present

## 2016-11-17 ENCOUNTER — Telehealth: Payer: Self-pay | Admitting: *Deleted

## 2016-11-17 NOTE — Telephone Encounter (Signed)
-----   Message from Ranelle Oyster Scruggs sent at 11/17/2016  4:12 PM EDT ----- Contact: [phone] Her sister June called and would like lab results from her 4-18 visit please-she asked for you...thx

## 2016-11-17 NOTE — Telephone Encounter (Signed)
Called and got voicemail with June and told her the hgb 9.8 and it was the same the month before that and I had called 4/24 to let them know to stay on same course with hgb 9.8 and unless pt really more fatigue we will wait til June.  If she has questions she can call me back.

## 2016-11-21 ENCOUNTER — Inpatient Hospital Stay: Payer: Medicare Other

## 2016-11-21 ENCOUNTER — Inpatient Hospital Stay: Payer: Medicare Other | Admitting: Oncology

## 2016-11-23 ENCOUNTER — Inpatient Hospital Stay: Payer: Medicare Other | Attending: Oncology

## 2016-11-23 ENCOUNTER — Inpatient Hospital Stay (HOSPITAL_BASED_OUTPATIENT_CLINIC_OR_DEPARTMENT_OTHER): Payer: Medicare Other | Admitting: Oncology

## 2016-11-23 ENCOUNTER — Encounter: Payer: Self-pay | Admitting: Oncology

## 2016-11-23 VITALS — BP 123/78 | HR 67 | Temp 97.7°F | Resp 18 | Wt 218.2 lb

## 2016-11-23 DIAGNOSIS — E039 Hypothyroidism, unspecified: Secondary | ICD-10-CM

## 2016-11-23 DIAGNOSIS — E871 Hypo-osmolality and hyponatremia: Secondary | ICD-10-CM | POA: Insufficient documentation

## 2016-11-23 DIAGNOSIS — Z79899 Other long term (current) drug therapy: Secondary | ICD-10-CM | POA: Diagnosis not present

## 2016-11-23 DIAGNOSIS — N189 Chronic kidney disease, unspecified: Secondary | ICD-10-CM | POA: Insufficient documentation

## 2016-11-23 DIAGNOSIS — E669 Obesity, unspecified: Secondary | ICD-10-CM

## 2016-11-23 DIAGNOSIS — J449 Chronic obstructive pulmonary disease, unspecified: Secondary | ICD-10-CM | POA: Diagnosis not present

## 2016-11-23 DIAGNOSIS — I129 Hypertensive chronic kidney disease with stage 1 through stage 4 chronic kidney disease, or unspecified chronic kidney disease: Secondary | ICD-10-CM | POA: Insufficient documentation

## 2016-11-23 DIAGNOSIS — Z7984 Long term (current) use of oral hypoglycemic drugs: Secondary | ICD-10-CM | POA: Insufficient documentation

## 2016-11-23 DIAGNOSIS — D509 Iron deficiency anemia, unspecified: Secondary | ICD-10-CM

## 2016-11-23 DIAGNOSIS — D638 Anemia in other chronic diseases classified elsewhere: Secondary | ICD-10-CM

## 2016-11-23 DIAGNOSIS — E7800 Pure hypercholesterolemia, unspecified: Secondary | ICD-10-CM

## 2016-11-23 DIAGNOSIS — R5382 Chronic fatigue, unspecified: Secondary | ICD-10-CM

## 2016-11-23 DIAGNOSIS — G4730 Sleep apnea, unspecified: Secondary | ICD-10-CM | POA: Diagnosis not present

## 2016-11-23 DIAGNOSIS — D631 Anemia in chronic kidney disease: Secondary | ICD-10-CM | POA: Diagnosis not present

## 2016-11-23 DIAGNOSIS — I429 Cardiomyopathy, unspecified: Secondary | ICD-10-CM | POA: Insufficient documentation

## 2016-11-23 DIAGNOSIS — E119 Type 2 diabetes mellitus without complications: Secondary | ICD-10-CM | POA: Insufficient documentation

## 2016-11-23 LAB — CBC
HCT: 26.9 % — ABNORMAL LOW (ref 35.0–47.0)
HEMOGLOBIN: 9.5 g/dL — AB (ref 12.0–16.0)
MCH: 30.5 pg (ref 26.0–34.0)
MCHC: 35.1 g/dL (ref 32.0–36.0)
MCV: 86.7 fL (ref 80.0–100.0)
Platelets: 303 10*3/uL (ref 150–440)
RBC: 3.11 MIL/uL — ABNORMAL LOW (ref 3.80–5.20)
RDW: 12.7 % (ref 11.5–14.5)
WBC: 6.4 10*3/uL (ref 3.6–11.0)

## 2016-11-23 LAB — COMPREHENSIVE METABOLIC PANEL
ALBUMIN: 3.9 g/dL (ref 3.5–5.0)
ALK PHOS: 61 U/L (ref 38–126)
ALT: 17 U/L (ref 14–54)
ANION GAP: 7 (ref 5–15)
AST: 21 U/L (ref 15–41)
BILIRUBIN TOTAL: 0.4 mg/dL (ref 0.3–1.2)
BUN: 22 mg/dL — ABNORMAL HIGH (ref 6–20)
CALCIUM: 9.6 mg/dL (ref 8.9–10.3)
CO2: 26 mmol/L (ref 22–32)
Chloride: 96 mmol/L — ABNORMAL LOW (ref 101–111)
Creatinine, Ser: 1.05 mg/dL — ABNORMAL HIGH (ref 0.44–1.00)
GFR calc Af Amer: >60 mL/min (ref >60)
GFR calc non Af Amer: 57 mL/min — ABNORMAL LOW (ref >60)
GLUCOSE: 93 mg/dL (ref 65–99)
Potassium: 4.4 mmol/L (ref 3.5–5.1)
Sodium: 129 mmol/L — ABNORMAL LOW (ref 135–145)
TOTAL PROTEIN: 7 g/dL (ref 6.5–8.1)

## 2016-11-23 NOTE — Progress Notes (Signed)
Hematology/Oncology Consult note [facility]  Telephone:([phone] Fax:([phone]  Patient Care Team: Lorelee Market, MD as PCP - General (Family Medicine)   Name of the patient: Emma Fletcher  [id number]  1955/11/05   Date of visit: 11/23/16  Diagnosis- anemia of chronic kidney disease  Chief complaint/ Reason for visit- routine f/u  Heme/Onc history: patient is a 61 year old female with a past medical history significant for CKD, COPD, sleep apnea, type 2 diabetes obesity hypertension, and hypothyroidism amongst other medical problems. CMP from 06/21/2016 showed mildly elevated creatinine of 1.19. CBC showed white count of 8.5, H&H of 9.8/29.8 with an MCV of 87.9 and platelet count of 367. TSH was normal at 1.13. Free T3 and T4 were also within normal limits. She was seen by previous hematologist Dr. Bynum Bellows from [place] and was receiving procrit shots from his office every 2 months. Her last shot was in December 2017. Dr. Murvin Natal left that practice and she has not received any EPO shots since then.   Further workup of the anemia from March 2018 was as follows: CBC showed white count of 5.8, H&H of 9.8/29.2 and a platelet count of 297. CMP was significant for chronic hyponatremia with a sodium of 125, BUN of 27 and creatinine of 1. Ferritin was normal and iron studies are within normal limits. B12 and folate were within normal limits. ESR and CRP was within normal limits. Reticulocyte count was 0.6% which was low for the degree of anemia. Multiple myeloma panel did not reveal any monoclonal protein. Haptoglobin was normal at 171.  Interval history- reports chronic fatigue. Denies other complaints   Review of systems- Review of Systems  Constitutional: Positive for malaise/fatigue. Negative for chills, fever and weight loss.  HENT: Negative for congestion, ear discharge and nosebleeds.   Eyes: Negative for blurred vision.  Respiratory: Negative for  cough, hemoptysis, sputum production, shortness of breath and wheezing.   Cardiovascular: Negative for chest pain, palpitations, orthopnea and claudication.  Gastrointestinal: Negative for abdominal pain, blood in stool, constipation, diarrhea, heartburn, melena, nausea and vomiting.  Genitourinary: Negative for dysuria, flank pain, frequency, hematuria and urgency.  Musculoskeletal: Negative for back pain, joint pain and myalgias.  Skin: Negative for rash.  Neurological: Negative for dizziness, tingling, focal weakness, seizures, weakness and headaches.  Endo/Heme/Allergies: Does not bruise/bleed easily.  Psychiatric/Behavioral: Negative for depression and suicidal ideas. The patient does not have insomnia.        No Known Allergies   Past Medical History:  Diagnosis Date  . Anemia   . Cardiomyopathy secondary   . Developmental delay   . Diabetes mellitus without complication ([place])   . Edema   . Hypercholesterolemia   . Unspecified essential hypertension      Past Surgical History:  Procedure Laterality Date  . none      Social History   Social History  . Marital status: Single    Spouse name: N/A  . Number of children: N/A  . Years of education: N/A   Occupational History  . Full time works in [place]  . Smoking status: Never Smoker  . Smokeless tobacco: Never Used  . Alcohol use No  . Drug use: No  . Sexual activity: Not on file   Other Topics Concern  . Not on file   Social History Narrative   Single   Gets regular exercise    Family History  Problem Relation Age of Onset  .  Diabetes Mother   . Coronary artery disease Mother   . Diabetes Father   . Stroke Father      Current Outpatient Prescriptions:  .  alum & mag hydroxide-simeth (MAALOX/MYLANTA) 212-248-25 MG/5ML suspension, Take 15 mLs by mouth every 4 (four) hours as needed for indigestion or heartburn., Disp: 355 mL, Rfl: 0 .  budesonide-formoterol  (SYMBICORT) 160-4.5 MCG/ACT inhaler, Inhale 2 puffs into the lungs 2 (two) times daily., Disp: , Rfl:  .  Calcium Carb-Cholecalciferol (CALCIUM 600+D) 600-800 MG-UNIT TABS, Take 1 tablet by mouth daily., Disp: , Rfl:  .  Choline Fenofibrate (FENOFIBRIC ACID) 135 MG CPDR, Take 135 mg by mouth daily. , Disp: , Rfl:  .  furosemide (LASIX) 20 MG tablet, Take 20 mg by mouth daily.  , Disp: , Rfl:  .  glimepiride (AMARYL) 2 MG tablet, Take 2 mg by mouth daily with breakfast. Only if sugar is over 150, Disp: , Rfl:  .  Iron, Ferrous Sulfate, 142 (45 Fe) MG TBCR, Take 90 mg of iron by mouth daily. 2 a day, Disp: , Rfl:  .  lansoprazole (PREVACID) 30 MG capsule, Take 30 mg by mouth daily. , Disp: , Rfl:  .  levothyroxine (SYNTHROID, LEVOTHROID) 100 MCG tablet, Take 100 mcg by mouth daily.  , Disp: , Rfl:  .  linagliptin (TRADJENTA) 5 MG TABS tablet, Take 0.5 tablets (2.5 mg total) by mouth daily., Disp: 30 tablet, Rfl: 6 .  lisinopril (PRINIVIL,ZESTRIL) 20 MG tablet, Take 20 mg by mouth daily.  , Disp: , Rfl:  .  Multiple Vitamins-Minerals (ONE-A-DAY WOMENS 50 PLUS PO), Take 1 tablet by mouth daily., Disp: , Rfl:  .  potassium chloride (KLOR-CON) 10 MEQ CR tablet, Take 10 mEq by mouth daily.  , Disp: , Rfl:  .  SPIRIVA HANDIHALER 18 MCG inhalation capsule, Place 18 mcg into inhaler and inhale daily. , Disp: , Rfl:  .  vitamin C (ASCORBIC ACID) 500 MG tablet, Take 500 mg by mouth daily., Disp: , Rfl:   Physical exam:  Vitals:   11/23/16 1013  BP: 123/78  Pulse: 67  Resp: 18  Temp: 97.7 F (36.5 C)  TempSrc: Tympanic  Weight: 218 lb 4 oz (99 kg)   Physical Exam  Constitutional: She is oriented to person, place, and time and well-developed, well-nourished, and in no distress.  HENT:  Head: Normocephalic and atraumatic.  Eyes: EOM are normal. Pupils are equal, round, and reactive to light.  Neck: Normal range of motion.  Cardiovascular: Normal rate, regular rhythm and normal heart sounds.     Pulmonary/Chest: Effort normal and breath sounds normal.  Abdominal: Soft. Bowel sounds are normal.  Neurological: She is alert and oriented to person, place, and time.  Skin: Skin is warm and dry.     CMP Latest Ref Rng & Units 11/23/2016  Glucose 65 - 99 mg/dL 93  BUN 6 - 20 mg/dL 22(H)  Creatinine 0.44 - 1.00 mg/dL 1.05(H)  Sodium 135 - 145 mmol/L 129(L)  Potassium 3.5 - 5.1 mmol/L 4.4  Chloride 101 - 111 mmol/L 96(L)  CO2 22 - 32 mmol/L 26  Calcium 8.9 - 10.3 mg/dL 9.6  Total Protein 6.5 - 8.1 g/dL 7.0  Total Bilirubin 0.3 - 1.2 mg/dL 0.4  Alkaline Phos 38 - 126 U/L 61  AST 15 - 41 U/L 21  ALT 14 - 54 U/L 17   CBC Latest Ref Rng & Units 11/23/2016  WBC 3.6 - 11.0 K/uL 6.4  Hemoglobin 12.0 -  16.0 g/dL 9.5(L)  Hematocrit 35.0 - 47.0 % 26.9(L)  Platelets 150 - 440 K/uL 303      Assessment and plan- Patient is a 61 y.o. female referred for anemia of chronic kidney disease  Patient's recent CMP over the last 3 months does not show any evidence of significant kidney disease. I did discuss the results of the anemia workup with the patient which was consistent with chronic disease. Given that she does not have any significant kidney disease or would like to hold off on Procrit shots at this time. Her hemoglobin has remained stable close to 9 over the last 3-4 months and in the absence of chronic kidney disease I do not think that Procrit would be indicated at this time. Patient states that her fatigue was improved while she was on Procrit shots in the past but during that time her hemoglobin was significantly low around 8. At this time I will repeat her CBC ferritin and iron studies B12 and CMP in 1 month and 2 months. I will see ehr back in 3 months with labs. If her hb starts trending down, I will consider doing bone marrow biopsy to ascertain if there is any other cause of her anemia   Visit Diagnosis 1. Anemia of chronic disease      Dr. Randa Evens, MD, MPH [facility] at [facility] Pager- [phone] 11/23/2016 12:04 PM

## 2016-11-23 NOTE — Progress Notes (Signed)
Patient continues to feel fatigued.  Otherwise, no complaints.

## 2016-11-30 ENCOUNTER — Other Ambulatory Visit: Payer: Medicare Other

## 2016-11-30 ENCOUNTER — Ambulatory Visit: Payer: Medicare Other | Admitting: Oncology

## 2016-12-04 ENCOUNTER — Ambulatory Visit (INDEPENDENT_AMBULATORY_CARE_PROVIDER_SITE_OTHER): Payer: Medicare Other | Admitting: Podiatry

## 2016-12-04 DIAGNOSIS — E119 Type 2 diabetes mellitus without complications: Secondary | ICD-10-CM

## 2016-12-04 DIAGNOSIS — M79676 Pain in unspecified toe(s): Secondary | ICD-10-CM | POA: Diagnosis not present

## 2016-12-04 DIAGNOSIS — B351 Tinea unguium: Secondary | ICD-10-CM

## 2016-12-04 MED ORDER — DOXYCYCLINE HYCLATE 100 MG PO TABS: 100.0000 mg | ORAL_TABLET | Freq: Two times a day (BID) | ORAL | 0 refills | Status: DC

## 2016-12-04 NOTE — Progress Notes (Signed)
Complaint:  Visit Type: Patient returns to my office for continued preventative foot care services. Complaint: Patient states" my nails have grown long and thick and become painful to walk and wear shoes" Patient has been diagnosed with DM with no foot complications. The patient presents for preventative foot care services. No changes to ROS.  She gives a vague history of insect bite right foot.  She is concerned about the bite.  Podiatric Exam: Vascular: dorsalis pedis and posterior tibial pulses are palpable bilateral. Capillary return is immediate. Temperature gradient is WNL. Skin turgor WNL  Sensorium: Normal Semmes Weinstein monofilament test. Normal tactile sensation bilaterally. Nail Exam: Pt has thick disfigured discolored nails with subungual debris noted bilateral entire nail hallux through fifth toenails Ulcer Exam: There is no evidence of ulcer or pre-ulcerative changes or infection. Orthopedic Exam: Muscle tone and strength are WNL. No limitations in general ROM. No crepitus or effusions noted. Foot type and digits show no abnormalities. Bony prominences are unremarkable. Skin: No Porokeratosis. No infection or ulcers  Diagnosis:  Onychomycosis, , Pain in right toe, pain in left toes  Treatment & Plan Procedures and Treatment: Consent by patient was obtained for treatment procedures. The patient understood the discussion of treatment and procedures well. All questions were answered thoroughly reviewed. Debridement of mycotic and hypertrophic toenails, 1 through 5 bilateral and clearing of subungual debris. No ulceration, no infection noted. Prescribed doxycycline to treat insect bite in case it is a tick bite. Return Visit-Office Procedure: Patient instructed to return to the office for a follow up visit 3 months for continued evaluation and treatment.    Helane Gunther DPM

## 2016-12-04 NOTE — Addendum Note (Signed)
Addended by: Geraldine Contras D on: 12/04/2016 04:45 PM   Modules accepted: Orders

## 2016-12-12 ENCOUNTER — Emergency Department
Admission: EM | Admit: 2016-12-12 | Discharge: 2016-12-12 | Disposition: A | Discharge status: Home / Self Care | Payer: Medicare Other | Attending: Student in an Organized Health Care Education/Training Program | Admitting: Student in an Organized Health Care Education/Training Program

## 2016-12-12 ENCOUNTER — Encounter: Payer: Self-pay | Admitting: Emergency Medicine

## 2016-12-12 DIAGNOSIS — E162 Hypoglycemia, unspecified: Secondary | ICD-10-CM

## 2016-12-12 DIAGNOSIS — Z7984 Long term (current) use of oral hypoglycemic drugs: Secondary | ICD-10-CM | POA: Insufficient documentation

## 2016-12-12 DIAGNOSIS — E11649 Type 2 diabetes mellitus with hypoglycemia without coma: Secondary | ICD-10-CM | POA: Insufficient documentation

## 2016-12-12 DIAGNOSIS — I10 Essential (primary) hypertension: Secondary | ICD-10-CM | POA: Insufficient documentation

## 2016-12-12 DIAGNOSIS — E161 Other hypoglycemia: Secondary | ICD-10-CM | POA: Diagnosis not present

## 2016-12-12 LAB — URINALYSIS, COMPLETE (UACMP) WITH MICROSCOPIC
Bacteria, UA: NONE SEEN
Bilirubin Urine: NEGATIVE
Glucose, UA: 50 mg/dL — AB
Hgb urine dipstick: NEGATIVE
Ketones, ur: NEGATIVE mg/dL
Leukocytes, UA: NEGATIVE
Nitrite: NEGATIVE
Protein, ur: NEGATIVE mg/dL
SPECIFIC GRAVITY, URINE: 1.006 (ref 1.005–1.030)
pH: 7 (ref 5.0–8.0)

## 2016-12-12 LAB — GLUCOSE, CAPILLARY
GLUCOSE-CAPILLARY: 67 mg/dL (ref 65–99)
GLUCOSE-CAPILLARY: 145 mg/dL — AB (ref 65–99)
GLUCOSE-CAPILLARY: 144 mg/dL — AB (ref 65–99)
GLUCOSE-CAPILLARY: 132 mg/dL — AB (ref 65–99)
GLUCOSE-CAPILLARY: 116 mg/dL — AB (ref 65–99)
Glucose-Capillary: 61 mg/dL — ABNORMAL LOW (ref 65–99)
Glucose-Capillary: 166 mg/dL — ABNORMAL HIGH (ref 65–99)
Glucose-Capillary: 108 mg/dL — ABNORMAL HIGH (ref 65–99)

## 2016-12-12 LAB — BASIC METABOLIC PANEL
Anion gap: 9 (ref 5–15)
BUN: 24 mg/dL — AB (ref 6–20)
CALCIUM: 9.6 mg/dL (ref 8.9–10.3)
CO2: 25 mmol/L (ref 22–32)
CREATININE: 1.25 mg/dL — AB (ref 0.44–1.00)
Chloride: 99 mmol/L — ABNORMAL LOW (ref 101–111)
GFR calc Af Amer: 53 mL/min — ABNORMAL LOW (ref >60)
GFR, EST NON AFRICAN AMERICAN: 46 mL/min — AB (ref >60)
Glucose, Bld: 65 mg/dL (ref 65–99)
Potassium: 4.5 mmol/L (ref 3.5–5.1)
SODIUM: 133 mmol/L — AB (ref 135–145)

## 2016-12-12 LAB — CBC
HCT: 31.8 % — ABNORMAL LOW (ref 35.0–47.0)
HEMOGLOBIN: 10.6 g/dL — AB (ref 12.0–16.0)
MCH: 29.5 pg (ref 26.0–34.0)
MCHC: 33.4 g/dL (ref 32.0–36.0)
MCV: 88.4 fL (ref 80.0–100.0)
PLATELETS: 347 10*3/uL (ref 150–440)
RBC: 3.6 MIL/uL — ABNORMAL LOW (ref 3.80–5.20)
RDW: 12.8 % (ref 11.5–14.5)
WBC: 8.3 10*3/uL (ref 3.6–11.0)

## 2016-12-12 LAB — TROPONIN I

## 2016-12-12 MED ORDER — DEXTROSE 50 % IV SOLN
25.0000 g | Freq: Once | INTRAVENOUS | Status: AC
  Administered 2016-12-12: 25 g via INTRAVENOUS

## 2016-12-12 NOTE — ED Notes (Signed)
Pt was given 8oz orange juice, recheck CBG 61. MD at bedside at this MD aware, per MD give D50 and another 4oz orange juice.

## 2016-12-12 NOTE — ED Notes (Signed)
Pt given 2 cups of orange juice 

## 2016-12-12 NOTE — ED Notes (Signed)
Pt unhooked to go to the bathroom, instructed to hit call bell when she returns to bed. Will continue to monitor for further patient needs.

## 2016-12-12 NOTE — ED Notes (Signed)
Report handoff given to [facility]

## 2016-12-12 NOTE — ED Notes (Signed)
JUNE (SISTER) CELL [phone]

## 2016-12-12 NOTE — ED Notes (Signed)
Pt given lunch tray at this time. Pt's brother at bedside at this time. Pt remains alert and oriented. NAD noted at this time.

## 2016-12-12 NOTE — ED Notes (Signed)
Pt up to go to the bathroom at this time. Will return to bed and recheck CBG upon return to bed.

## 2016-12-12 NOTE — ED Notes (Signed)
Attempted to collected UA sample, unable to collect due to patient missing hat, patient noted to have tremors to R hand while sipping on other 4oz OJ given by this RN. Pt remains alert at this time. No incident while patient up to the bathroom, rechecking patient's CBG at this time.

## 2016-12-12 NOTE — ED Notes (Signed)
Pt up to go to the bathroom without incident. Will continue to monitor for further patient needs. Pt's sister states she needs to go to work. States she is able to come back and get patient at any time or will come back about around 1130.

## 2016-12-12 NOTE — ED Triage Notes (Addendum)
Patient's coming from home EMS. Patient's CBG was 32 and given 15g oral glucose and 2 OJ. And rechecked and was 36. Was given 25gm D50 and then was 162. Rechecked before arrival and was 104. Her medication was recently changed from glipized to metformin. Pt has hx of uti's and states she had burning and painful urination 2 weeks ago.

## 2016-12-12 NOTE — ED Notes (Signed)
Per MD, recheck CBG q67mins.

## 2016-12-12 NOTE — ED Notes (Signed)
Pt's CBG 166.

## 2016-12-12 NOTE — ED Provider Notes (Signed)
[facility] Emergency Department Provider Note    First MD Initiated Contact with Patient 12/12/16 [phone]     (approximate)  I have reviewed the triage vital signs and the nursing notes.   HISTORY  Chief Complaint Hypoglycemia    HPI Emma Fletcher is a 61 y.o. female history diabetes, developmental delay and recurrent UTIs presents with chief complaint of sweating and shakiness that awoke her from sleep early this morning. They initially checked her blood sugar when she woke up and it was 50. They called the ambulance and by time EMS had arrived despite having more diffuse at home she has sugars down into the 30s. Did not lose consciousness. Patient lives at home with her mother. Has had recent medication changes to Tradjenta and metformin.  Denies any chest pain. No shortness of breath. No abdominal pain. No nausea. Has had increased urinary frequency but no dysuria. States that she did have some dysuria roughly a week and a half to 2 weeks ago.   Past Medical History:  Diagnosis Date  . Anemia   . Cardiomyopathy secondary   . Developmental delay   . Diabetes mellitus without complication (HCC)   . Edema   . Hypercholesterolemia   . Unspecified essential hypertension    Family History  Problem Relation Age of Onset  . Diabetes Mother   . Coronary artery disease Mother   . Diabetes Father   . Stroke Father    Past Surgical History:  Procedure Laterality Date  . none     Patient Active Problem List   Diagnosis Date Noted  . CKD (chronic kidney disease), stage II 05/16/2016  . Hypoglycemia associated with diabetes (HCC) 12/18/2015  . Hyponatremia 12/18/2015  . Acute renal insufficiency 12/18/2015  . Anemia 12/18/2015  . Acute pyelonephritis 12/18/2015  . Hypoglycemia 12/18/2015  . HYPERTENSION, UNSPECIFIED 11/10/2008  . CARDIOMYOPATHY, SECONDARY 11/10/2008  . EDEMA 11/10/2008   Social History   Social History  . Marital status: Single     Spouse name: N/A  . Number of children: N/A  . Years of education: N/A   Occupational History  . Full time works in [employer]    Social History Main Topics  . Smoking status: Never Smoker  . Smokeless tobacco: Never Used  . Alcohol use No  . Drug use: No  . Sexual activity: Not on file   Other Topics Concern  . Not on file   Social History Narrative   Single   Gets regular exercise     Prior to Admission medications   Medication Sig Start Date End Date Taking? Authorizing Provider  alum & mag hydroxide-simeth (MAALOX/MYLANTA) 200-200-20 MG/5ML suspension Take 15 mLs by mouth every 4 (four) hours as needed for indigestion or heartburn. 05/16/16   Katharina Caper, MD  budesonide-formoterol (SYMBICORT) 160-4.5 MCG/ACT inhaler Inhale 2 puffs into the lungs 2 (two) times daily.    [provider]  Calcium Carb-Cholecalciferol (CALCIUM 600+D) 600-800 MG-UNIT TABS Take 1 tablet by mouth daily.    [provider]  Choline Fenofibrate (FENOFIBRIC ACID) 135 MG CPDR Take 135 mg by mouth daily.  07/19/15   [provider]  doxycycline (VIBRA-TABS) 100 MG tablet Take 1 tablet (100 mg total) by mouth 2 (two) times daily. 12/04/16   Helane Gunther, DPM  furosemide (LASIX) 20 MG tablet Take 20 mg by mouth daily.      [provider]  glimepiride (AMARYL) 2 MG tablet Take 2 mg by mouth daily  with breakfast. Only if sugar is over 150    [provider]  Iron, Ferrous Sulfate, 142 (45 Fe) MG TBCR Take 90 mg of iron by mouth daily. 2 a day    [provider]  lansoprazole (PREVACID) 30 MG capsule Take 30 mg by mouth daily.  06/01/15   [provider]  levothyroxine (SYNTHROID, LEVOTHROID) 100 MCG tablet Take 100 mcg by mouth daily.      [provider]  linagliptin (TRADJENTA) 5 MG TABS tablet Take 0.5 tablets (2.5 mg total) by mouth daily. 05/17/16   Katharina Caper, MD  lisinopril (PRINIVIL,ZESTRIL) 20 MG tablet Take 20 mg by  mouth daily.      [provider]  metFORMIN (GLUCOPHAGE-XR) 500 MG 24 hr tablet Take 500 mg by mouth 2 (two) times daily. 11/22/16   [provider]  Multiple Vitamins-Minerals (ONE-A-DAY WOMENS 50 PLUS PO) Take 1 tablet by mouth daily.    [provider]  potassium chloride (KLOR-CON) 10 MEQ CR tablet Take 10 mEq by mouth daily.      [provider]  SPIRIVA HANDIHALER 18 MCG inhalation capsule Place 18 mcg into inhaler and inhale daily.  07/19/15   [provider]  vitamin C (ASCORBIC ACID) 500 MG tablet Take 500 mg by mouth daily.    [provider]    Allergies Patient has no known allergies.    Review of Systems Patient denies headaches, rhinorrhea, blurry vision, numbness, shortness of breath, chest pain, edema, cough, abdominal pain, nausea, vomiting, diarrhea, dysuria, fevers, rashes or hallucinations unless otherwise stated above in HPI. ____________________________________________   PHYSICAL EXAM:  VITAL SIGNS: Vitals:   12/12/16 0626  BP: (!) 140/107  Pulse: 70  Resp: 18  Temp: 97.9 F (36.6 C)    Constitutional: Alert and oriented. in no acute distress. Eyes: Conjunctivae are normal.  Head: Atraumatic. Nose: No congestion/rhinnorhea. Mouth/Throat: Mucous membranes are moist.   Neck: No stridor. Painless ROM.  Cardiovascular: Normal rate, regular rhythm. Grossly normal heart sounds.  Good peripheral circulation. Respiratory: Normal respiratory effort.  No retractions. Lungs CTAB. Gastrointestinal: Soft and nontender. No distention. No abdominal bruits. No CVA tenderness. Musculoskeletal: No lower extremity tenderness nor edema.  No joint effusions. Neurologic:  Normal speech and language. No gross focal neurologic deficits are appreciated. No facial droop Skin:  Skin is warm, dry and intact. No rash noted. Psychiatric: Mood and affect are normal. Speech and behavior are  normal.  ____________________________________________   LABS (all labs ordered are listed, but only abnormal results are displayed)  Results for orders placed or performed during the hospital encounter of 12/12/16 (from the past 24 hour(s))  Glucose, capillary     Status: None   Collection Time: 12/12/16  6:29 AM  Result Value Ref Range   Glucose-Capillary 67 65 - 99 mg/dL  CBC     Status: Abnormal   Collection Time: 12/12/16  6:31 AM  Result Value Ref Range   WBC 8.3 3.6 - 11.0 K/uL   RBC 3.60 (L) 3.80 - 5.20 MIL/uL   Hemoglobin 10.6 (L) 12.0 - 16.0 g/dL   HCT 09.8 (L) 11.9 - 14.7 %   MCV 88.4 80.0 - 100.0 fL   MCH 29.5 26.0 - 34.0 pg   MCHC 33.4 32.0 - 36.0 g/dL   RDW 82.9 56.2 - 13.0 %   Platelets 347 150 - 440 K/uL  Glucose, capillary     Status: Abnormal   Collection Time: 12/12/16  7:09 AM  Result Value Ref Range   Glucose-Capillary 61 (L) 65 - 99 mg/dL   ____________________________________________  EKG My review and personal interpretation at Time: 6:27 Indication: hypoglycemia Rate: 70  Rhythm: sinus Axis: normal Other: normal intervals, no STEMI ____________________________________________  RADIOLOGY   ____________________________________________   PROCEDURES  Procedure(s) performed:  Procedures    Critical Care performed: no ____________________________________________   INITIAL IMPRESSION / ASSESSMENT AND PLAN / ED COURSE  Pertinent labs & imaging results that were available during my care of the patient were reviewed by me and considered in my medical decision making (see chart for details).  DDX hypoglcyemia, med overdose, sepsis, uti  TATUMN SWIECICKI is a 61 y.o. who presents to the ED with A chief complaint of symptomatic hypoglycemia as described above. Patient hemodynamic stable but has persistent hypoglycemia despite D50 and oral orange juice. Patient was given half an amp of D50 in a row and had appropriate improvement in her blood  sugar with rapid fall upon arrival to the ER. The patient will be placed on continuous pulse oximetry and telemetry for monitoring.  Laboratory evaluation will be sent to evaluate for the above complaints.    ----------------------------------------- 7:30 AM on 12/12/2016 -----------------------------------------   OBSERVATION CARE: This patient is being placed under observation care for the following reasons: symptomatic hypoglycemia requiring frequent glucose checks and monitoring   Clinical Course as of Dec 12 1357  Tue Dec 12, 2016  0823 Patient reassessed. Renal function roughly at baseline. Repeat glucose 140. Patient is tolerating oral hydration. We'll continue to monitor.  [PR]  [phone] Known intensive urinary tract infection. We'll continue to monitor.  [PR]  1141 Repeat glucose is stabilizing. Patient remains asymptomatic. We have now observed her outside of the window of the half-life of the tradjenta taken last night.  [PR]  1241 Patient tolerating PO.   ----------------------------------------- 12:41 PM on @EDTODAY @ -----------------------------------------   END OF OBSERVATION STATUS: After an appropriate period of observation, this patient is being discharged due to the following reason(s):  Patient tolerating PO.  Glucose stabilized.     [PR]    Clinical Course User Index [PR] Willy Eddy, MD     ____________________________________________   FINAL CLINICAL IMPRESSION(S) / ED DIAGNOSES  Final diagnoses:  Hypoglycemia      NEW MEDICATIONS STARTED DURING THIS VISIT:  New Prescriptions   No medications on file     Note:  This document was prepared using Dragon voice recognition software and may include unintentional dictation errors.    Willy Eddy, MD 12/12/16 1400

## 2016-12-25 ENCOUNTER — Other Ambulatory Visit: Payer: Medicare Other

## 2016-12-26 ENCOUNTER — Inpatient Hospital Stay: Payer: Medicare Other | Attending: Oncology

## 2016-12-26 DIAGNOSIS — I129 Hypertensive chronic kidney disease with stage 1 through stage 4 chronic kidney disease, or unspecified chronic kidney disease: Secondary | ICD-10-CM | POA: Insufficient documentation

## 2016-12-26 DIAGNOSIS — D631 Anemia in chronic kidney disease: Secondary | ICD-10-CM | POA: Diagnosis not present

## 2016-12-26 DIAGNOSIS — N189 Chronic kidney disease, unspecified: Secondary | ICD-10-CM | POA: Diagnosis not present

## 2016-12-26 DIAGNOSIS — D638 Anemia in other chronic diseases classified elsewhere: Secondary | ICD-10-CM

## 2016-12-26 LAB — CBC
HCT: 26.4 % — ABNORMAL LOW (ref 35.0–47.0)
HEMOGLOBIN: 9.2 g/dL — AB (ref 12.0–16.0)
MCH: 30.6 pg (ref 26.0–34.0)
MCHC: 35 g/dL (ref 32.0–36.0)
MCV: 87.7 fL (ref 80.0–100.0)
Platelets: 331 10*3/uL (ref 150–440)
RBC: 3.02 MIL/uL — ABNORMAL LOW (ref 3.80–5.20)
RDW: 13.1 % (ref 11.5–14.5)
WBC: 8.4 10*3/uL (ref 3.6–11.0)

## 2016-12-26 LAB — COMPREHENSIVE METABOLIC PANEL
ALK PHOS: 58 U/L (ref 38–126)
ALT: 14 U/L (ref 14–54)
ANION GAP: 8 (ref 5–15)
AST: 17 U/L (ref 15–41)
Albumin: 3.7 g/dL (ref 3.5–5.0)
BUN: 24 mg/dL — ABNORMAL HIGH (ref 6–20)
CALCIUM: 9.3 mg/dL (ref 8.9–10.3)
CO2: 25 mmol/L (ref 22–32)
Chloride: 99 mmol/L — ABNORMAL LOW (ref 101–111)
Creatinine, Ser: 1.07 mg/dL — ABNORMAL HIGH (ref 0.44–1.00)
GFR calc non Af Amer: 55 mL/min — ABNORMAL LOW (ref >60)
Glucose, Bld: 172 mg/dL — ABNORMAL HIGH (ref 65–99)
POTASSIUM: 4.3 mmol/L (ref 3.5–5.1)
SODIUM: 132 mmol/L — AB (ref 135–145)
TOTAL PROTEIN: 6.8 g/dL (ref 6.5–8.1)
Total Bilirubin: 0.3 mg/dL (ref 0.3–1.2)

## 2016-12-26 LAB — IRON AND TIBC
Iron: 60 ug/dL (ref 28–170)
SATURATION RATIOS: 17 % (ref 10.4–31.8)
TIBC: 355 ug/dL (ref 250–450)
UIBC: 296 ug/dL

## 2016-12-26 LAB — VITAMIN B12: Vitamin B-12: 385 pg/mL (ref 180–914)

## 2016-12-27 DIAGNOSIS — E119 Type 2 diabetes mellitus without complications: Secondary | ICD-10-CM | POA: Diagnosis not present

## 2016-12-27 DIAGNOSIS — I10 Essential (primary) hypertension: Secondary | ICD-10-CM | POA: Diagnosis not present

## 2016-12-27 DIAGNOSIS — E559 Vitamin D deficiency, unspecified: Secondary | ICD-10-CM | POA: Diagnosis not present

## 2016-12-27 DIAGNOSIS — E784 Other hyperlipidemia: Secondary | ICD-10-CM | POA: Diagnosis not present

## 2016-12-27 DIAGNOSIS — E7800 Pure hypercholesterolemia, unspecified: Secondary | ICD-10-CM | POA: Diagnosis not present

## 2016-12-27 DIAGNOSIS — E039 Hypothyroidism, unspecified: Secondary | ICD-10-CM | POA: Diagnosis not present

## 2017-01-03 ENCOUNTER — Telehealth: Payer: Self-pay | Admitting: *Deleted

## 2017-01-03 NOTE — Telephone Encounter (Signed)
Called ans spoke to sister June and told her the hgb 9.2, I told her that Dr. Smith Robert had put in her notes that she has chronic kidney disease and I felt that she would be fine with the PCP making ref. To kidney doctor.  June would like me to confirm that Dr. Smith Robert would be ok and call June back. She would also like to know what doctor to go to and I told her [facility] is who she uses.

## 2017-01-03 NOTE — Telephone Encounter (Signed)
Called asking for results of labs from 7/10. Her PCP is referring her to nephrology for kidney failure and she wants to know if Dr Smith Robert is in agreement

## 2017-01-04 NOTE — Telephone Encounter (Signed)
Can you discuss this case with me? Per my notes- her recnt bmp shows that her creatinine is actually improved and she does not have CKD.  I cannot truly attribute her anemia to CKD

## 2017-01-10 NOTE — Telephone Encounter (Signed)
I spoke to Emma Fletcher and she wanted you and Dr. Doree Barthel to speak about her creat and the possibility of kidney doctor ref.  Both of you have  Spoke to each other and because she does go up and down with creat. It would be agreeable to have the kidney md consult.  I called her sister Emma Fletcher who takes her to all appts and let her know that both doctors are in agreement of the referral. Emma Fletcher states she has not heard back from Dr. Doree Barthel and I told her maybe calling the office to let them know both doctors are in agreement and they can proceed with referral.

## 2017-01-23 ENCOUNTER — Inpatient Hospital Stay: Payer: Medicare Other | Attending: Oncology

## 2017-01-23 DIAGNOSIS — I129 Hypertensive chronic kidney disease with stage 1 through stage 4 chronic kidney disease, or unspecified chronic kidney disease: Secondary | ICD-10-CM | POA: Insufficient documentation

## 2017-01-23 DIAGNOSIS — D631 Anemia in chronic kidney disease: Secondary | ICD-10-CM | POA: Diagnosis not present

## 2017-01-23 DIAGNOSIS — D638 Anemia in other chronic diseases classified elsewhere: Secondary | ICD-10-CM

## 2017-01-23 DIAGNOSIS — N189 Chronic kidney disease, unspecified: Secondary | ICD-10-CM | POA: Insufficient documentation

## 2017-01-23 LAB — COMPREHENSIVE METABOLIC PANEL
ALK PHOS: 96 U/L (ref 38–126)
ALT: 15 U/L (ref 14–54)
ANION GAP: 4 — AB (ref 5–15)
AST: 17 U/L (ref 15–41)
Albumin: 3.5 g/dL (ref 3.5–5.0)
BILIRUBIN TOTAL: 0.3 mg/dL (ref 0.3–1.2)
BUN: 21 mg/dL — AB (ref 6–20)
CALCIUM: 9.5 mg/dL (ref 8.9–10.3)
CO2: 27 mmol/L (ref 22–32)
CREATININE: 0.87 mg/dL (ref 0.44–1.00)
Chloride: 102 mmol/L (ref 101–111)
GFR calc Af Amer: >60 mL/min (ref >60)
GFR calc non Af Amer: >60 mL/min (ref >60)
GLUCOSE: 114 mg/dL — AB (ref 65–99)
Potassium: 4.7 mmol/L (ref 3.5–5.1)
SODIUM: 133 mmol/L — AB (ref 135–145)
TOTAL PROTEIN: 6.5 g/dL (ref 6.5–8.1)

## 2017-01-23 LAB — CBC
HCT: 28.2 % — ABNORMAL LOW (ref 35.0–47.0)
HEMOGLOBIN: 9.9 g/dL — AB (ref 12.0–16.0)
MCH: 31 pg (ref 26.0–34.0)
MCHC: 35.3 g/dL (ref 32.0–36.0)
MCV: 87.7 fL (ref 80.0–100.0)
Platelets: 323 10*3/uL (ref 150–440)
RBC: 3.21 MIL/uL — ABNORMAL LOW (ref 3.80–5.20)
RDW: 13.3 % (ref 11.5–14.5)
WBC: 6.7 10*3/uL (ref 3.6–11.0)

## 2017-01-23 LAB — IRON AND TIBC
Iron: 54 ug/dL (ref 28–170)
Saturation Ratios: 20 % (ref 10.4–31.8)
TIBC: 270 ug/dL (ref 250–450)
UIBC: 216 ug/dL

## 2017-01-23 LAB — VITAMIN B12: Vitamin B-12: 629 pg/mL (ref 180–914)

## 2017-02-23 ENCOUNTER — Inpatient Hospital Stay: Payer: Medicare Other

## 2017-02-23 ENCOUNTER — Inpatient Hospital Stay (HOSPITAL_BASED_OUTPATIENT_CLINIC_OR_DEPARTMENT_OTHER): Payer: Medicare Other | Admitting: Oncology

## 2017-02-23 ENCOUNTER — Inpatient Hospital Stay: Payer: Medicare Other | Attending: Oncology

## 2017-02-23 VITALS — BP 133/81 | HR 72 | Temp 95.8°F | Resp 18 | Wt 228.4 lb

## 2017-02-23 DIAGNOSIS — G4730 Sleep apnea, unspecified: Secondary | ICD-10-CM | POA: Diagnosis not present

## 2017-02-23 DIAGNOSIS — E871 Hypo-osmolality and hyponatremia: Secondary | ICD-10-CM | POA: Diagnosis not present

## 2017-02-23 DIAGNOSIS — E039 Hypothyroidism, unspecified: Secondary | ICD-10-CM

## 2017-02-23 DIAGNOSIS — E119 Type 2 diabetes mellitus without complications: Secondary | ICD-10-CM | POA: Diagnosis not present

## 2017-02-23 DIAGNOSIS — N189 Chronic kidney disease, unspecified: Secondary | ICD-10-CM | POA: Insufficient documentation

## 2017-02-23 DIAGNOSIS — Z79899 Other long term (current) drug therapy: Secondary | ICD-10-CM | POA: Diagnosis not present

## 2017-02-23 DIAGNOSIS — R6250 Unspecified lack of expected normal physiological development in childhood: Secondary | ICD-10-CM | POA: Diagnosis not present

## 2017-02-23 DIAGNOSIS — E669 Obesity, unspecified: Secondary | ICD-10-CM

## 2017-02-23 DIAGNOSIS — Z7984 Long term (current) use of oral hypoglycemic drugs: Secondary | ICD-10-CM

## 2017-02-23 DIAGNOSIS — I129 Hypertensive chronic kidney disease with stage 1 through stage 4 chronic kidney disease, or unspecified chronic kidney disease: Secondary | ICD-10-CM

## 2017-02-23 DIAGNOSIS — R609 Edema, unspecified: Secondary | ICD-10-CM | POA: Diagnosis not present

## 2017-02-23 DIAGNOSIS — D631 Anemia in chronic kidney disease: Secondary | ICD-10-CM | POA: Insufficient documentation

## 2017-02-23 DIAGNOSIS — D649 Anemia, unspecified: Secondary | ICD-10-CM

## 2017-02-23 DIAGNOSIS — J449 Chronic obstructive pulmonary disease, unspecified: Secondary | ICD-10-CM | POA: Insufficient documentation

## 2017-02-23 DIAGNOSIS — D638 Anemia in other chronic diseases classified elsewhere: Secondary | ICD-10-CM

## 2017-02-23 DIAGNOSIS — E7800 Pure hypercholesterolemia, unspecified: Secondary | ICD-10-CM | POA: Diagnosis not present

## 2017-02-23 DIAGNOSIS — I429 Cardiomyopathy, unspecified: Secondary | ICD-10-CM | POA: Insufficient documentation

## 2017-02-23 DIAGNOSIS — E878 Other disorders of electrolyte and fluid balance, not elsewhere classified: Secondary | ICD-10-CM | POA: Diagnosis not present

## 2017-02-23 LAB — VITAMIN B12: Vitamin B-12: 537 pg/mL (ref 180–914)

## 2017-02-23 LAB — COMPREHENSIVE METABOLIC PANEL
ALT: 14 U/L (ref 14–54)
ANION GAP: 10 (ref 5–15)
AST: 18 U/L (ref 15–41)
Albumin: 3.8 g/dL (ref 3.5–5.0)
Alkaline Phosphatase: 101 U/L (ref 38–126)
BUN: 22 mg/dL — ABNORMAL HIGH (ref 6–20)
CO2: 24 mmol/L (ref 22–32)
Calcium: 9.4 mg/dL (ref 8.9–10.3)
Chloride: 93 mmol/L — ABNORMAL LOW (ref 101–111)
Creatinine, Ser: 0.88 mg/dL (ref 0.44–1.00)
GFR calc non Af Amer: >60 mL/min (ref >60)
Glucose, Bld: 150 mg/dL — ABNORMAL HIGH (ref 65–99)
POTASSIUM: 4.8 mmol/L (ref 3.5–5.1)
SODIUM: 127 mmol/L — AB (ref 135–145)
TOTAL PROTEIN: 7 g/dL (ref 6.5–8.1)
Total Bilirubin: 0.5 mg/dL (ref 0.3–1.2)

## 2017-02-23 LAB — CBC
HCT: 29.3 % — ABNORMAL LOW (ref 35.0–47.0)
HEMOGLOBIN: 10.3 g/dL — AB (ref 12.0–16.0)
MCH: 30.8 pg (ref 26.0–34.0)
MCHC: 35 g/dL (ref 32.0–36.0)
MCV: 87.9 fL (ref 80.0–100.0)
Platelets: 314 10*3/uL (ref 150–440)
RBC: 3.34 MIL/uL — ABNORMAL LOW (ref 3.80–5.20)
RDW: 13 % (ref 11.5–14.5)
WBC: 5.8 10*3/uL (ref 3.6–11.0)

## 2017-02-23 LAB — IRON AND TIBC
Iron: 42 ug/dL (ref 28–170)
SATURATION RATIOS: 16 % (ref 10.4–31.8)
TIBC: 270 ug/dL (ref 250–450)
UIBC: 228 ug/dL

## 2017-02-23 NOTE — Progress Notes (Signed)
Here for follow up accomp by sister who helped w history and med info

## 2017-02-23 NOTE — Progress Notes (Signed)
Hematology/Oncology Consult note [facility]  Telephone:([phone] [phone]  Patient Care Team: Lorelee Market, MD as PCP - General (Family Medicine)   Name of the patient: Emma Fletcher  [id number]  02-May-1956   Date of visit: 02/23/17  Diagnosis- normocytic anemia  Chief complaint/ Reason for visit- routine f/u  Heme/Onc history: patient is a 61 year old female with a past medical history significant for CKD, COPD, sleep apnea, type 2 diabetes obesity hypertension,and hypothyroidism amongst other medical problems.CMP from 06/21/2016 showed mildly elevated creatinine of 1.19. CBC showed white count of 8.5, H&H of 9.8/29.8 with an MCV of 87.9 and platelet count of 367. TSH was normal at 1.13. Free T3 andT4 were also within normal limits. She was seen by previous hematologist Dr. Bynum Bellows from [place] and was receiving procrit shots from his office every 2 months. Her last shot was in December 2017. Dr. Murvin Natal left that practice and she has not received any EPO shots since then.   Further workup of the anemia from March 2018 was as follows: CBC showed white count of 5.8, H&H of 9.8/29.2 and a platelet count of 297. CMP was significant for chronic hyponatremia with a sodium of 125, BUN of 27 and creatinine of 1. Ferritin was normal and iron studies are within normal limits. B12 and folate were within normal limits. ESR and CRP was within normal limits. Reticulocyte count was 0.6% which was low for the degree of anemia. Multiple myeloma panel did not reveal any monoclonal protein. Haptoglobin was normal at 171.  Kidney functions have been normal over last 6 months with occasional fluctuations  Interval history- feels well. Denies any complaints  ECOG PS- 1 Pain scale- 0   Review of systems- Review of Systems  Constitutional: Negative for chills, fever, malaise/fatigue and weight loss.  HENT: Negative for congestion, ear discharge and  nosebleeds.   Eyes: Negative for blurred vision.  Respiratory: Negative for cough, hemoptysis, sputum production, shortness of breath and wheezing.   Cardiovascular: Negative for chest pain, palpitations, orthopnea and claudication.  Gastrointestinal: Negative for abdominal pain, blood in stool, constipation, diarrhea, heartburn, melena, nausea and vomiting.  Genitourinary: Negative for dysuria, flank pain, frequency, hematuria and urgency.  Musculoskeletal: Negative for back pain, joint pain and myalgias.  Skin: Negative for rash.  Neurological: Negative for dizziness, tingling, focal weakness, seizures, weakness and headaches.  Endo/Heme/Allergies: Does not bruise/bleed easily.  Psychiatric/Behavioral: Negative for depression and suicidal ideas. The patient does not have insomnia.        No Known Allergies   Past Medical History:  Diagnosis Date  . Anemia   . Cardiomyopathy secondary   . Developmental delay   . Diabetes mellitus without complication ([place])   . Edema   . Hypercholesterolemia   . Unspecified essential hypertension      Past Surgical History:  Procedure Laterality Date  . none      Social History   Social History  . Marital status: Single    Spouse name: N/A  . Number of children: N/A  . Years of education: N/A   Occupational History  . Full time works in [place]  . Smoking status: Never Smoker  . Smokeless tobacco: Never Used  . Alcohol use No  . Drug use: No  . Sexual activity: Not on file   Other Topics Concern  . Not on file   Social History Narrative   Single   Gets regular exercise  Family History  Problem Relation Age of Onset  . Diabetes Mother   . Coronary artery disease Mother   . Diabetes Father   . Stroke Father      Current Outpatient Prescriptions:  .  alum & mag hydroxide-simeth (MAALOX/MYLANTA) 200-200-20 MG/5ML suspension, Take 15 mLs by mouth every 4 (four) hours as needed for  indigestion or heartburn. (Patient not taking: Reported on 12/12/2016), Disp: 355 mL, Rfl: 0 .  budesonide-formoterol (SYMBICORT) 160-4.5 MCG/ACT inhaler, Inhale 2 puffs into the lungs 2 (two) times daily., Disp: , Rfl:  .  Calcium Carb-Cholecalciferol (CALCIUM 600+D) 600-800 MG-UNIT TABS, Take 1 tablet by mouth daily., Disp: , Rfl:  .  cetirizine (ZYRTEC) 10 MG tablet, Chew 10 mg by mouth daily., Disp: , Rfl:  .  Choline Fenofibrate (FENOFIBRIC ACID) 135 MG CPDR, Take 135 mg by mouth daily. , Disp: , Rfl:  .  doxycycline (VIBRA-TABS) 100 MG tablet, Take 1 tablet (100 mg total) by mouth 2 (two) times daily., Disp: 15 tablet, Rfl: 0 .  furosemide (LASIX) 20 MG tablet, Take 20 mg by mouth daily.  , Disp: , Rfl:  .  Iron, Ferrous Sulfate, 142 (45 Fe) MG TBCR, Take 90 mg of iron by mouth daily. 2 a day, Disp: , Rfl:  .  lansoprazole (PREVACID) 30 MG capsule, Take 30 mg by mouth daily. , Disp: , Rfl:  .  levothyroxine (SYNTHROID, LEVOTHROID) 100 MCG tablet, Take 100 mcg by mouth daily.  , Disp: , Rfl:  .  linagliptin (TRADJENTA) 5 MG TABS tablet, Take 0.5 tablets (2.5 mg total) by mouth daily. (Patient taking differently: Take 5 mg by mouth daily. ), Disp: 30 tablet, Rfl: 6 .  lisinopril (PRINIVIL,ZESTRIL) 20 MG tablet, Take 20 mg by mouth daily.  , Disp: , Rfl:  .  metFORMIN (GLUCOPHAGE-XR) 500 MG 24 hr tablet, Take 1,000 mg by mouth every evening. , Disp: , Rfl: 0 .  Multiple Vitamins-Minerals (ONE-A-DAY WOMENS 50 PLUS PO), Take 1 tablet by mouth daily., Disp: , Rfl:  .  potassium chloride (K-DUR,KLOR-CON) 10 MEQ tablet, Take 10 mEq by mouth daily., Disp: , Rfl:  .  potassium chloride (KLOR-CON) 10 MEQ CR tablet, Take 10 mEq by mouth daily.  , Disp: , Rfl:  .  SPIRIVA HANDIHALER 18 MCG inhalation capsule, Place 18 mcg into inhaler and inhale daily. , Disp: , Rfl:  .  vitamin C (ASCORBIC ACID) 500 MG tablet, Take 500 mg by mouth daily., Disp: , Rfl:   Physical exam:  Vitals:   02/23/17 0903  BP:  133/81  Pulse: 72  Resp: 18  Temp: (!) 95.8 F (35.4 C)  TempSrc: Tympanic  Weight: 228 lb 6.4 oz (103.6 kg)   Physical Exam  Constitutional: She is oriented to person, place, and time and well-developed, well-nourished, and in no distress.  HENT:  Head: Normocephalic and atraumatic.  Eyes: Pupils are equal, round, and reactive to light. EOM are normal.  Neck: Normal range of motion.  Cardiovascular: Normal rate, regular rhythm and normal heart sounds.   Pulmonary/Chest: Effort normal and breath sounds normal.  Abdominal: Soft. Bowel sounds are normal.  Neurological: She is alert and oriented to person, place, and time.  Skin: Skin is warm and dry.     CMP Latest Ref Rng & Units 02/23/2017  Glucose 65 - 99 mg/dL 150(H)  BUN 6 - 20 mg/dL 22(H)  Creatinine 0.44 - 1.00 mg/dL 0.88  Sodium 135 - 145 mmol/L 127(L)  Potassium 3.5 -  5.1 mmol/L 4.8  Chloride 101 - 111 mmol/L 93(L)  CO2 22 - 32 mmol/L 24  Calcium 8.9 - 10.3 mg/dL 9.4  Total Protein 6.5 - 8.1 g/dL 7.0  Total Bilirubin 0.3 - 1.2 mg/dL 0.5  Alkaline Phos 38 - 126 U/L 101  AST 15 - 41 U/L 18  ALT 14 - 54 U/L 14   CBC Latest Ref Rng & Units 02/23/2017  WBC 3.6 - 11.0 K/uL 5.8  Hemoglobin 12.0 - 16.0 g/dL 10.3(L)  Hematocrit 35.0 - 47.0 % 29.3(L)  Platelets 150 - 440 K/uL 314      Assessment and plan- Patient is a 61 y.o. female with chronic normocytic anemia of unclear etiology  Patient was previously receiving EPO shots last year for anemia of chronic kidney disease. She has not required since dec 2017. Also kidney functions have been more or less normal with some minor fluctuations. I do not think her anemia tehrefore is due to kidney disease. Repeat cbc, bmp in 4 months and 8 months and I will see ehr in 8 months. Hb stable between 9-10 over last 1 year. Anemia work up otherwise unrevealing. Continue to monitor. No need for bone marrow biopsy at this time  Hyponatremia- sodium 127 today along with hypochloremia.  Patient does not wish to get IVF. I have asked her to increase fluid intake including electrolytes such as Gatorade. Repeat bmp next week   Visit Diagnosis 1. Anemia, unspecified type   2. Hyponatremia      Dr. Randa Evens, MD, MPH [facility] Pager- [phone] 02/23/2017 9:40 AM

## 2017-02-27 ENCOUNTER — Inpatient Hospital Stay: Payer: Medicare Other

## 2017-02-27 DIAGNOSIS — D631 Anemia in chronic kidney disease: Secondary | ICD-10-CM | POA: Diagnosis not present

## 2017-02-27 DIAGNOSIS — E878 Other disorders of electrolyte and fluid balance, not elsewhere classified: Secondary | ICD-10-CM | POA: Diagnosis not present

## 2017-02-27 DIAGNOSIS — N189 Chronic kidney disease, unspecified: Secondary | ICD-10-CM | POA: Diagnosis not present

## 2017-02-27 DIAGNOSIS — E871 Hypo-osmolality and hyponatremia: Secondary | ICD-10-CM | POA: Diagnosis not present

## 2017-02-27 DIAGNOSIS — D649 Anemia, unspecified: Secondary | ICD-10-CM

## 2017-02-27 DIAGNOSIS — J449 Chronic obstructive pulmonary disease, unspecified: Secondary | ICD-10-CM | POA: Diagnosis not present

## 2017-02-27 DIAGNOSIS — I129 Hypertensive chronic kidney disease with stage 1 through stage 4 chronic kidney disease, or unspecified chronic kidney disease: Secondary | ICD-10-CM | POA: Diagnosis not present

## 2017-02-27 LAB — BASIC METABOLIC PANEL
Anion gap: 4 — ABNORMAL LOW (ref 5–15)
BUN: 18 mg/dL (ref 6–20)
CHLORIDE: 99 mmol/L — AB (ref 101–111)
CO2: 28 mmol/L (ref 22–32)
CREATININE: 0.86 mg/dL (ref 0.44–1.00)
Calcium: 9.4 mg/dL (ref 8.9–10.3)
GFR calc Af Amer: >60 mL/min (ref >60)
Glucose, Bld: 132 mg/dL — ABNORMAL HIGH (ref 65–99)
POTASSIUM: 4.6 mmol/L (ref 3.5–5.1)
SODIUM: 131 mmol/L — AB (ref 135–145)

## 2017-03-05 ENCOUNTER — Ambulatory Visit (INDEPENDENT_AMBULATORY_CARE_PROVIDER_SITE_OTHER): Payer: Medicare Other | Admitting: Podiatry

## 2017-03-05 DIAGNOSIS — M79676 Pain in unspecified toe(s): Secondary | ICD-10-CM | POA: Diagnosis not present

## 2017-03-05 DIAGNOSIS — B351 Tinea unguium: Secondary | ICD-10-CM | POA: Diagnosis not present

## 2017-03-05 DIAGNOSIS — E119 Type 2 diabetes mellitus without complications: Secondary | ICD-10-CM

## 2017-03-05 NOTE — Progress Notes (Signed)
Complaint:  Visit Type: Patient returns to my office for continued preventative foot care services. Complaint: Patient states" my nails have grown long and thick and become painful to walk and wear shoes" Patient has been diagnosed with DM with no foot complications. The patient presents for preventative foot care services. No changes to ROS.  She gives a vague history of insect bite right foot.  She is concerned about the bite.  Podiatric Exam: Vascular: dorsalis pedis and posterior tibial pulses are palpable bilateral. Capillary return is immediate. Temperature gradient is WNL. Skin turgor WNL  Sensorium: Normal Semmes Weinstein monofilament test. Normal tactile sensation bilaterally. Nail Exam: Pt has thick disfigured discolored nails with subungual debris noted bilateral entire nail hallux through fifth toenails Ulcer Exam: There is no evidence of ulcer or pre-ulcerative changes or infection. Orthopedic Exam: Muscle tone and strength are WNL. No limitations in general ROM. No crepitus or effusions noted. Foot type and digits show no abnormalities. Bony prominences are unremarkable. Skin: No Porokeratosis. No infection or ulcers  Diagnosis:  Onychomycosis, , Pain in right toe, pain in left toes  Treatment & Plan Procedures and Treatment: Consent by patient was obtained for treatment procedures. The patient understood the discussion of treatment and procedures well. All questions were answered thoroughly reviewed. Debridement of mycotic and hypertrophic toenails, 1 through 5 bilateral and clearing of subungual debris. No ulceration, no infection noted. Prescribed doxycycline to treat insect bite in case it is a tick bite. Return Visit-Office Procedure: Patient instructed to return to the office for a follow up visit 3 months for continued evaluation and treatment.    Scheryl Sanborn DPM 

## 2017-03-07 DIAGNOSIS — I129 Hypertensive chronic kidney disease with stage 1 through stage 4 chronic kidney disease, or unspecified chronic kidney disease: Secondary | ICD-10-CM | POA: Diagnosis not present

## 2017-03-07 DIAGNOSIS — N182 Chronic kidney disease, stage 2 (mild): Secondary | ICD-10-CM | POA: Diagnosis not present

## 2017-03-07 DIAGNOSIS — E871 Hypo-osmolality and hyponatremia: Secondary | ICD-10-CM | POA: Diagnosis not present

## 2017-04-04 DIAGNOSIS — E039 Hypothyroidism, unspecified: Secondary | ICD-10-CM | POA: Diagnosis not present

## 2017-04-04 DIAGNOSIS — E7800 Pure hypercholesterolemia, unspecified: Secondary | ICD-10-CM | POA: Diagnosis not present

## 2017-04-04 DIAGNOSIS — E559 Vitamin D deficiency, unspecified: Secondary | ICD-10-CM | POA: Diagnosis not present

## 2017-04-04 DIAGNOSIS — E669 Obesity, unspecified: Secondary | ICD-10-CM | POA: Diagnosis not present

## 2017-04-04 DIAGNOSIS — E7849 Other hyperlipidemia: Secondary | ICD-10-CM | POA: Diagnosis not present

## 2017-04-04 DIAGNOSIS — E119 Type 2 diabetes mellitus without complications: Secondary | ICD-10-CM | POA: Diagnosis not present

## 2017-04-04 DIAGNOSIS — I10 Essential (primary) hypertension: Secondary | ICD-10-CM | POA: Diagnosis not present

## 2017-04-09 ENCOUNTER — Ambulatory Visit (INDEPENDENT_AMBULATORY_CARE_PROVIDER_SITE_OTHER): Payer: Medicare Other | Admitting: Podiatry

## 2017-04-09 ENCOUNTER — Other Ambulatory Visit: Payer: Self-pay | Admitting: Podiatry

## 2017-04-09 ENCOUNTER — Ambulatory Visit (INDEPENDENT_AMBULATORY_CARE_PROVIDER_SITE_OTHER): Payer: Medicare Other

## 2017-04-09 DIAGNOSIS — M25571 Pain in right ankle and joints of right foot: Secondary | ICD-10-CM | POA: Diagnosis not present

## 2017-04-09 DIAGNOSIS — M7671 Peroneal tendinitis, right leg: Secondary | ICD-10-CM

## 2017-04-09 DIAGNOSIS — M722 Plantar fascial fibromatosis: Secondary | ICD-10-CM

## 2017-04-09 NOTE — Progress Notes (Signed)
This patient presents the office with chief complaint of pain around her right ankle.  She says that her pain has been present for approximately 10 days.  She denies any history of trauma or reinjury to her right foot or ankle.   She says she has felt pain extending from the outside of her right foot extending into her right ankle.   She has provided no self treatment nor sought any professional help. This patient is a poor historian and she says that most of her pain is present upon walking.  She has provided no self treatment nor sought any professional help.  She presents the office today for an evaluation and treatment of her right foot/ankle.  She does admit increasing walking over the last 2 weeks.  General Appearance  Alert, conversant and in no acute stress.  Vascular  Dorsalis pedis and posterior pulses are palpable  bilaterally.  Capillary return is within normal limits  Bilaterally. Temperature is within normal limits  Bilaterally  Neurologic  Senn-Weinstein monofilament wire test within normal limits  bilaterally. Muscle power  Within normal limits bilaterally.  Nails Thick disfigured discolored nails with subungual debride bilaterally from hallux to fifth toes bilaterally. No evidence of bacterial infection or drainage bilaterally.  Orthopedic  No limitations of motion of motion feet bilaterally.  No crepitus or effusions noted.  No bony pathology or digital deformities noted. Palpable pain noted at the insertion of the peroneal brevis tendon at the base of the fifth metatarsal right foot.  Swelling is noted at the insertion of the peroneal brevis insertion right foot  Skin  normotropic skin with no porokeratosis noted bilaterally.  No signs of infections or ulcers noted.    Insertional peroneal brevis tendinitis fifth meta-base, right foot.  ROV.  X-rays taken do reveal calcification at the insertion of Achilles tendon in the plantar fascia right foot.  There is also DJD noted on the  dorsal aspect of her right foot.  There is also calcification noted extending from the base of the fifth metatarsal into the subcutaneous tissue proximal to the base.  No evidence of any fracture is noted.  She does have pain at the insertion of the peroneal brevis. Injection therapy using 1.0 cc. Of 2% xylocaine( 20 mg.) plus 1 cc. of kenalog-la ( 10 mg) plus 1/2 cc. of dexamethazone phosphate ( 2 mg).  Plantar fascial brace was dispensed.  RTC 2 weeks.   Helane Gunther DPM

## 2017-05-22 IMAGING — US US RENAL
1 series · 14 of 25 positions shown · non-contrast
Comparison: None.

CLINICAL DATA: Renal insufficiency

EXAM:
RENAL / URINARY TRACT ULTRASOUND COMPLETE

[Series 1: us renal · 0.23mm/px · 14 of 48 slices shown]
[im 1/48]
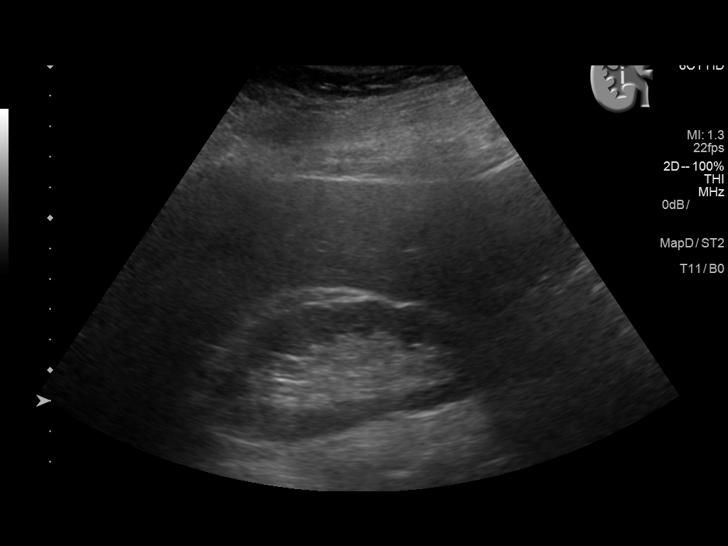
[im 4/48]
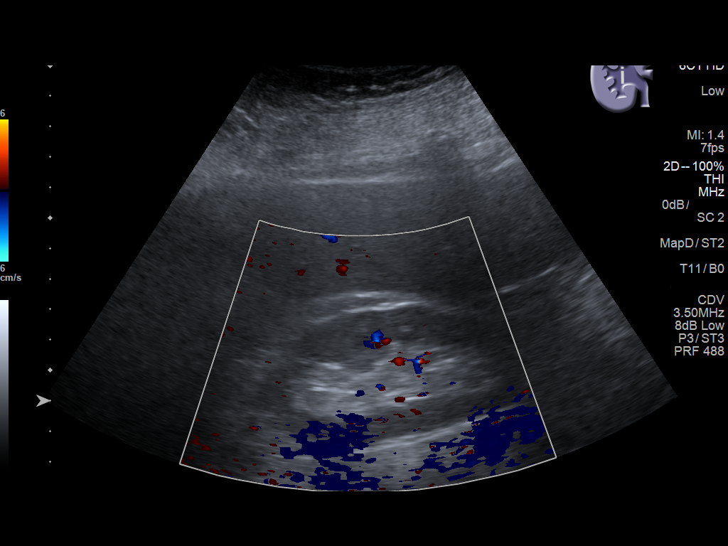
[im 8/48]
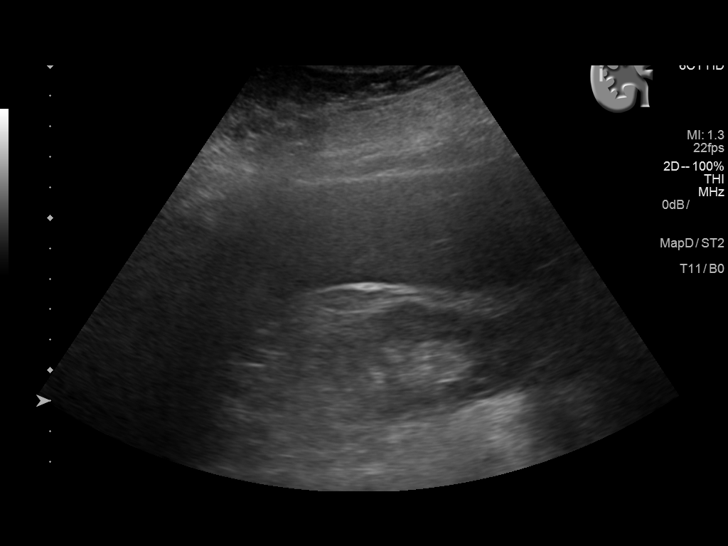
[im 12/48]
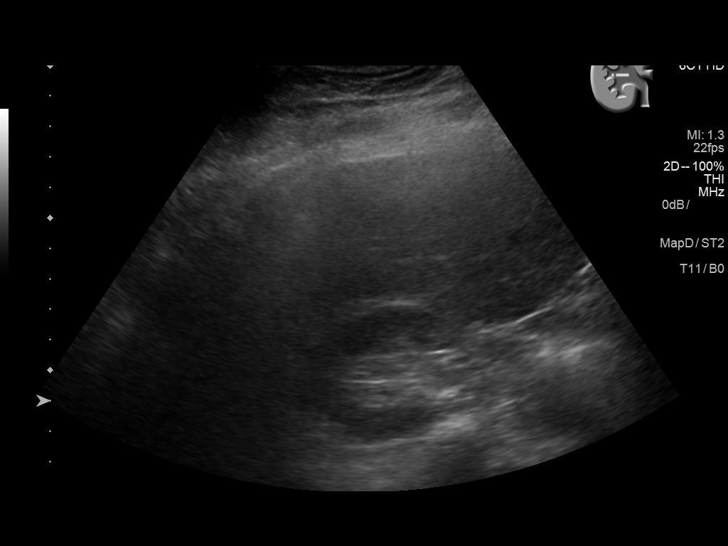
[im 16/48]
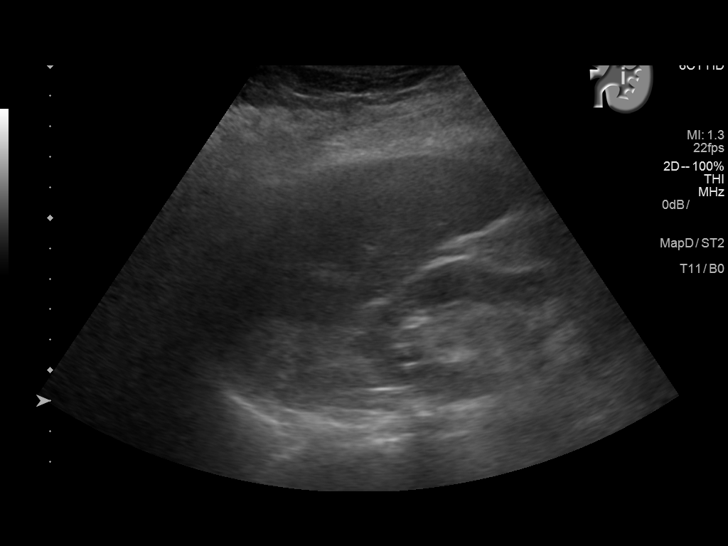
[im 18/48]
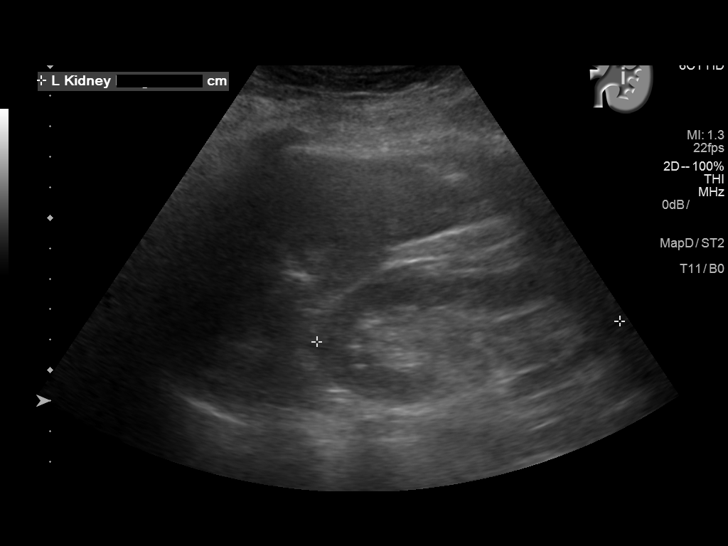
[im 22/48]
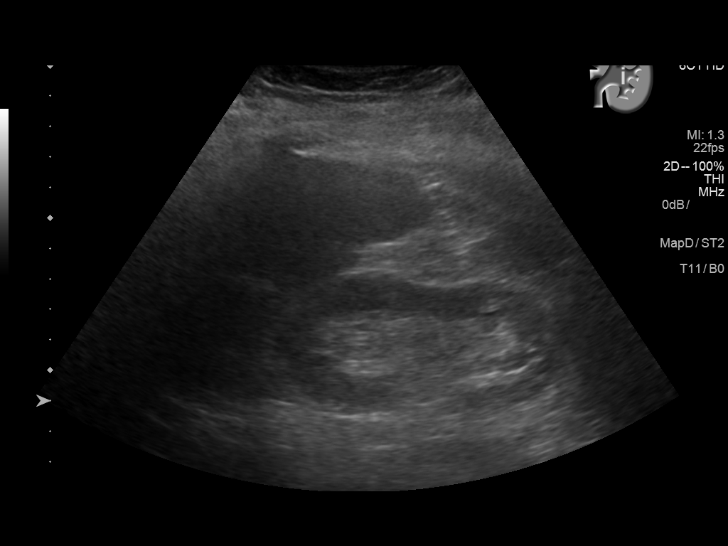
[im 26/48]
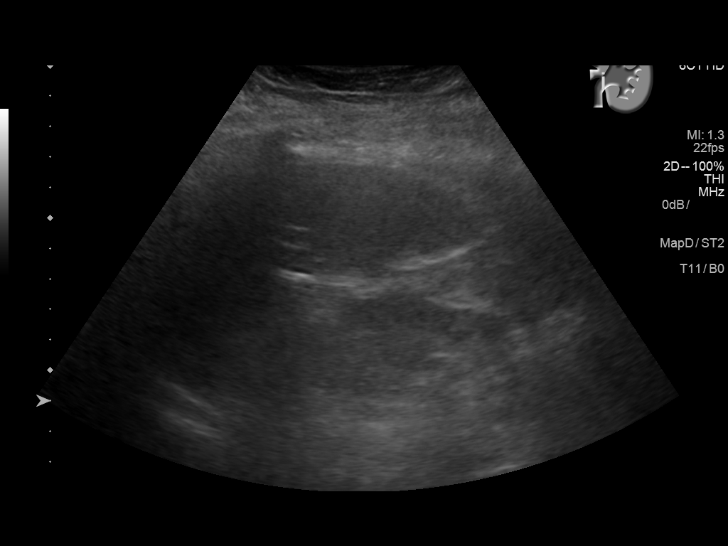
[im 30/48]
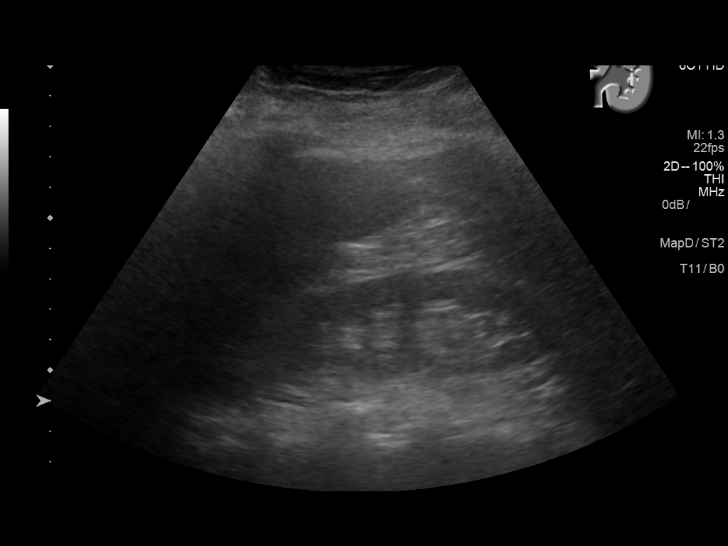
[im 32/48]
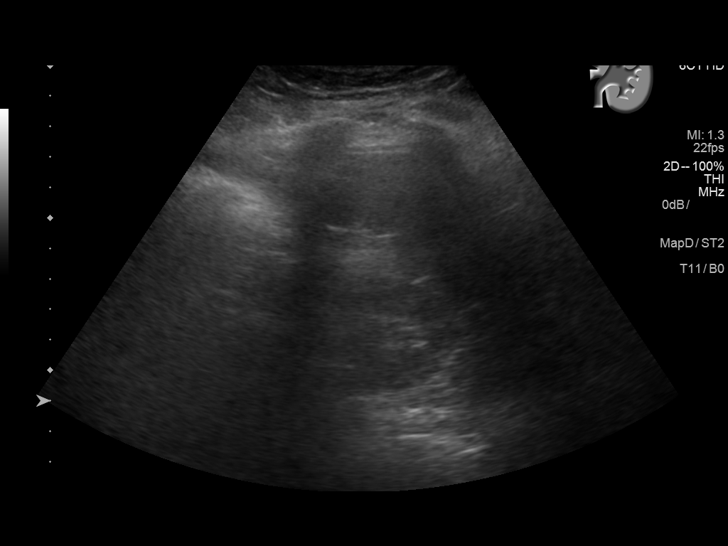
[im 36/48]
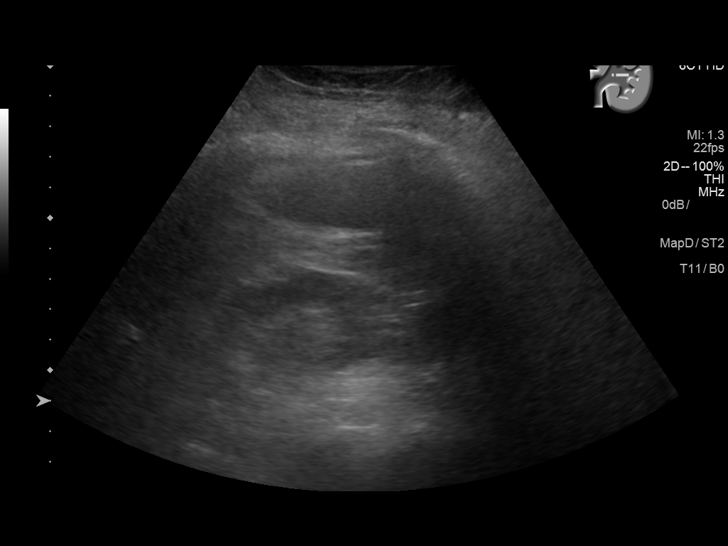
[im 40/48]
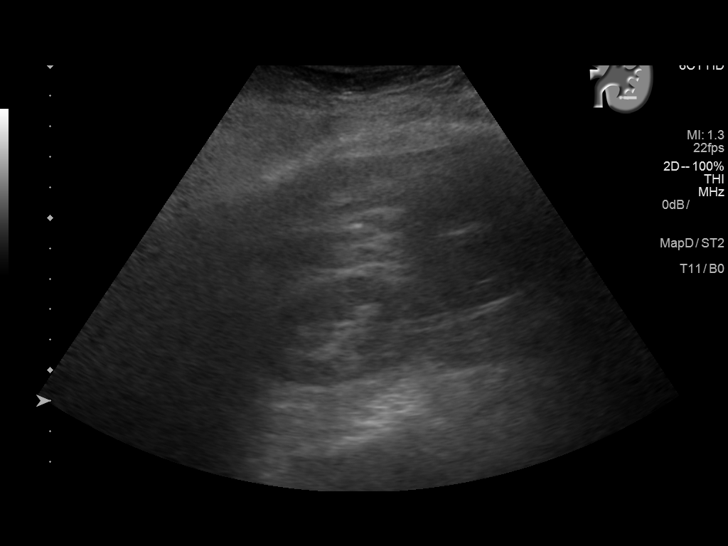
[im 44/48]
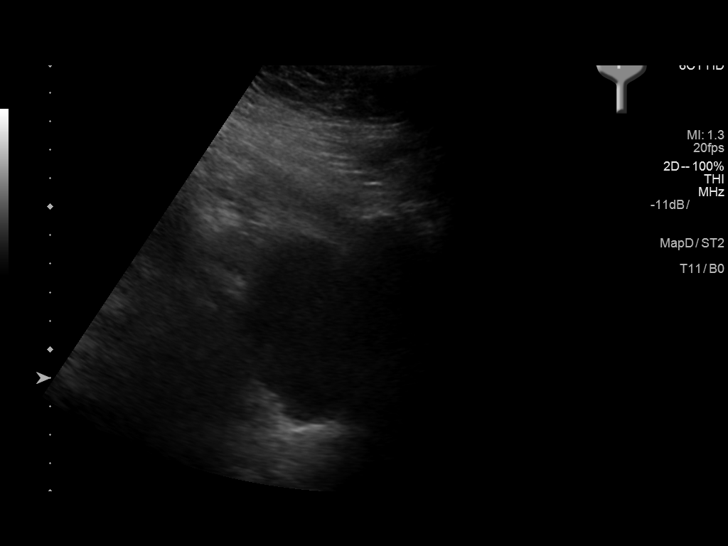
[im 48/48]
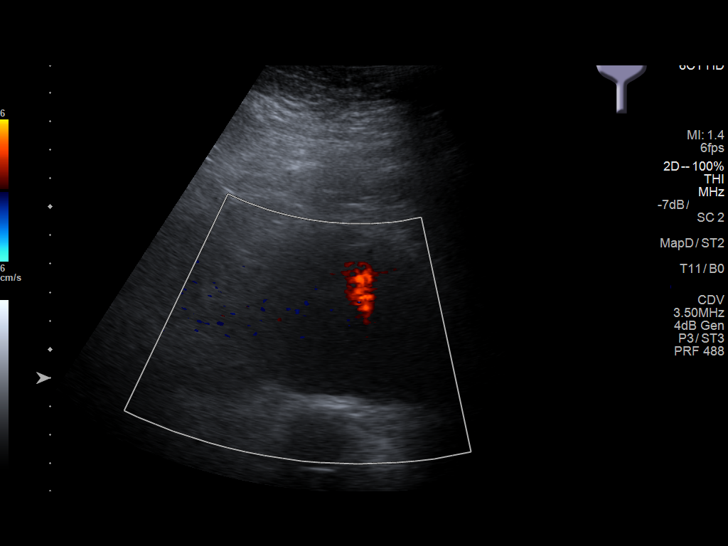

[14 of 25 positions shown; findings below may reference images not displayed]

FINDINGS: Right Kidney:

Length: 10 cm. Echogenicity within normal limits. No mass or
hydronephrosis visualized.

Left Kidney:

Length: 9.8 cm.. Echogenicity within normal limits. No mass or
hydronephrosis visualized.

Bladder:

Appears normal for degree of bladder distention.
IMPRESSION: No acute abnormality noted.

## 2017-06-04 ENCOUNTER — Encounter: Payer: Self-pay | Admitting: Podiatry

## 2017-06-04 ENCOUNTER — Ambulatory Visit (INDEPENDENT_AMBULATORY_CARE_PROVIDER_SITE_OTHER): Payer: Medicare Other | Admitting: Podiatry

## 2017-06-04 ENCOUNTER — Ambulatory Visit (INDEPENDENT_AMBULATORY_CARE_PROVIDER_SITE_OTHER): Payer: Medicare Other

## 2017-06-04 DIAGNOSIS — S93602A Unspecified sprain of left foot, initial encounter: Secondary | ICD-10-CM

## 2017-06-04 DIAGNOSIS — M129 Arthropathy, unspecified: Secondary | ICD-10-CM | POA: Diagnosis not present

## 2017-06-04 DIAGNOSIS — M79672 Pain in left foot: Secondary | ICD-10-CM | POA: Diagnosis not present

## 2017-06-04 DIAGNOSIS — S92902A Unspecified fracture of left foot, initial encounter for closed fracture: Secondary | ICD-10-CM

## 2017-06-04 MED ORDER — MELOXICAM 15 MG PO TABS: 15.0000 mg | ORAL_TABLET | Freq: Every day | ORAL | 3 refills | Status: DC

## 2017-06-04 NOTE — Progress Notes (Signed)
This patient presents the office for her preventative foot care services.  Prior to performing the services. It was noted that she had a red, inflamed, swollen area on the top of her left foot behind her second, third and fourth toes.  She does give a history of having fallen approximately 4 weeks ago.  She says she has developed this red, swollen and painful area on the top of her right foot.  She says she still has been walking and wearing her regular footgear to work.  She says she has provided no self treatment nor sought any professional help.  She presents the office today for preventative foot care services, but she was examined for her inflamed left foot and we will have her nails worked on next visit.   General Appearance  Alert, conversant and in no acute stress.  Vascular  Dorsalis pedis and posterior pulses are palpable  bilaterally.  Capillary return is within normal limits  Bilaterally. Temperature is within normal limits  Bilaterally  Neurologic  Senn-Weinstein monofilament wire test within normal limits  bilaterally. Muscle power  Within normal limits bilaterally.  Nails Thick disfigured discolored nails with subungual debride bilaterally from hallux to fifth toes bilaterally. No evidence of bacterial infection or drainage bilaterally.  Orthopedic  No limitations of motion of motion feet bilaterally.  No crepitus or effusions noted. Examination of her left foot reveals no bony pathology except for hammertoe deformity, second digit, both feet.  Skin  normotropic skin with no porokeratosis noted bilaterally.  Patient has redness and swelling noted proximal to the second, third and fourth digits of the left foot.  Palpable pain noted at the second, third and fourth MPJ left foot.  Examination of the skin to the digits of the left foot reveal no openings or break in the skin.   Foot Sprain vs.  Unknown Arthritis.     ROV  X-rays of her left foot reveal calcification at the Achilles  tendon and plantar fascial insertion.  There is calcification along the course of the peroneal brevis tendon., Left foot  . Hammertoe digits bilaterally.  No evidence of any bony pathology or dislocations noted to the second, third and fourth MPJs, left foot.  Evaluation of the foot reveals a possible foot sprain versus an arthritic condition.  Patient was told to ambulate with a surgical shoe and a prescription of Mobic was called in for this patient. She should be off from work until Friday.  She is then make an appointment and return to the office in 2 weeks for further examination as well as nail care.   Helane Gunther DPM

## 2017-06-18 ENCOUNTER — Ambulatory Visit (INDEPENDENT_AMBULATORY_CARE_PROVIDER_SITE_OTHER): Payer: Medicare Other | Admitting: Podiatry

## 2017-06-18 ENCOUNTER — Encounter: Payer: Self-pay | Admitting: Podiatry

## 2017-06-18 DIAGNOSIS — S93602A Unspecified sprain of left foot, initial encounter: Secondary | ICD-10-CM

## 2017-06-18 NOTE — Progress Notes (Signed)
This patient presents the office for continued evaluation of her painful left forefoot.  Patient was diagnosed with a foot sprain versus an unknown arthritis of her left forefoot on 06/04/2017.  She was treated with a surgical shoe and a prescription for Mobic was called into the pharmacy.  She says she has been taking her medicine as well as wearing her surgical shoe.  She says that her foot is doing better but there still  And soreness is present in the left forefoot.  She says she is approximately 50% improved since  her first visit.  She presents the office today for continued evaluation and treatment of her painful left forefoot.   General Appearance  Alert, conversant and in no acute stress.  Vascular  Dorsalis pedis and posterior pulses are palpable  bilaterally.  Capillary return is within normal limits  bilaterally. Temperature is within normal limits  Bilaterally.  Neurologic  Senn-Weinstein monofilament wire test within normal limits  bilaterally. Muscle power within normal limits bilaterally.  Nails Thick disfigured discolored nails with subungual debris bilaterally from hallux to fifth toes bilaterally. No evidence of bacterial infection or drainage bilaterally.  Orthopedic  No limitations of motion of motion feet bilaterally.  No crepitus or effusions noted.  Discoloration noted over the second, third and fourth MPJs, left foot.  Mild swelling but markedly diminished swelling from her initial exam.  Palpable pain persists at the third MPJ left foot.  Skin  normotropic skin with no porokeratosis noted bilaterally.  No signs of infections or ulcers noted.    Foot Sprain left foot.  ROV.  Examination of her foot reveals she is currently clinically marked improved.  Patient was told to continue to ambulate with her surgical shoe and to take Mobic as directed.  Patient was also given a note for her to remain out of work since she is unable to work in [employer] with a surgical shoe. She  was told that in 2 weeks. She can wear her regular footgear and to return to the office in 3 weeks for continued evaluation of her left foot.   Helane Gunther DPM

## 2017-06-20 ENCOUNTER — Inpatient Hospital Stay: Payer: Medicare Other | Attending: Oncology

## 2017-06-20 DIAGNOSIS — D649 Anemia, unspecified: Secondary | ICD-10-CM | POA: Insufficient documentation

## 2017-06-20 LAB — CBC WITH DIFFERENTIAL/PLATELET
BASOS ABS: 0 10*3/uL (ref 0–0.1)
BASOS PCT: 0 %
EOS ABS: 0.1 10*3/uL (ref 0–0.7)
Eosinophils Relative: 1 %
HCT: 28.4 % — ABNORMAL LOW (ref 35.0–47.0)
Hemoglobin: 9.6 g/dL — ABNORMAL LOW (ref 12.0–16.0)
Lymphocytes Relative: 21 %
Lymphs Abs: 1.3 10*3/uL (ref 1.0–3.6)
MCH: 30.3 pg (ref 26.0–34.0)
MCHC: 33.7 g/dL (ref 32.0–36.0)
MCV: 89.8 fL (ref 80.0–100.0)
MONOS PCT: 8 %
Monocytes Absolute: 0.5 10*3/uL (ref 0.2–0.9)
NEUTROS PCT: 70 %
Neutro Abs: 4.5 10*3/uL (ref 1.4–6.5)
Platelets: 322 10*3/uL (ref 150–440)
RBC: 3.16 MIL/uL — ABNORMAL LOW (ref 3.80–5.20)
RDW: 13.1 % (ref 11.5–14.5)
WBC: 6.5 10*3/uL (ref 3.6–11.0)

## 2017-06-20 LAB — BASIC METABOLIC PANEL
ANION GAP: 8 (ref 5–15)
BUN: 40 mg/dL — ABNORMAL HIGH (ref 6–20)
CALCIUM: 9.5 mg/dL (ref 8.9–10.3)
CO2: 26 mmol/L (ref 22–32)
CREATININE: 1.43 mg/dL — AB (ref 0.44–1.00)
Chloride: 96 mmol/L — ABNORMAL LOW (ref 101–111)
GFR, EST AFRICAN AMERICAN: 45 mL/min — AB (ref >60)
GFR, EST NON AFRICAN AMERICAN: 39 mL/min — AB (ref >60)
Glucose, Bld: 162 mg/dL — ABNORMAL HIGH (ref 65–99)
Potassium: 4.6 mmol/L (ref 3.5–5.1)
SODIUM: 130 mmol/L — AB (ref 135–145)

## 2017-06-22 ENCOUNTER — Inpatient Hospital Stay: Payer: Medicare Other

## 2017-06-26 ENCOUNTER — Telehealth: Payer: Self-pay | Admitting: *Deleted

## 2017-06-26 NOTE — Telephone Encounter (Signed)
Patient called asking for return call regarding her results. Please return call [phone]

## 2017-06-26 NOTE — Telephone Encounter (Signed)
Called pt and told her hgb 9.6 and creat 1.46 and checked about her drinking fluids and she has been drinking a lot of soda and I told her she needs to drink more water and cut back on sodas.  I explained the kidney function was worse and in sept it was normal. The hgb came down some but still not a number that she would give a shot for. She wanted me to call her sister and I called June her sister and told her the above and June states that she is drinking lots of tea and sodas and I told her that she should be focused on water and June tells her that all the time.

## 2017-06-30 ENCOUNTER — Other Ambulatory Visit: Payer: Self-pay

## 2017-06-30 ENCOUNTER — Encounter: Payer: Self-pay | Admitting: Emergency Medicine

## 2017-06-30 ENCOUNTER — Emergency Department
Admission: EM | Admit: 2017-06-30 | Discharge: 2017-06-30 | Disposition: A | Discharge status: Home / Self Care | Payer: Medicare Other | Attending: Emergency Medicine | Admitting: Emergency Medicine

## 2017-06-30 ENCOUNTER — Emergency Department: Payer: Medicare Other

## 2017-06-30 DIAGNOSIS — N182 Chronic kidney disease, stage 2 (mild): Secondary | ICD-10-CM | POA: Diagnosis not present

## 2017-06-30 DIAGNOSIS — E1122 Type 2 diabetes mellitus with diabetic chronic kidney disease: Secondary | ICD-10-CM | POA: Insufficient documentation

## 2017-06-30 DIAGNOSIS — Z79899 Other long term (current) drug therapy: Secondary | ICD-10-CM | POA: Diagnosis not present

## 2017-06-30 DIAGNOSIS — R1013 Epigastric pain: Secondary | ICD-10-CM | POA: Diagnosis not present

## 2017-06-30 DIAGNOSIS — R109 Unspecified abdominal pain: Secondary | ICD-10-CM | POA: Diagnosis not present

## 2017-06-30 DIAGNOSIS — Z7984 Long term (current) use of oral hypoglycemic drugs: Secondary | ICD-10-CM | POA: Insufficient documentation

## 2017-06-30 DIAGNOSIS — I129 Hypertensive chronic kidney disease with stage 1 through stage 4 chronic kidney disease, or unspecified chronic kidney disease: Secondary | ICD-10-CM | POA: Diagnosis not present

## 2017-06-30 DIAGNOSIS — R1011 Right upper quadrant pain: Secondary | ICD-10-CM | POA: Diagnosis present

## 2017-06-30 DIAGNOSIS — N390 Urinary tract infection, site not specified: Secondary | ICD-10-CM | POA: Diagnosis not present

## 2017-06-30 LAB — URINALYSIS, COMPLETE (UACMP) WITH MICROSCOPIC
Bilirubin Urine: NEGATIVE
GLUCOSE, UA: NEGATIVE mg/dL
HGB URINE DIPSTICK: NEGATIVE
Ketones, ur: NEGATIVE mg/dL
NITRITE: POSITIVE — AB
Protein, ur: NEGATIVE mg/dL
SPECIFIC GRAVITY, URINE: 1.006 (ref 1.005–1.030)
pH: 5 (ref 5.0–8.0)

## 2017-06-30 LAB — LIPASE, BLOOD: Lipase: 32 U/L (ref 11–51)

## 2017-06-30 LAB — COMPREHENSIVE METABOLIC PANEL
ALBUMIN: 4.3 g/dL (ref 3.5–5.0)
ALK PHOS: 66 U/L (ref 38–126)
ALT: 19 U/L (ref 14–54)
AST: 28 U/L (ref 15–41)
Anion gap: 9 (ref 5–15)
BILIRUBIN TOTAL: 0.6 mg/dL (ref 0.3–1.2)
BUN: 30 mg/dL — ABNORMAL HIGH (ref 6–20)
CALCIUM: 9.6 mg/dL (ref 8.9–10.3)
CO2: 26 mmol/L (ref 22–32)
Chloride: 99 mmol/L — ABNORMAL LOW (ref 101–111)
Creatinine, Ser: 1.28 mg/dL — ABNORMAL HIGH (ref 0.44–1.00)
GFR, EST AFRICAN AMERICAN: 52 mL/min — AB (ref >60)
GFR, EST NON AFRICAN AMERICAN: 45 mL/min — AB (ref >60)
GLUCOSE: 117 mg/dL — AB (ref 65–99)
Potassium: 4.2 mmol/L (ref 3.5–5.1)
Sodium: 134 mmol/L — ABNORMAL LOW (ref 135–145)
TOTAL PROTEIN: 7.7 g/dL (ref 6.5–8.1)

## 2017-06-30 LAB — CBC
HCT: 30 % — ABNORMAL LOW (ref 35.0–47.0)
HEMOGLOBIN: 10 g/dL — AB (ref 12.0–16.0)
MCH: 30.1 pg (ref 26.0–34.0)
MCHC: 33.4 g/dL (ref 32.0–36.0)
MCV: 90.2 fL (ref 80.0–100.0)
Platelets: 301 10*3/uL (ref 150–440)
RBC: 3.33 MIL/uL — ABNORMAL LOW (ref 3.80–5.20)
RDW: 13.2 % (ref 11.5–14.5)
WBC: 5.4 10*3/uL (ref 3.6–11.0)

## 2017-06-30 MED ORDER — CEPHALEXIN 500 MG PO CAPS: 500.0000 mg | ORAL_CAPSULE | Freq: Three times a day (TID) | ORAL | 0 refills | Status: DC

## 2017-06-30 MED ORDER — SUCRALFATE 1 G PO TABS: 1.0000 g | ORAL_TABLET | Freq: Four times a day (QID) | ORAL | 0 refills | Status: DC

## 2017-06-30 MED ORDER — CEFTRIAXONE SODIUM IN DEXTROSE 20 MG/ML IV SOLN
1.0000 g | Freq: Once | INTRAVENOUS | Status: AC
  Administered 2017-06-30: 1 g via INTRAVENOUS
  Filled 2017-06-30: qty 50

## 2017-06-30 NOTE — ED Triage Notes (Addendum)
Pt reports RUQ abdominal pain for the past 2 days, pt was having nasty tasting burps during the night Thursday and vomited x 1.  Off and on diarrhea x 3 weeks. Pain worse today.  Pt here is with Legal Guardian - sister  Pt has also been on Meloxicam for about 3 weeks as well. Last dose was yesterday.

## 2017-06-30 NOTE — ED Notes (Signed)
Pt states RUQ pain x 2 days. States vomited Thursday. Sister with pt. Sister states intermittent diarrhea x few weeks. Pt taking meloxicam for broken foot. Pt also has a cough. Denies fever at home. Denies pain increasing with food. States constant sharp pain. No distress noted at this time.

## 2017-06-30 NOTE — Discharge Instructions (Signed)
Please seek medical attention for any high fevers, chest pain, shortness of breath, change in behavior, persistent vomiting, bloody stool or any other new or concerning symptoms.  

## 2017-06-30 NOTE — ED Provider Notes (Signed)
[facility] Emergency Department Provider Note   ____________________________________________   I have reviewed the triage vital signs and the nursing notes.   HISTORY  Chief Complaint Abdominal Pain   History limited by developmental delay some history obtained from sister  HPI Emma Fletcher is a 62 y.o. female who presents to the emergency department today because of concern for right upper quadrant pain.   LOCATION:right upper quadrant DURATION:2 days TIMING: constant SEVERITY: severe QUALITY: cramping, burning CONTEXT: patient has strong family history of gallstones. States pain started a couple of days ago. Does not radiate. Has been accompanied by nausea and vomiting. MODIFYING FACTORS: worse with palpation ASSOCIATED SYMPTOMS: nausea and vomiting. No fevers.  Per medical record review patient has a history of developmental delay, DM.   Past Medical History:  Diagnosis Date  . Anemia   . Cardiomyopathy secondary   . Developmental delay   . Diabetes mellitus without complication (HCC)   . Edema   . Hypercholesterolemia   . Unspecified essential hypertension     Patient Active Problem List   Diagnosis Date Noted  . CKD (chronic kidney disease), stage II 05/16/2016  . Hypoglycemia associated with diabetes (HCC) 12/18/2015  . Hyponatremia 12/18/2015  . Acute renal insufficiency 12/18/2015  . Anemia 12/18/2015  . Acute pyelonephritis 12/18/2015  . Hypoglycemia 12/18/2015  . HYPERTENSION, UNSPECIFIED 11/10/2008  . CARDIOMYOPATHY, SECONDARY 11/10/2008  . EDEMA 11/10/2008    Past Surgical History:  Procedure Laterality Date  . none      Prior to Admission medications   Medication Sig Start Date End Date Taking? Authorizing Provider  alum & mag hydroxide-simeth (MAALOX/MYLANTA) 200-200-20 MG/5ML suspension Take 15 mLs by mouth every 4 (four) hours as needed for indigestion or heartburn. Patient not taking: Reported on 12/12/2016  05/16/16   Katharina Caper, MD  amoxicillin (AMOXIL) 500 MG capsule take 1 capsule by mouth three times a day for 10 DAYS 02/20/17   [provider]  budesonide-formoterol (SYMBICORT) 160-4.5 MCG/ACT inhaler Inhale 2 puffs into the lungs 2 (two) times daily.    [provider]  Calcium Carb-Cholecalciferol (CALCIUM 600+D) 600-800 MG-UNIT TABS Take 1 tablet by mouth daily.    [provider]  cetirizine (ZYRTEC) 10 MG tablet Chew 10 mg by mouth daily.    [provider]  Choline Fenofibrate (FENOFIBRIC ACID) 135 MG CPDR Take 135 mg by mouth daily.  07/19/15   [provider]  doxycycline (VIBRA-TABS) 100 MG tablet Take 1 tablet (100 mg total) by mouth 2 (two) times daily. 12/04/16   Helane Gunther, DPM  furosemide (LASIX) 20 MG tablet Take 20 mg by mouth daily.      [provider]  IBU 800 MG tablet take 1 tablet by mouth every 6 to 8 hours if needed for pain 02/20/17   [provider]  Iron, Ferrous Sulfate, 142 (45 Fe) MG TBCR Take 90 mg of iron by mouth daily. 2 a day    [provider]  lansoprazole (PREVACID) 30 MG capsule Take 30 mg by mouth daily.  06/01/15   [provider]  levothyroxine (SYNTHROID, LEVOTHROID) 100 MCG tablet Take 100 mcg by mouth daily.      [provider]  linagliptin (TRADJENTA) 5 MG TABS tablet Take 0.5 tablets (2.5 mg total) by mouth daily. Patient taking differently: Take 5 mg by mouth daily.  05/17/16   Katharina Caper, MD  lisinopril (PRINIVIL,ZESTRIL) 20 MG tablet Take 20 mg by mouth daily.  [provider]  meloxicam (MOBIC) 15 MG tablet Take 1 tablet (15 mg total) by mouth daily. 06/04/17   Helane Gunther, DPM  metFORMIN (GLUCOPHAGE-XR) 500 MG 24 hr tablet Take 1,000 mg by mouth every evening.  11/22/16   [provider]  Multiple Vitamins-Minerals (ONE-A-DAY WOMENS 50 PLUS PO) Take 1 tablet by mouth daily.    [provider]  pioglitazone (ACTOS) 15  MG tablet  12/28/16   [provider]  potassium chloride (K-DUR,KLOR-CON) 10 MEQ tablet Take 10 mEq by mouth daily. 09/09/16   [provider]  SPIRIVA HANDIHALER 18 MCG inhalation capsule Place 18 mcg into inhaler and inhale daily.  07/19/15   [provider]  vitamin C (ASCORBIC ACID) 500 MG tablet Take 500 mg by mouth daily.    [provider]    Allergies Patient has no known allergies.  Family History  Problem Relation Age of Onset  . Diabetes Mother   . Coronary artery disease Mother   . Diabetes Father   . Stroke Father     Social History Social History   Tobacco Use  . Smoking status: Never Smoker  . Smokeless tobacco: Never Used  Substance Use Topics  . Alcohol use: No    Alcohol/week: 0.0 oz  . Drug use: No    Review of Systems Constitutional: No fever/chills Eyes: No visual changes. ENT: No sore throat. Cardiovascular: Denies chest pain. Respiratory: Denies shortness of breath. Gastrointestinal: Positive for abdominal pain. Genitourinary: Negative for dysuria. Musculoskeletal: Negative for back pain. Skin: Negative for rash. Neurological: Negative for headaches, focal weakness or numbness.  ____________________________________________   PHYSICAL EXAM:  VITAL SIGNS: ED Triage Vitals  Enc Vitals Group     BP 06/30/17 1510 (!) 158/54     Pulse Rate 06/30/17 1510 81     Resp 06/30/17 1741 16     Temp 06/30/17 1510 (!) 97.5 F (36.4 C)     Temp Source 06/30/17 1510 Oral     SpO2 06/30/17 1510 100 %     Weight 06/30/17 1510 228 lb (103.4 kg)     Height 06/30/17 1510 5\' 5"  (1.651 m)     Head Circumference --      Peak Flow --      Pain Score 06/30/17 1517 8   Constitutional: Awake and alert. No acute distress.  Eyes: Conjunctivae are normal.  ENT   Head: Normocephalic and atraumatic.   Nose: No congestion/rhinnorhea.   Mouth/Throat: Mucous membranes are moist.   Neck: No  stridor. Hematological/Lymphatic/Immunilogical: No cervical lymphadenopathy. Cardiovascular: Normal rate, regular rhythm.  No murmurs, rubs, or gallops.  Respiratory: Normal respiratory effort without tachypnea nor retractions. Breath sounds are clear and equal bilaterally. No wheezes/rales/rhonchi. Gastrointestinal: Soft. Tender to palpation in the right upper quadrant. No rebound. No guarding.  Genitourinary: Deferred Musculoskeletal: Normal range of motion in all extremities. No lower extremity edema. Neurologic:  Normal speech and language. No gross focal neurologic deficits are appreciated.  Skin:  Skin is warm, dry and intact. No rash noted. Psychiatric: Mood and affect are normal. Speech and behavior are normal. Patient exhibits appropriate insight and judgment.  ____________________________________________    LABS (pertinent positives/negatives)  Lipase 32 CBC wbc 5.4, hgb 10.0, plt 301 CMP na 134, cr 1.28 UA positive nitrite, moderate leukocytes, 6-30 WBCs  ____________________________________________   EKG  None  ____________________________________________    RADIOLOGY  RUQ [place] No cholecystitis. Question polyp vs adherent stone  ____________________________________________   PROCEDURES  Procedures  ____________________________________________   INITIAL IMPRESSION / ASSESSMENT AND PLAN / ED COURSE  Pertinent labs & imaging results that were available during my care of the patient were reviewed by me and considered in my medical decision making (see chart for details).  Patient presented to the emergency department today with primary concerns for abdominal pain.  Differential would be broad and would include pancreatitis, hepatitis, gastritis, duodenitis, gastroenteritis, urinary tract infections among other etiologies.  Patient's workup is consistent with a urinary tract infection.  Ultrasound was performed to evaluate for gallstones.  This did not show any  signs of cholecystitis.  Did show possible polyp versus adherent stone.  Will treat patient for urinary tract infection.  Will patient follow-up with primary care.  Discussed findings and plan with patient and family.   ____________________________________________   FINAL CLINICAL IMPRESSION(S) / ED DIAGNOSES  Final diagnoses:  Epigastric abdominal pain  Abdominal pain, unspecified abdominal location  Lower urinary tract infectious disease     Note: This dictation was prepared with Dragon dictation. Any transcriptional errors that result from this process are unintentional     Phineas Semen, MD 06/30/17 2039

## 2017-07-12 ENCOUNTER — Ambulatory Visit: Payer: Medicare Other | Admitting: Podiatry

## 2017-08-06 ENCOUNTER — Ambulatory Visit: Payer: Self-pay | Admitting: Family Medicine

## 2017-08-12 IMAGING — CR DG CHEST 2V
2 series · 2 of 2 positions shown · non-contrast
Comparison: None.

CLINICAL DATA: Weakness

EXAM:
CHEST  2 VIEW

[chest pa]
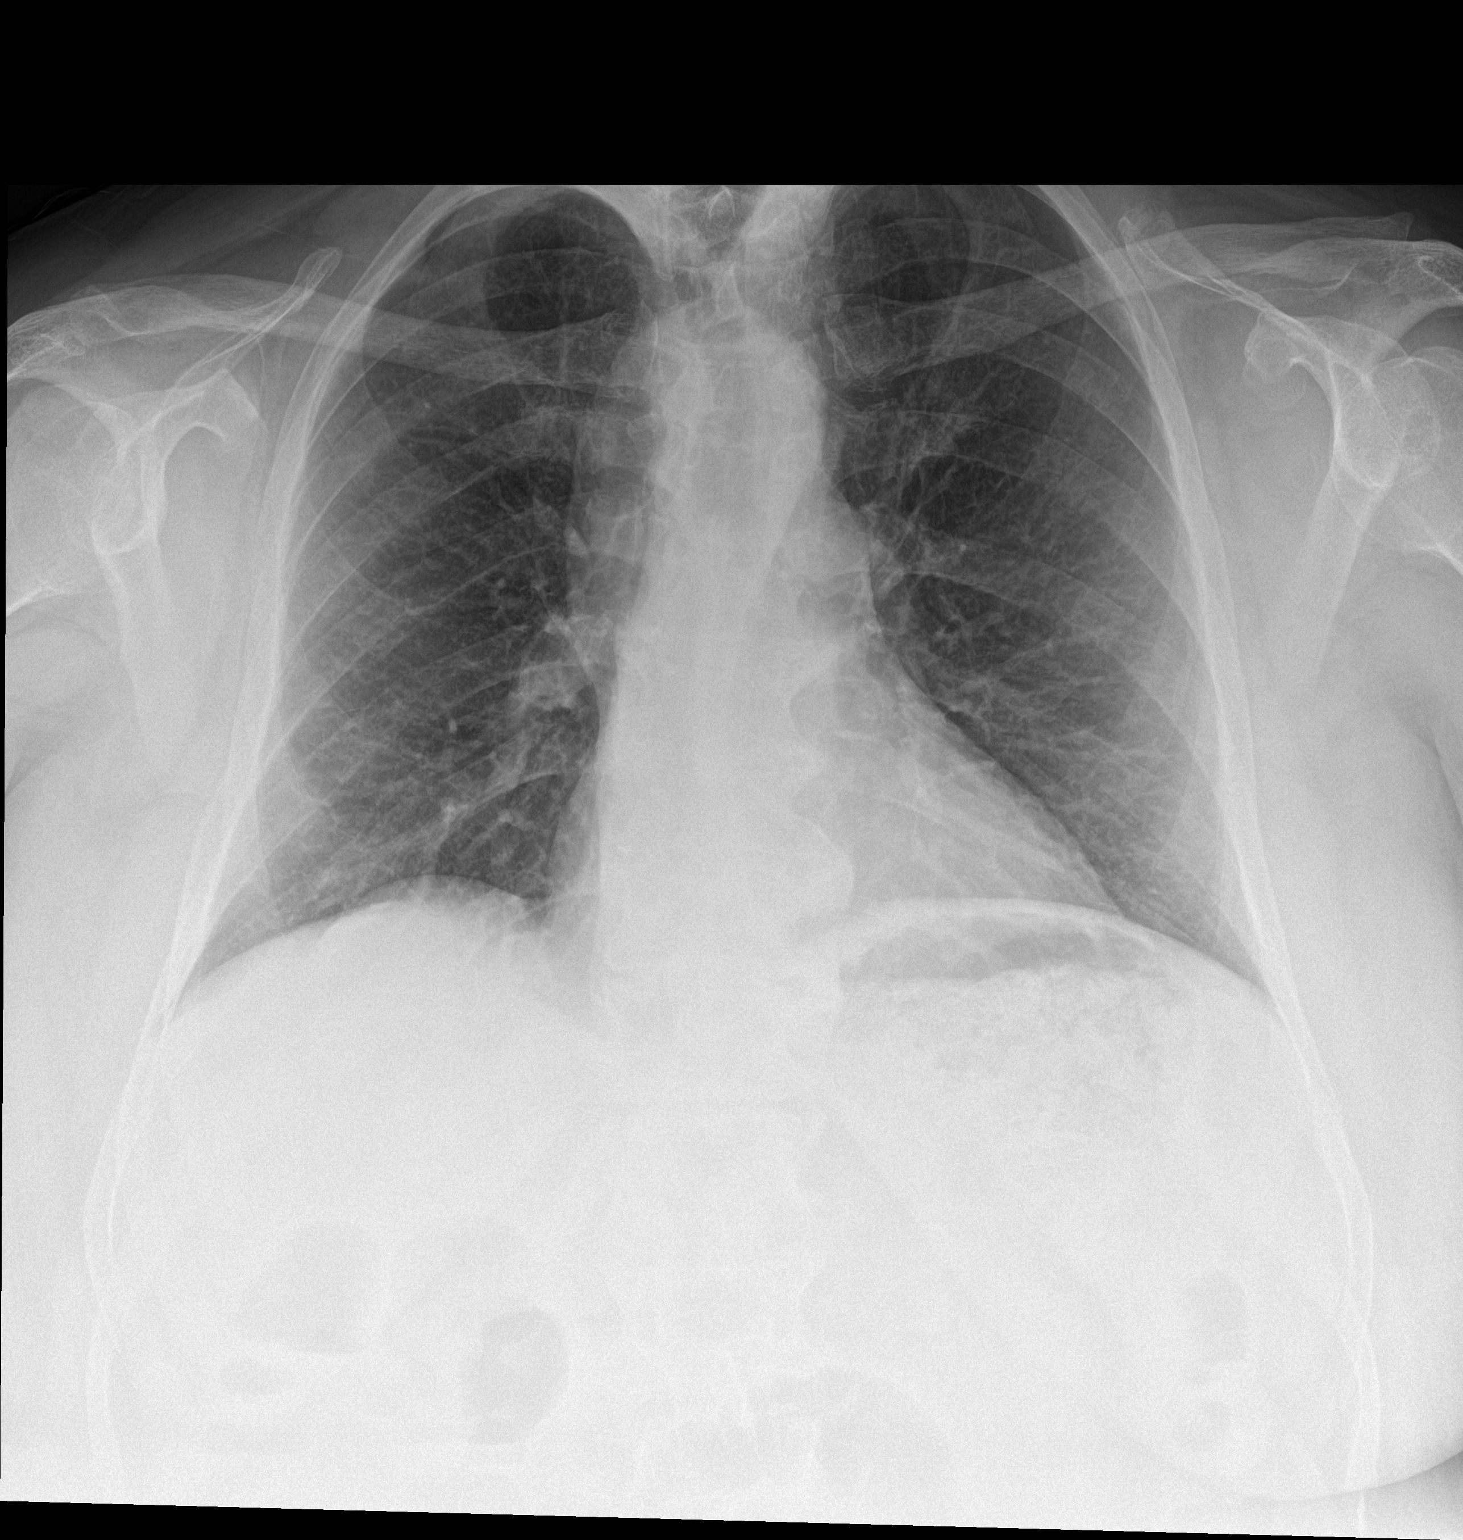

[chest lat]
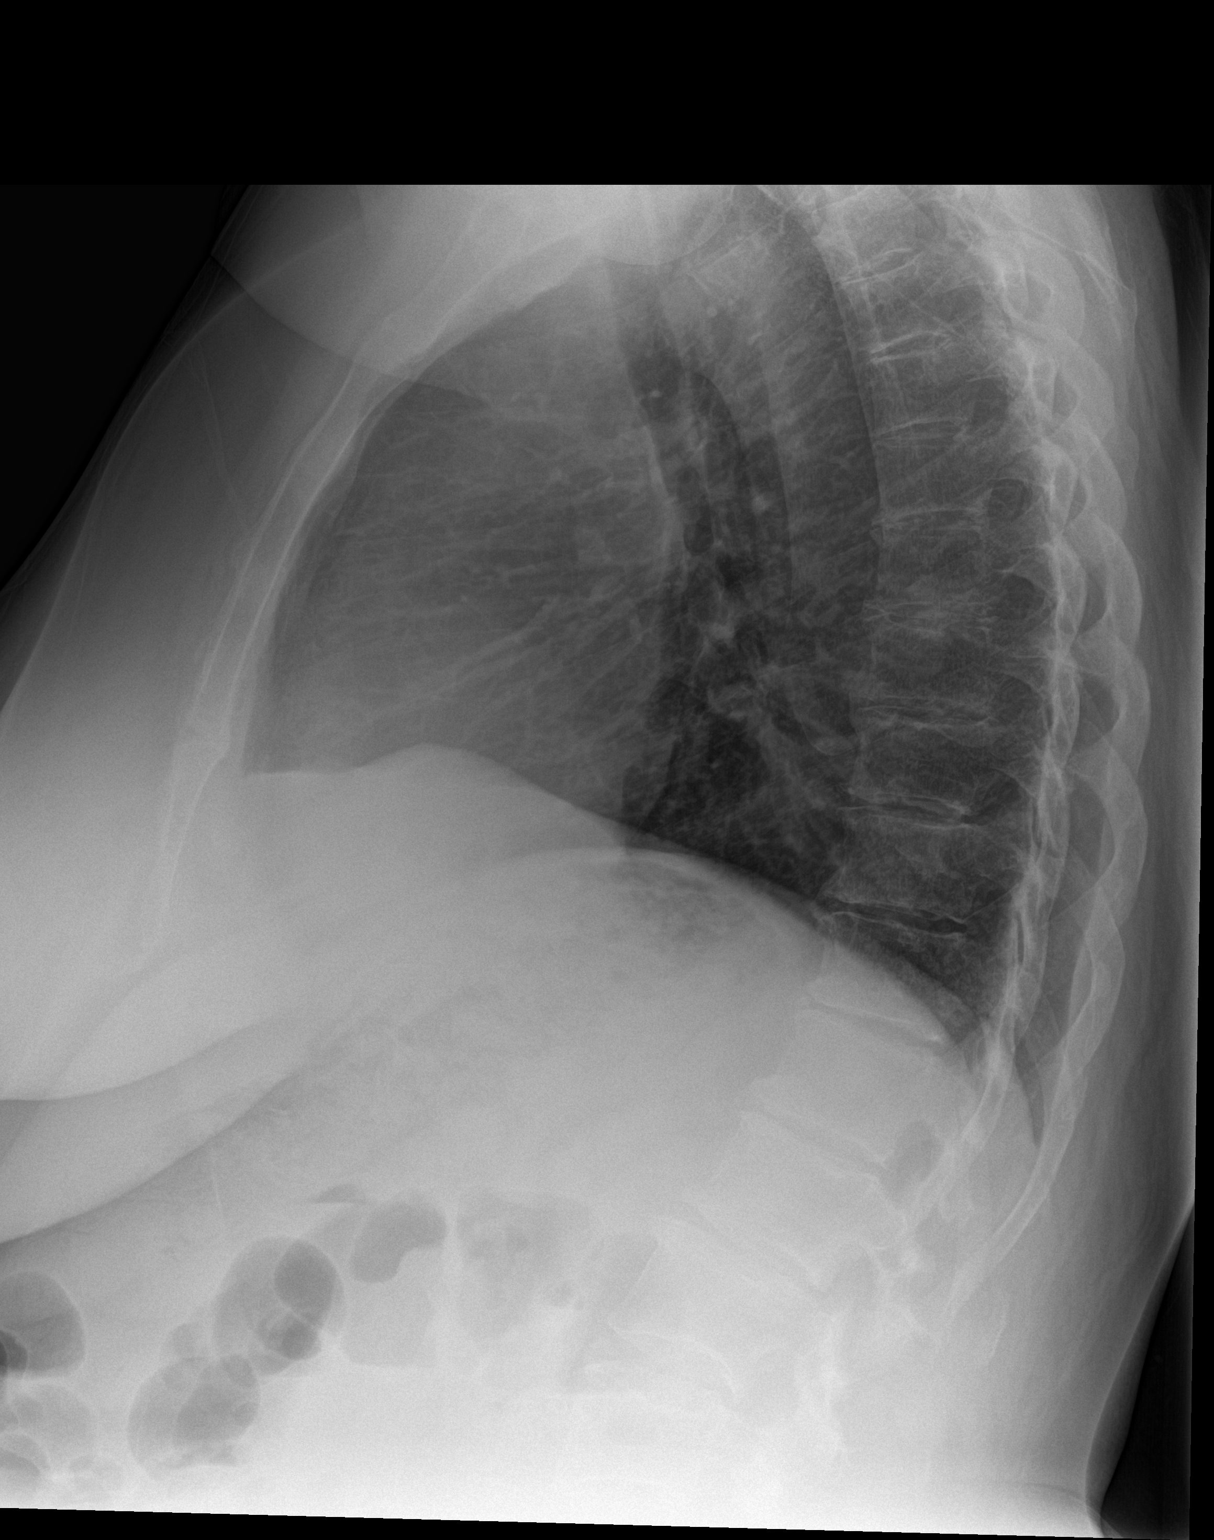

[2 of 2 positions shown; findings below may reference images not displayed]

FINDINGS: Cardiac shadow is within normal limits. The lungs are clear
bilaterally. Degenerative change of thoracic spine is noted.
IMPRESSION: No active cardiopulmonary disease.

## 2017-08-13 ENCOUNTER — Encounter: Payer: Self-pay | Admitting: Podiatry

## 2017-08-13 ENCOUNTER — Ambulatory Visit (INDEPENDENT_AMBULATORY_CARE_PROVIDER_SITE_OTHER): Payer: Medicare Other | Admitting: Podiatry

## 2017-08-13 DIAGNOSIS — S93602A Unspecified sprain of left foot, initial encounter: Secondary | ICD-10-CM

## 2017-08-13 DIAGNOSIS — L600 Ingrowing nail: Secondary | ICD-10-CM

## 2017-08-13 DIAGNOSIS — E119 Type 2 diabetes mellitus without complications: Secondary | ICD-10-CM

## 2017-08-13 NOTE — Progress Notes (Signed)
Complaint:  Visit Type: Patient returns to my office for continued preventative foot care services. Complaint: Patient states" my nails have grown long and thick and become painful to walk and wear shoes" Patient has been diagnosed with DM with no foot complications. The patient presents for preventative foot care services. No changes to ROS  Podiatric Exam: Vascular: dorsalis pedis and posterior tibial pulses are palpable bilateral. Capillary return is immediate. Temperature gradient is WNL. Skin turgor WNL  Sensorium: Normal Semmes Weinstein monofilament test. Normal tactile sensation bilaterally. Nail Exam: Pt has elongated ingrown toenail  B/L Ulcer Exam: There is no evidence of ulcer or pre-ulcerative changes or infection. Orthopedic Exam: Muscle tone and strength are WNL. No limitations in general ROM. No crepitus or effusions noted. Foot type and digits show no abnormalities. Bony prominences are unremarkable. Skin: No Porokeratosis. No infection or ulcers  Diagnosis:  Nail Dystrophy , Pain in right toe, pain in left toes  Treatment & Plan Procedures and Treatment: Consent by patient was obtained for treatment procedures.   Debridement  hypertrophic toenails, 1 through 5 bilateral and clearing of subungual debris. No ulceration, no infection noted.  Return Visit-Office Procedure: Patient instructed to return to the office for a follow up visit prn for continued evaluation and treatment.    Helane Gunther DPM

## 2017-09-17 ENCOUNTER — Encounter: Payer: Self-pay | Admitting: Podiatry

## 2017-09-17 ENCOUNTER — Ambulatory Visit (INDEPENDENT_AMBULATORY_CARE_PROVIDER_SITE_OTHER): Payer: Medicare Other | Admitting: Podiatry

## 2017-09-17 DIAGNOSIS — E119 Type 2 diabetes mellitus without complications: Secondary | ICD-10-CM

## 2017-09-17 DIAGNOSIS — L600 Ingrowing nail: Secondary | ICD-10-CM

## 2017-09-17 NOTE — Progress Notes (Signed)
Complaint:  Visit Type: Patient returns to my office for continued preventative foot care services. Complaint: Patient states" she has an ingrown toenail outside border right big toe.  It is painful walking and wearing her shoes.  Podiatric Exam: Vascular: dorsalis pedis and posterior tibial pulses are palpable bilateral. Capillary return is immediate. Temperature gradient is WNL. Skin turgor WNL  Sensorium: Normal Semmes Weinstein monofilament test. Normal tactile sensation bilaterally. Nail Exam: Pt has elongated ingrown toenail  B/L Ulcer Exam: There is no evidence of ulcer or pre-ulcerative changes or infection. Orthopedic Exam: Muscle tone and strength are WNL. No limitations in general ROM. No crepitus or effusions noted. Foot type and digits show no abnormalities. Bony prominences are unremarkable. Skin: No Porokeratosis. No infection or ulcers  Diagnosis:  Nail Dystrophy , Pain in right toe, pain in left toes  Treatment & Plan Procedures and Treatment: Consent by patient was obtained for treatment procedures.   Debridement  hypertrophic toenails, 1 through 5 bilateral and clearing of subungual debris. No ulceration, no infection noted. No charge for this visit, Return Visit-Office Procedure: Patient instructed to return to the office for a follow up visit prn for continued evaluation and treatment.    Helane Gunther DPM

## 2017-09-20 ENCOUNTER — Encounter: Payer: Self-pay | Admitting: Family Medicine

## 2017-09-20 ENCOUNTER — Ambulatory Visit (INDEPENDENT_AMBULATORY_CARE_PROVIDER_SITE_OTHER): Payer: Medicare Other | Admitting: Family Medicine

## 2017-09-20 VITALS — BP 120/72 | HR 69 | Temp 97.8°F | Resp 16 | Ht 66.0 in | Wt 236.0 lb

## 2017-09-20 DIAGNOSIS — E785 Hyperlipidemia, unspecified: Secondary | ICD-10-CM | POA: Insufficient documentation

## 2017-09-20 DIAGNOSIS — Z114 Encounter for screening for human immunodeficiency virus [HIV]: Secondary | ICD-10-CM | POA: Diagnosis not present

## 2017-09-20 DIAGNOSIS — N182 Chronic kidney disease, stage 2 (mild): Secondary | ICD-10-CM

## 2017-09-20 DIAGNOSIS — D649 Anemia, unspecified: Secondary | ICD-10-CM

## 2017-09-20 DIAGNOSIS — I429 Cardiomyopathy, unspecified: Secondary | ICD-10-CM | POA: Diagnosis not present

## 2017-09-20 DIAGNOSIS — E119 Type 2 diabetes mellitus without complications: Secondary | ICD-10-CM

## 2017-09-20 DIAGNOSIS — R600 Localized edema: Secondary | ICD-10-CM | POA: Diagnosis not present

## 2017-09-20 DIAGNOSIS — H6983 Other specified disorders of Eustachian tube, bilateral: Secondary | ICD-10-CM | POA: Insufficient documentation

## 2017-09-20 DIAGNOSIS — I10 Essential (primary) hypertension: Secondary | ICD-10-CM

## 2017-09-20 DIAGNOSIS — Z1159 Encounter for screening for other viral diseases: Secondary | ICD-10-CM | POA: Diagnosis not present

## 2017-09-20 DIAGNOSIS — E039 Hypothyroidism, unspecified: Secondary | ICD-10-CM | POA: Diagnosis not present

## 2017-09-20 DIAGNOSIS — E1169 Type 2 diabetes mellitus with other specified complication: Secondary | ICD-10-CM | POA: Insufficient documentation

## 2017-09-20 DIAGNOSIS — E871 Hypo-osmolality and hyponatremia: Secondary | ICD-10-CM

## 2017-09-20 MED ORDER — FLUTICASONE PROPIONATE 50 MCG/ACT NA SUSP: 2.0000 | Freq: Every day | NASAL | 6 refills | Status: DC

## 2017-09-20 NOTE — Assessment & Plan Note (Signed)
Noted in the patient's chart, likely secondary to hypertension Discussed importance of good hypertensive control

## 2017-09-20 NOTE — Assessment & Plan Note (Signed)
We will recheck kidney function today after recent episode of pyelonephritis and AKI Discussed importance of good control of hypertension and diabetes

## 2017-09-20 NOTE — Progress Notes (Signed)
Patient: Emma Fletcher, Female    DOB: 1955/10/31, 62 y.o.   MRN: [id number] Visit Date: 09/20/2017  Today's Provider: Shirlee Latch, MD   I, Joslyn Hy, CMA, am acting as scribe for Shirlee Latch, MD.  Chief Complaint  Patient presents with  . Establish Care   Subjective:    Establish Care Emma Fletcher is a 62 y.o. female who presents today to establish care. She is accompanied by her sister, who is her HCPOA. Pt is mentally delayed, so sister is providing most of the history. She was previously seeing [facility], but her provider no longer works there. That practice has also stopped taking Medicaid. She feels well. She reports exercising. She rides her stationary bicycle, and walks. She reports she is sleeping well.  Pt's PMH includes diabetes with hypoglycemia, anemia, HTN, CKD II, and cardiomyopathy.  Sister [facility]) is concerned about pt's hearing. This has been worsening for about 1 month. Her ears "pop" when she sneezes or yawns.  Has always has trouble with hearing, but it seems somewhat worse.  HTN:  Taking lisinopril 20mg  daily with good compliance. Also takes lasix 20mg  daily for LE edema (no mention of CHF).  She takes K supplement.  She is not checking her blood pressure at home. Denies any SOB, CP, vision changes, medication SEs, or symptoms of hypotension  HLD: Taking fenofibrate's.  She is never taken a statin.  She reports good compliance.  She denies any side effects.  There is also mention of secondary cardiomyopathy in her chart.  This is likely hypertensive cardiomyopathy.  T2DM: Patient is taking her blood sugar 2-3 times daily at home.  She has been in the emergency room a few times in the past for hypoglycemia.  Her lows seem to be in the 60s.  Her typical fasting blood sugar is somewhere in the 70s-120s.  Her evening blood sugars are 110-150.  Taking metformin XR 1000 mg daily she does eat high carb meals and skips meals  sometimes.  Sister believes that this is causing her hypoglycemia.  She denies polyuria, polydipsia, numbness of her extremities, foot ulcer/trauma.  She is followed by podiatry.  She is planning a follow-up visit with her ophthalmologist at [facility] for her next diabetic eye exam.  Anemia: Microcytic, chronic.  Seeing Dr Smith Robert ([place]).  She is taking a daily iron supplement.  Based on last hematology note, it is unclear of the etiology of her anemia they plan to see her every 6 months to continue monitoring this.  Hypothyroidism: Taking Synthroid 100 mcg daily with good compliance.  Denies any symptoms such as dry skin/hair, cold/heat intolerance, weight loss/gain.  GERD: Taking Prevacid daily.  Reports this controls her symptoms well.  CKD 2: She was recently seen in the emergency department in 06/2017 for pyelonephritis and AK I.  She was also noted to chronic hyponatremia.  She was referred to [facility].  When seen there reports a workup was completed and she was told that her hyponatremia was not related to her kidneys, but rather that she was drinking too much water.  She denies any abdominal pain, dysuria, urinary frequency/urgency, fevers. -----------------------------------------------------------------   Review of Systems  Constitutional: Negative.   HENT: Positive for hearing loss. Negative for congestion, dental problem, drooling, ear discharge, ear pain, facial swelling, mouth sores, nosebleeds, postnasal drip, rhinorrhea, sinus pressure, sinus pain, sneezing, sore throat, tinnitus, trouble swallowing and voice change.   Eyes: Negative.  Respiratory: Negative.   Cardiovascular: Positive for leg swelling. Negative for chest pain and palpitations.  Gastrointestinal: Negative.   Endocrine: Negative.   Genitourinary: Negative.   Musculoskeletal: Negative.   Skin: Negative.   Allergic/Immunologic: Negative.   Neurological: Negative.   Hematological: Negative.     Psychiatric/Behavioral: Negative.     Social History      She  reports that she has never smoked. She has never used smokeless tobacco. She reports that she does not drink alcohol or use drugs.       Social History   Socioeconomic History  . Marital status: Single    Spouse name: Not on file  . Number of children: 0  . Years of education: Not on file  . Highest education level: Not on file  Occupational History  . Occupation: Part time works in [employer]  . Financial resource strain: Not on file  . Food insecurity:    Worry: Not on file    Inability: Not on file  . Transportation needs:    Medical: Not on file    Non-medical: Not on file  Tobacco Use  . Smoking status: Never Smoker  . Smokeless tobacco: Never Used  Substance and Sexual Activity  . Alcohol use: No    Alcohol/week: 0.0 oz  . Drug use: No  . Sexual activity: Not on file  Lifestyle  . Physical activity:    Days per week: Not on file    Minutes per session: Not on file  . Stress: Not on file  Relationships  . Social connections:    Talks on phone: Not on file    Gets together: Not on file    Attends religious service: Not on file    Active member of club or organization: Not on file    Attends meetings of clubs or organizations: Not on file    Relationship status: Not on file  Other Topics Concern  . Not on file  Social History Narrative  . Not on file    Past Medical History:  Diagnosis Date  . Anemia   . Cardiomyopathy secondary   . Developmental delay   . Diabetes mellitus without complication (HCC)   . Edema   . Hypercholesterolemia   . Unspecified essential hypertension      Patient Active Problem List   Diagnosis Date Noted  . HLD (hyperlipidemia) 09/20/2017  . Hypothyroidism 09/20/2017  . Dysfunction of both eustachian tubes 09/20/2017  . CKD (chronic kidney disease), stage II 05/16/2016  . T2DM (type 2 diabetes mellitus) (HCC) 12/18/2015  . Hyponatremia  12/18/2015  . Anemia 12/18/2015  . Hypoglycemia 12/18/2015  . Essential hypertension 11/10/2008  . Secondary cardiomyopathy (HCC) 11/10/2008  . EDEMA 11/10/2008    Past Surgical History:  Procedure Laterality Date  . none      Family History        Family Status  Relation Name Status  . Mother  Deceased at age 49  . Father  Deceased at age 70  . Mat Aunt  (Not Specified)  . Mat Uncle  (Not Specified)  . Oneal Grout  (Not Specified)  . Other  (Not Specified)        Her family history includes Breast cancer in her maternal aunt; Coronary artery disease in her father; Diabetes in her father and mother; Hypertension in her mother; Lung cancer in her maternal uncle; Prostate cancer in her paternal uncle; Stroke in her mother; Uterine cancer in her other.  No Known Allergies   Current Outpatient Medications:  .  budesonide-formoterol (SYMBICORT) 160-4.5 MCG/ACT inhaler, Inhale 2 puffs into the lungs 2 (two) times daily., Disp: , Rfl:  .  Cholecalciferol (VITAMIN D3) 5000 units CAPS, Take by mouth., Disp: , Rfl:  .  Choline Fenofibrate (FENOFIBRIC ACID) 135 MG CPDR, Take 135 mg by mouth daily. , Disp: , Rfl:  .  furosemide (LASIX) 20 MG tablet, Take 20 mg by mouth daily.  , Disp: , Rfl:  .  Iron, Ferrous Sulfate, 142 (45 Fe) MG TBCR, Take 90 mg of iron by mouth daily. 2 a day, Disp: , Rfl:  .  lansoprazole (PREVACID) 30 MG capsule, Take 30 mg by mouth daily. , Disp: , Rfl:  .  levothyroxine (SYNTHROID, LEVOTHROID) 100 MCG tablet, Take 100 mcg by mouth daily.  , Disp: , Rfl:  .  lisinopril (PRINIVIL,ZESTRIL) 20 MG tablet, Take 20 mg by mouth daily.  , Disp: , Rfl:  .  metFORMIN (GLUCOPHAGE-XR) 500 MG 24 hr tablet, Take 1,000 mg by mouth every evening. , Disp: , Rfl: 0 .  Multiple Vitamins-Minerals (ONE-A-DAY WOMENS 50 PLUS PO), Take 1 tablet by mouth daily., Disp: , Rfl:  .  potassium chloride (K-DUR,KLOR-CON) 10 MEQ tablet, Take 10 mEq by mouth daily., Disp: , Rfl:  .  SPIRIVA  HANDIHALER 18 MCG inhalation capsule, Place 18 mcg into inhaler and inhale daily. , Disp: , Rfl:  .  vitamin C (ASCORBIC ACID) 500 MG tablet, Take 500 mg by mouth daily., Disp: , Rfl:  .  fluticasone (FLONASE) 50 MCG/ACT nasal spray, Place 2 sprays into both nostrils daily., Disp: 16 g, Rfl: 6   Patient Care Team: Patient, No Pcp Per as PCP - General (General Practice)      Objective:   Vitals: BP 120/72 (BP Location: Left Arm, Patient Position: Sitting, Cuff Size: Large)   Pulse 69   Temp 97.8 F (36.6 C) (Oral)   Resp 16   Ht 5\' 6"  (1.676 m)   Wt 236 lb (107 kg)   LMP  (LMP Unknown)   SpO2 99%   BMI 38.09 kg/m    Vitals:   09/20/17 1358  BP: 120/72  Pulse: 69  Resp: 16  Temp: 97.8 F (36.6 C)  TempSrc: Oral  SpO2: 99%  Weight: 236 lb (107 kg)  Height: 5\' 6"  (1.676 m)     Physical Exam  Constitutional: She is oriented to person, place, and time. She appears well-developed and well-nourished. No distress.  HENT:  Head: Normocephalic and atraumatic.  Right Ear: Tympanic membrane, external ear and ear canal normal.  Left Ear: Tympanic membrane, external ear and ear canal normal.  Nose: Nose normal. No mucosal edema. Right sinus exhibits no maxillary sinus tenderness and no frontal sinus tenderness. Left sinus exhibits no maxillary sinus tenderness and no frontal sinus tenderness.  Mouth/Throat: Uvula is midline, oropharynx is clear and moist and mucous membranes are normal.  Eyes: Pupils are equal, round, and reactive to light. Conjunctivae and EOM are normal. No scleral icterus.  Neck: Neck supple. No thyromegaly present.  Cardiovascular: Normal rate, regular rhythm, normal heart sounds and intact distal pulses.  No murmur heard. Pulmonary/Chest: Breath sounds normal. No respiratory distress. She has no wheezes. She has no rales.  Abdominal: Soft. Bowel sounds are normal. She exhibits no distension. There is no tenderness. There is no rebound and no guarding.    Musculoskeletal: She exhibits edema. She exhibits no deformity.  Lymphadenopathy:  She has no cervical adenopathy.  Neurological: She is alert and oriented to person, place, and time.  Skin: Skin is warm and dry. No rash noted.  Psychiatric: She has a normal mood and affect. Her behavior is normal.  Vitals reviewed.    Depression Screen PHQ 2/9 Scores 09/20/2017  PHQ - 2 Score 0     Assessment & Plan:    Problem List Items Addressed This Visit      Cardiovascular and Mediastinum   Essential hypertension    Well-controlled currently Continue lisinopril  check BMP Follow-up in 6 months      Relevant Orders   Comprehensive metabolic panel   Secondary cardiomyopathy (HCC)    Noted in the patient's chart, likely secondary to hypertension Discussed importance of good hypertensive control        Endocrine   T2DM (type 2 diabetes mellitus) (HCC)    Blood sugar log shows fairly good control Patient does have some episodes of hypoglycemia that are likely induced by long fast after high carbohydrate meals Discussed diabetic diet in depth Discussed importance of exercise Last A1c unknown - check today Continue metformin at current dose Pending A1c, consider dose titration versus possible addition of another medication Advised her to get an eye exam      Relevant Orders   Hemoglobin A1c   Hypothyroidism    Asymptomatic Unknown what last TSH was Continue Synthroid at current dose Check TSH today and dose adjust as indicated      Relevant Orders   TSH     Nervous and Auditory   Dysfunction of both eustachian tubes    Symptoms suggest eustachian tube dysfunction likely from sinus congestion and allergies Trial of Flonase If hearing does not improve, we will refer to audiology for further evaluation        Genitourinary   CKD (chronic kidney disease), stage II    We will recheck kidney function today after recent episode of pyelonephritis and AKI Discussed  importance of good control of hypertension and diabetes      Relevant Orders   Comprehensive metabolic panel     Other   EDEMA    Fairly well controlled Discussed the chronic nature of this and the importance of weight loss, exercise, elevation, compression stockings to help I do not typically treat for lower extremity edema with loop diuretics, patient is doing well on her current dose      Hyponatremia - Primary    Recheck electrolytes today Continue to monitor Discussed only drinking when she is thirsty and not forcing herself to drink gallons of water per day      Relevant Orders   Comprehensive metabolic panel   Anemia    Seems to be chronic and of uncertain etiology Continue to follow with hematology Recheck CBC today      Relevant Orders   CBC   HLD (hyperlipidemia)    Not currently taking a statin Recheck lipid panel today Calculate ASCVD risk and consider whether statin therapy would be indicated      Relevant Orders   Lipid panel    Other Visit Diagnoses    Need for hepatitis C screening test       Relevant Orders   Hepatitis C Antibody   Screening for HIV (human immunodeficiency virus)       Relevant Orders   HIV antibody (with reflex)       Return in about 6 months (around 03/22/2018) for AWV.   The entirety of  the information documented in the History of Present Illness, Review of Systems and Physical Exam were personally obtained by me. Portions of this information were initially documented by Irving Burton Ratchford, CMA and reviewed by me for thoroughness and accuracy.    Erasmo Downer, MD, MPH [facility] 09/20/2017 3:22 PM

## 2017-09-20 NOTE — Assessment & Plan Note (Signed)
Seems to be chronic and of uncertain etiology Continue to follow with hematology Recheck CBC today

## 2017-09-20 NOTE — Assessment & Plan Note (Signed)
Not currently taking a statin Recheck lipid panel today Calculate ASCVD risk and consider whether statin therapy would be indicated

## 2017-09-20 NOTE — Assessment & Plan Note (Signed)
Blood sugar log shows fairly good control Patient does have some episodes of hypoglycemia that are likely induced by long fast after high carbohydrate meals Discussed diabetic diet in depth Discussed importance of exercise Last A1c unknown - check today Continue metformin at current dose Pending A1c, consider dose titration versus possible addition of another medication Advised her to get an eye exam

## 2017-09-20 NOTE — Assessment & Plan Note (Signed)
Fairly well controlled Discussed the chronic nature of this and the importance of weight loss, exercise, elevation, compression stockings to help I do not typically treat for lower extremity edema with loop diuretics, patient is doing well on her current dose

## 2017-09-20 NOTE — Assessment & Plan Note (Signed)
Symptoms suggest eustachian tube dysfunction likely from sinus congestion and allergies Trial of Flonase If hearing does not improve, we will refer to audiology for further evaluation

## 2017-09-20 NOTE — Assessment & Plan Note (Signed)
Well-controlled currently Continue lisinopril  check BMP Follow-up in 6 months

## 2017-09-20 NOTE — Patient Instructions (Signed)

## 2017-09-20 NOTE — Assessment & Plan Note (Signed)
Asymptomatic Unknown what last TSH was Continue Synthroid at current dose Check TSH today and dose adjust as indicated

## 2017-09-20 NOTE — Assessment & Plan Note (Signed)
Recheck electrolytes today Continue to monitor Discussed only drinking when she is thirsty and not forcing herself to drink gallons of water per day

## 2017-09-25 DIAGNOSIS — N182 Chronic kidney disease, stage 2 (mild): Secondary | ICD-10-CM | POA: Diagnosis not present

## 2017-09-25 DIAGNOSIS — E039 Hypothyroidism, unspecified: Secondary | ICD-10-CM | POA: Diagnosis not present

## 2017-09-25 DIAGNOSIS — E785 Hyperlipidemia, unspecified: Secondary | ICD-10-CM | POA: Diagnosis not present

## 2017-09-25 DIAGNOSIS — Z114 Encounter for screening for human immunodeficiency virus [HIV]: Secondary | ICD-10-CM | POA: Diagnosis not present

## 2017-09-25 DIAGNOSIS — E871 Hypo-osmolality and hyponatremia: Secondary | ICD-10-CM | POA: Diagnosis not present

## 2017-09-25 DIAGNOSIS — D649 Anemia, unspecified: Secondary | ICD-10-CM | POA: Diagnosis not present

## 2017-09-25 DIAGNOSIS — I10 Essential (primary) hypertension: Secondary | ICD-10-CM | POA: Diagnosis not present

## 2017-09-25 DIAGNOSIS — Z1159 Encounter for screening for other viral diseases: Secondary | ICD-10-CM | POA: Diagnosis not present

## 2017-09-25 DIAGNOSIS — E119 Type 2 diabetes mellitus without complications: Secondary | ICD-10-CM | POA: Diagnosis not present

## 2017-09-26 ENCOUNTER — Telehealth: Payer: Self-pay

## 2017-09-26 LAB — COMPREHENSIVE METABOLIC PANEL
A/G RATIO: 1.8 (ref 1.2–2.2)
ALT: 15 IU/L (ref 0–32)
AST: 16 IU/L (ref 0–40)
Albumin: 4.2 g/dL (ref 3.6–4.8)
Alkaline Phosphatase: 70 IU/L (ref 39–117)
BUN/Creatinine Ratio: 20 (ref 12–28)
BUN: 21 mg/dL (ref 8–27)
CALCIUM: 9.8 mg/dL (ref 8.7–10.3)
CHLORIDE: 101 mmol/L (ref 96–106)
CO2: 23 mmol/L (ref 20–29)
Creatinine, Ser: 1.06 mg/dL — ABNORMAL HIGH (ref 0.57–1.00)
GFR calc non Af Amer: 57 mL/min/{1.73_m2} — ABNORMAL LOW (ref >59)
GFR, EST AFRICAN AMERICAN: 66 mL/min/{1.73_m2} (ref >59)
GLOBULIN, TOTAL: 2.4 g/dL (ref 1.5–4.5)
Glucose: 140 mg/dL — ABNORMAL HIGH (ref 65–99)
POTASSIUM: 4.9 mmol/L (ref 3.5–5.2)
SODIUM: 137 mmol/L (ref 134–144)
Total Protein: 6.6 g/dL (ref 6.0–8.5)

## 2017-09-26 LAB — CBC
Hematocrit: 28.9 % — ABNORMAL LOW (ref 34.0–46.6)
Hemoglobin: 9.2 g/dL — ABNORMAL LOW (ref 11.1–15.9)
MCH: 28.6 pg (ref 26.6–33.0)
MCHC: 31.8 g/dL (ref 31.5–35.7)
MCV: 90 fL (ref 79–97)
PLATELETS: 364 10*3/uL (ref 150–379)
RBC: 3.22 x10E6/uL — AB (ref 3.77–5.28)
RDW: 13 % (ref 12.3–15.4)
WBC: 5.6 10*3/uL (ref 3.4–10.8)

## 2017-09-26 LAB — LIPID PANEL
CHOLESTEROL TOTAL: 173 mg/dL (ref 100–199)
Chol/HDL Ratio: 2.4 ratio (ref 0.0–4.4)
HDL: 73 mg/dL (ref >39)
LDL CALC: 89 mg/dL (ref 0–99)
TRIGLYCERIDES: 55 mg/dL (ref 0–149)
VLDL CHOLESTEROL CAL: 11 mg/dL (ref 5–40)

## 2017-09-26 LAB — HEMOGLOBIN A1C
Est. average glucose Bld gHb Est-mCnc: 140 mg/dL
HEMOGLOBIN A1C: 6.5 % — AB (ref 4.8–5.6)

## 2017-09-26 LAB — HIV ANTIBODY (ROUTINE TESTING W REFLEX): HIV Screen 4th Generation wRfx: NONREACTIVE

## 2017-09-26 LAB — HEPATITIS C ANTIBODY: Hep C Virus Ab: <0.1 s/co ratio (ref 0.0–0.9)

## 2017-09-26 LAB — TSH: TSH: 1.4 u[IU]/mL (ref 0.450–4.500)

## 2017-09-26 NOTE — Telephone Encounter (Signed)
Pt's sister advised

## 2017-09-26 NOTE — Telephone Encounter (Signed)
-----   Message from Erasmo Downer, MD sent at 09/26/2017  8:49 AM EDT ----- Normal Thyroid function, liver function, electrolytes, cholesterol.  Hemoglobin is slightly decreased from last check.  This is followed by hematology.  Kidney function is slightly improved.   Negative HIV and Hep C screens.  Hemoglobin A1c is well controlled at 6.5.  Continue Metformin and low carb diet.  Erasmo Downer, MD, MPH [facility] 09/26/2017 8:49 AM

## 2017-10-01 ENCOUNTER — Telehealth: Payer: Self-pay

## 2017-10-01 NOTE — Telephone Encounter (Signed)
NOTES ON FILE IN CHART PREP SENT NOTES TO SCHEDULING

## 2017-10-04 ENCOUNTER — Other Ambulatory Visit: Payer: Self-pay | Admitting: Family Medicine

## 2017-10-04 NOTE — Telephone Encounter (Signed)
Pt's sister is calling requesting a refill of Pioglitazone 15 MG and Levothyroxine 100 MG sent to [facility] on [address]

## 2017-10-05 NOTE — Telephone Encounter (Signed)
Pioglitazone not on med list, and looks like it has not been prescribed since 2017. LMTCB to verify she still takes this.

## 2017-10-08 MED ORDER — LEVOTHYROXINE SODIUM 100 MCG PO TABS: 100.0000 ug | ORAL_TABLET | Freq: Every day | ORAL | 6 refills | Status: DC

## 2017-10-08 NOTE — Telephone Encounter (Signed)
Pt's sister [name] called back from Friday.  Her call phone  [phone]  Thanks Barth Kirks

## 2017-10-08 NOTE — Telephone Encounter (Signed)
June states she was not sure if pt was still taking pioglitazone, and agrees to only fill levothyroxine.

## 2017-10-19 ENCOUNTER — Inpatient Hospital Stay (HOSPITAL_BASED_OUTPATIENT_CLINIC_OR_DEPARTMENT_OTHER): Payer: Medicare Other | Admitting: Oncology

## 2017-10-19 ENCOUNTER — Encounter: Payer: Self-pay | Admitting: Oncology

## 2017-10-19 ENCOUNTER — Inpatient Hospital Stay: Payer: Medicare Other | Attending: Oncology

## 2017-10-19 VITALS — BP 120/77 | HR 67 | Temp 97.5°F | Resp 18 | Ht 66.0 in | Wt 239.0 lb

## 2017-10-19 DIAGNOSIS — Z8042 Family history of malignant neoplasm of prostate: Secondary | ICD-10-CM

## 2017-10-19 DIAGNOSIS — E7800 Pure hypercholesterolemia, unspecified: Secondary | ICD-10-CM

## 2017-10-19 DIAGNOSIS — Z801 Family history of malignant neoplasm of trachea, bronchus and lung: Secondary | ICD-10-CM

## 2017-10-19 DIAGNOSIS — N189 Chronic kidney disease, unspecified: Secondary | ICD-10-CM | POA: Diagnosis not present

## 2017-10-19 DIAGNOSIS — E039 Hypothyroidism, unspecified: Secondary | ICD-10-CM | POA: Diagnosis not present

## 2017-10-19 DIAGNOSIS — E669 Obesity, unspecified: Secondary | ICD-10-CM | POA: Insufficient documentation

## 2017-10-19 DIAGNOSIS — E119 Type 2 diabetes mellitus without complications: Secondary | ICD-10-CM | POA: Diagnosis not present

## 2017-10-19 DIAGNOSIS — I429 Cardiomyopathy, unspecified: Secondary | ICD-10-CM | POA: Diagnosis not present

## 2017-10-19 DIAGNOSIS — Z79899 Other long term (current) drug therapy: Secondary | ICD-10-CM | POA: Diagnosis not present

## 2017-10-19 DIAGNOSIS — Z803 Family history of malignant neoplasm of breast: Secondary | ICD-10-CM | POA: Insufficient documentation

## 2017-10-19 DIAGNOSIS — J449 Chronic obstructive pulmonary disease, unspecified: Secondary | ICD-10-CM | POA: Diagnosis not present

## 2017-10-19 DIAGNOSIS — I10 Essential (primary) hypertension: Secondary | ICD-10-CM

## 2017-10-19 DIAGNOSIS — G4730 Sleep apnea, unspecified: Secondary | ICD-10-CM | POA: Diagnosis not present

## 2017-10-19 DIAGNOSIS — D649 Anemia, unspecified: Secondary | ICD-10-CM

## 2017-10-19 DIAGNOSIS — I129 Hypertensive chronic kidney disease with stage 1 through stage 4 chronic kidney disease, or unspecified chronic kidney disease: Secondary | ICD-10-CM

## 2017-10-19 LAB — CBC WITH DIFFERENTIAL/PLATELET
BASOS ABS: 0 10*3/uL (ref 0–0.1)
BASOS PCT: 1 %
Eosinophils Absolute: 0.1 10*3/uL (ref 0–0.7)
Eosinophils Relative: 1 %
HEMATOCRIT: 29.1 % — AB (ref 35.0–47.0)
Hemoglobin: 10.1 g/dL — ABNORMAL LOW (ref 12.0–16.0)
Lymphocytes Relative: 17 %
Lymphs Abs: 1.1 10*3/uL (ref 1.0–3.6)
MCH: 30.5 pg (ref 26.0–34.0)
MCHC: 34.6 g/dL (ref 32.0–36.0)
MCV: 88 fL (ref 80.0–100.0)
MONO ABS: 0.6 10*3/uL (ref 0.2–0.9)
Monocytes Relative: 9 %
NEUTROS ABS: 4.9 10*3/uL (ref 1.4–6.5)
NEUTROS PCT: 72 %
Platelets: 338 10*3/uL (ref 150–440)
RBC: 3.3 MIL/uL — AB (ref 3.80–5.20)
RDW: 12.8 % (ref 11.5–14.5)
WBC: 6.7 10*3/uL (ref 3.6–11.0)

## 2017-10-19 LAB — BASIC METABOLIC PANEL
ANION GAP: 9 (ref 5–15)
BUN: 26 mg/dL — ABNORMAL HIGH (ref 6–20)
CALCIUM: 9.7 mg/dL (ref 8.9–10.3)
CO2: 23 mmol/L (ref 22–32)
Chloride: 99 mmol/L — ABNORMAL LOW (ref 101–111)
Creatinine, Ser: 1.1 mg/dL — ABNORMAL HIGH (ref 0.44–1.00)
GFR, EST NON AFRICAN AMERICAN: 53 mL/min — AB (ref >60)
GLUCOSE: 146 mg/dL — AB (ref 65–99)
Potassium: 4.4 mmol/L (ref 3.5–5.1)
SODIUM: 131 mmol/L — AB (ref 135–145)

## 2017-10-19 NOTE — Progress Notes (Signed)
Hematology/Oncology Consult note [facility]  [phone] [phone]  Patient Care Team: Virginia Crews, MD as PCP - General (Family Medicine)   Name of the patient: Emma Fletcher  [id number]  Nov 02, 1955   Date of visit: 10/19/17  Diagnosis- normocytic anemia likely due to chronic disease  Chief complaint/ Reason for visit- routine f/u of anemia  Heme/Onc history: patient is a 62 year old female with a past medical history significant for CKD, COPD, sleep apnea, type 2 diabetes obesity hypertension,and hypothyroidism amongst other medical problems.CMP from 06/21/2016 showed mildly elevated creatinine of 1.19. CBC showed white count of 8.5, H&H of 9.8/29.8 with an MCV of 87.9 and platelet count of 367. TSH was normal at 1.13. Free T3 andT4 were also within normal limits. She was seen by previous hematologist Dr. Bynum Bellows from [place] and was receiving procrit shots from his office every 2 months. Her last shot was in December 2017. Dr. Murvin Natal left that practice and she has not received any EPO shots since then.   Further workup of the anemia from March 2018 was as follows: CBC showed white count of 5.8, H&H of 9.8/29.2 and a platelet count of 297. CMP was significant for chronic hyponatremia with a sodium of 125, BUN of 27 and creatinine of 1. Ferritin was normal and iron studies are within normal limits. B12 and folate were within normal limits. ESR and CRP was within normal limits. Reticulocyte count was 0.6% which was low for the degree of anemia. Multiple myeloma panel did not reveal any monoclonal protein. Haptoglobin was normal at 171.  Kidney functions have been normal over last 6 months with occasional fluctuations  Interval history- she has been grieving loss of her mother in feb 2019. She reports doing well otherwise. deneis any fatigue or other complaints  ECOG PS- 1 Pain scale- 0   Review of systems- Review of Systems    Constitutional: Negative for chills, fever, malaise/fatigue and weight loss.  HENT: Negative for congestion, ear discharge and nosebleeds.   Eyes: Negative for blurred vision.  Respiratory: Negative for cough, hemoptysis, sputum production, shortness of breath and wheezing.   Cardiovascular: Negative for chest pain, palpitations, orthopnea and claudication.  Gastrointestinal: Negative for abdominal pain, blood in stool, constipation, diarrhea, heartburn, melena, nausea and vomiting.  Genitourinary: Negative for dysuria, flank pain, frequency, hematuria and urgency.  Musculoskeletal: Negative for back pain, joint pain and myalgias.  Skin: Negative for rash.  Neurological: Negative for dizziness, tingling, focal weakness, seizures, weakness and headaches.  Endo/Heme/Allergies: Does not bruise/bleed easily.  Psychiatric/Behavioral: Negative for depression and suicidal ideas. The patient does not have insomnia.     No Known Allergies   Past Medical History:  Diagnosis Date  . Anemia   . Cardiomyopathy secondary   . Developmental delay   . Diabetes mellitus without complication ([place])   . Edema   . Hypercholesterolemia   . Unspecified essential hypertension      Past Surgical History:  Procedure Laterality Date  . none      Social History   Socioeconomic History  . Marital status: Single    Spouse name: Not on file  . Number of children: 0  . Years of education: Not on file  . Highest education level: Not on file  Occupational History  . Occupation: Part time works in [employer]  . Financial resource strain: Not on file  . Food insecurity:    Worry: Not on file  Inability: Not on file  . Transportation needs:    Medical: Not on file    Non-medical: Not on file  Tobacco Use  . Smoking status: Never Smoker  . Smokeless tobacco: Never Used  Substance and Sexual Activity  . Alcohol use: No    Alcohol/week: 0.0 oz  . Drug use: No  . Sexual activity:  Not on file  Lifestyle  . Physical activity:    Days per week: Not on file    Minutes per session: Not on file  . Stress: Not on file  Relationships  . Social connections:    Talks on phone: Not on file    Gets together: Not on file    Attends religious service: Not on file    Active member of club or organization: Not on file    Attends meetings of clubs or organizations: Not on file    Relationship status: Not on file  . Intimate partner violence:    Fear of current or ex partner: Not on file    Emotionally abused: Not on file    Physically abused: Not on file    Forced sexual activity: Not on file  Other Topics Concern  . Not on file  Social History Narrative  . Not on file    Family History  Problem Relation Age of Onset  . Diabetes Mother   . Hypertension Mother   . Stroke Mother   . Diabetes Father   . Coronary artery disease Father   . Breast cancer Maternal Aunt   . Lung cancer Maternal Uncle   . Prostate cancer Paternal Uncle   . Uterine cancer Other      Current Outpatient Medications:  .  budesonide-formoterol (SYMBICORT) 160-4.5 MCG/ACT inhaler, Inhale 2 puffs into the lungs 2 (two) times daily., Disp: , Rfl:  .  Cholecalciferol (VITAMIN D3) 5000 units CAPS, Take by mouth., Disp: , Rfl:  .  Choline Fenofibrate (FENOFIBRIC ACID) 135 MG CPDR, Take 135 mg by mouth daily. , Disp: , Rfl:  .  fluticasone (FLONASE) 50 MCG/ACT nasal spray, Place 2 sprays into both nostrils daily., Disp: 16 g, Rfl: 6 .  furosemide (LASIX) 20 MG tablet, Take 20 mg by mouth daily.  , Disp: , Rfl:  .  Iron, Ferrous Sulfate, 142 (45 Fe) MG TBCR, Take 90 mg of iron by mouth daily. 2 a day, Disp: , Rfl:  .  lansoprazole (PREVACID) 30 MG capsule, Take 30 mg by mouth daily. , Disp: , Rfl:  .  levothyroxine (SYNTHROID, LEVOTHROID) 100 MCG tablet, Take 1 tablet (100 mcg total) by mouth daily., Disp: 30 tablet, Rfl: 6 .  lisinopril (PRINIVIL,ZESTRIL) 20 MG tablet, Take 20 mg by mouth daily.  ,  Disp: , Rfl:  .  metFORMIN (GLUCOPHAGE-XR) 500 MG 24 hr tablet, Take 1,000 mg by mouth every evening. , Disp: , Rfl: 0 .  Multiple Vitamins-Minerals (ONE-A-DAY WOMENS 50 PLUS PO), Take 1 tablet by mouth daily., Disp: , Rfl:  .  potassium chloride (K-DUR,KLOR-CON) 10 MEQ tablet, Take 10 mEq by mouth daily., Disp: , Rfl:  .  SPIRIVA HANDIHALER 18 MCG inhalation capsule, Place 18 mcg into inhaler and inhale daily. , Disp: , Rfl:  .  vitamin C (ASCORBIC ACID) 500 MG tablet, Take 500 mg by mouth daily., Disp: , Rfl:   Physical exam:  Vitals:   10/19/17 0847  BP: 120/77  Pulse: 67  Resp: 18  Temp: (!) 97.5 F (36.4 C)  TempSrc: Tympanic  SpO2: 100%  Weight: 239 lb (108.4 kg)  Height: '5\' 6"'  (1.676 m)   Physical Exam  Constitutional: She is oriented to person, place, and time. She appears well-developed and well-nourished.  HENT:  Head: Normocephalic and atraumatic.  Eyes: Pupils are equal, round, and reactive to light. EOM are normal.  Neck: Normal range of motion.  Cardiovascular: Normal rate, regular rhythm and normal heart sounds.  Pulmonary/Chest: Effort normal and breath sounds normal.  Abdominal: Soft. Bowel sounds are normal.  Musculoskeletal: She exhibits edema (trace).  Compression stockings in place  Neurological: She is alert and oriented to person, place, and time.  Skin: Skin is warm and dry.     CMP Latest Ref Rng & Units 10/19/2017  Glucose 65 - 99 mg/dL 146(H)  BUN 6 - 20 mg/dL 26(H)  Creatinine 0.44 - 1.00 mg/dL 1.10(H)  Sodium 135 - 145 mmol/L 131(L)  Potassium 3.5 - 5.1 mmol/L 4.4  Chloride 101 - 111 mmol/L 99(L)  CO2 22 - 32 mmol/L 23  Calcium 8.9 - 10.3 mg/dL 9.7  Total Protein 6.0 - 8.5 g/dL -  Total Bilirubin 0.0 - 1.2 mg/dL -  Alkaline Phos 39 - 117 IU/L -  AST 0 - 40 IU/L -  ALT 0 - 32 IU/L -   CBC Latest Ref Rng & Units 10/19/2017  WBC 3.6 - 11.0 K/uL 6.7  Hemoglobin 12.0 - 16.0 g/dL 10.1(L)  Hematocrit 35.0 - 47.0 % 29.1(L)  Platelets 150 - 440  K/uL 338    Assessment and plan- Patient is a 62 y.o. female with normocytic anemia likely due to anemia of chronic disease  Patients hb has been around 10 since dec 2017. She does not have CKD. She has not required any EPO. Anemia work up has been [employer]. Repeat cbc in 4 and 8 months along with cmp. I will see her back in 8 months and check iron studies, b12 and folate at that time as well   Visit Diagnosis 1. Normocytic anemia      Dr. Randa Evens, MD, MPH [facility] at [facility] [phone] 10/19/2017 11:53 AM

## 2017-10-19 NOTE — Progress Notes (Signed)
No new changes noted today 

## 2017-11-02 ENCOUNTER — Other Ambulatory Visit: Payer: Self-pay | Admitting: Family Medicine

## 2017-11-02 NOTE — Telephone Encounter (Addendum)
[facility] pharmacy faxed a refill request for the following medications. Thanks CC  metFORMIN (GLUCOPHAGE-XR) 500 MG 24 hr tablet   potassium chloride (K-DUR,KLOR-CON) 10 MEQ tablet   levothyroxine (SYNTHROID, LEVOTHROID) 100 MCG tablet   furosemide (LASIX) 20 MG tablet   lansoprazole (PREVACID) 30 MG capsule  lisinopril (PRINIVIL,ZESTRIL) 20 MG tablet

## 2017-11-05 ENCOUNTER — Encounter: Payer: Self-pay | Admitting: Podiatry

## 2017-11-05 ENCOUNTER — Ambulatory Visit (INDEPENDENT_AMBULATORY_CARE_PROVIDER_SITE_OTHER): Payer: Medicare Other | Admitting: Podiatry

## 2017-11-05 DIAGNOSIS — M79676 Pain in unspecified toe(s): Secondary | ICD-10-CM

## 2017-11-05 DIAGNOSIS — B351 Tinea unguium: Secondary | ICD-10-CM | POA: Diagnosis not present

## 2017-11-05 DIAGNOSIS — E119 Type 2 diabetes mellitus without complications: Secondary | ICD-10-CM

## 2017-11-05 MED ORDER — POTASSIUM CHLORIDE CRYS ER 10 MEQ PO TBCR: 10.0000 meq | EXTENDED_RELEASE_TABLET | Freq: Every day | ORAL | 2 refills | Status: DC

## 2017-11-05 MED ORDER — LANSOPRAZOLE 30 MG PO CPDR: 30.0000 mg | DELAYED_RELEASE_CAPSULE | Freq: Every day | ORAL | 2 refills | Status: DC

## 2017-11-05 MED ORDER — LEVOTHYROXINE SODIUM 100 MCG PO TABS: 100.0000 ug | ORAL_TABLET | Freq: Every day | ORAL | 2 refills | Status: DC

## 2017-11-05 MED ORDER — METFORMIN HCL ER 500 MG PO TB24: 1000.0000 mg | ORAL_TABLET | Freq: Every evening | ORAL | 2 refills | Status: DC

## 2017-11-05 MED ORDER — LISINOPRIL 20 MG PO TABS: 20.0000 mg | ORAL_TABLET | Freq: Every day | ORAL | 2 refills | Status: DC

## 2017-11-05 MED ORDER — FUROSEMIDE 20 MG PO TABS: 20.0000 mg | ORAL_TABLET | Freq: Every day | ORAL | 2 refills | Status: DC

## 2017-11-05 NOTE — Progress Notes (Addendum)
Complaint:  Visit Type: Patient returns to my office for continued preventative foot care services. Complaint: Patient states" my nails have grown long and thick and become painful to walk and wear shoes" Patient has been diagnosed with DM with no foot complications. The patient presents for preventative foot care services. No changes to ROS  Podiatric Exam: Vascular: dorsalis pedis and posterior tibial pulses are palpable bilateral. Capillary return is immediate. Temperature gradient is WNL. Skin turgor WNL  Sensorium: Normal Semmes Weinstein monofilament test. Normal tactile sensation bilaterally. Nail Exam: Pt has elongated ingrown toenails  B/L. Ulcer Exam: There is no evidence of ulcer or pre-ulcerative changes or infection. Orthopedic Exam: Muscle tone and strength are WNL. No limitations in general ROM. No crepitus or effusions noted. Foot type and digits show no abnormalities. Bony prominences are unremarkable. Skin: No Porokeratosis. No infection or ulcers  Diagnosis:  Nail Dystrophy, Pain in right toe, pain in left toes  Treatment & Plan Procedures and Treatment: Consent by patient was obtained for treatment procedures.   Debridement of mycotic and hypertrophic toenails, 1 through 5 bilateral and clearing of subungual debris. No ulceration, no infection noted.  Return Visit-Office Procedure: Patient instructed to return to the office for a follow up visit prn  for continued evaluation and treatment. ABN signed for 2019.    Helane Gunther DPM

## 2017-12-12 ENCOUNTER — Other Ambulatory Visit: Payer: Self-pay | Admitting: *Deleted

## 2017-12-12 NOTE — Telephone Encounter (Signed)
I don't see any mention of COPD or asthma in Dr. Senaida Lange previous note with patient. I do not see any smoking history or PFTs or signs of COPD on CXR from 2017. I am going to decline these medications until patient talks to Dr. B about why she needs them. Furthermore, do not see any documentation of vitamin D deficiency requiring that dose of vitamin D.

## 2017-12-12 NOTE — Telephone Encounter (Signed)
Dr. B patient. LOV 09/20/17. Can you please review?

## 2017-12-12 NOTE — Telephone Encounter (Signed)
Patient called office requesting refills for her 2 inhalers and her vitamin D be sent to [facility]? Please advise?

## 2017-12-14 NOTE — Telephone Encounter (Signed)
I called and spoke with patients sister June Watson (ok per [place]) and advised her as below. She verbally voiced understanding and states they can wait until Dr. B returns to have medications refilled.

## 2017-12-24 NOTE — Telephone Encounter (Signed)
Has patient been on Symbicort and Spiriva for a while?  I don't remember [place] discussing this during establish care visit.  I'm ok refilling those for now.  Would also like to get records from [facility] to look into this further.  Stay off Vit D for now and we can recheck it at next appt.  Erasmo Downer, MD, MPH [facility] 12/24/2017 8:56 AM

## 2017-12-25 NOTE — Telephone Encounter (Signed)
Mrs. Emma Fletcher reports that Mrs. Emma Fletcher has been on Symbicort and Spiriva for a couple of years. She also reports that they received inhalers from [facility] last week.

## 2018-01-23 DIAGNOSIS — H2513 Age-related nuclear cataract, bilateral: Secondary | ICD-10-CM | POA: Diagnosis not present

## 2018-01-23 LAB — HM DIABETES EYE EXAM

## 2018-02-11 ENCOUNTER — Ambulatory Visit (INDEPENDENT_AMBULATORY_CARE_PROVIDER_SITE_OTHER): Payer: Medicare Other | Admitting: Podiatry

## 2018-02-11 ENCOUNTER — Encounter: Payer: Self-pay | Admitting: Podiatry

## 2018-02-11 DIAGNOSIS — M79676 Pain in unspecified toe(s): Secondary | ICD-10-CM

## 2018-02-11 DIAGNOSIS — L600 Ingrowing nail: Secondary | ICD-10-CM | POA: Diagnosis not present

## 2018-02-11 DIAGNOSIS — B351 Tinea unguium: Secondary | ICD-10-CM | POA: Diagnosis not present

## 2018-02-11 DIAGNOSIS — E119 Type 2 diabetes mellitus without complications: Secondary | ICD-10-CM | POA: Diagnosis not present

## 2018-02-11 NOTE — Progress Notes (Signed)
Complaint:  Visit Type: Patient returns to my office for continued preventative foot care services. Complaint: Patient states" my nails have grown long and thick and become painful to walk and wear shoes" Patient has been diagnosed with DM with no foot complications. The patient presents for preventative foot care services. No changes to ROS  Podiatric Exam: Vascular: dorsalis pedis and posterior tibial pulses are palpable bilateral. Capillary return is immediate. Temperature gradient is WNL. Skin turgor WNL  Sensorium: Normal Semmes Weinstein monofilament test. Normal tactile sensation bilaterally. Nail Exam: Pt has elongated ingrown toenails  B/L. Ulcer Exam: There is no evidence of ulcer or pre-ulcerative changes or infection. Orthopedic Exam: Muscle tone and strength are WNL. No limitations in general ROM. No crepitus or effusions noted. Foot type and digits show no abnormalities. Bony prominences are unremarkable. Painful bone at fifth metabase. Skin: No Porokeratosis. No infection or ulcers  Diagnosis:  Onychomycosis  Hallux  B/L, Pain in right toe, pain in left toes  Treatment & Plan Procedures and Treatment: Consent by patient was obtained for treatment procedures.   Debridement of mycotic and hypertrophic toenails, 1 through 5 bilateral and clearing of subungual debris. No ulceration, no infection noted.  Return Visit-Office Procedure: Patient instructed to return to the office for a follow up visit prn  for continued evaluation and treatment. ABN signed for 2019.    Helane Gunther DPM

## 2018-02-19 ENCOUNTER — Inpatient Hospital Stay: Payer: Medicare Other | Attending: Oncology

## 2018-02-19 DIAGNOSIS — D649 Anemia, unspecified: Secondary | ICD-10-CM | POA: Insufficient documentation

## 2018-02-19 LAB — CBC WITH DIFFERENTIAL/PLATELET
BASOS PCT: 0 %
Basophils Absolute: 0 10*3/uL (ref 0–0.1)
EOS ABS: 0.1 10*3/uL (ref 0–0.7)
EOS PCT: 1 %
HCT: 30.5 % — ABNORMAL LOW (ref 35.0–47.0)
HEMOGLOBIN: 10.3 g/dL — AB (ref 12.0–16.0)
Lymphocytes Relative: 19 %
Lymphs Abs: 1.7 10*3/uL (ref 1.0–3.6)
MCH: 30 pg (ref 26.0–34.0)
MCHC: 33.8 g/dL (ref 32.0–36.0)
MCV: 88.8 fL (ref 80.0–100.0)
MONOS PCT: 7 %
Monocytes Absolute: 0.7 10*3/uL (ref 0.2–0.9)
NEUTROS PCT: 73 %
Neutro Abs: 6.4 10*3/uL (ref 1.4–6.5)
PLATELETS: 289 10*3/uL (ref 150–440)
RBC: 3.44 MIL/uL — AB (ref 3.80–5.20)
RDW: 13.2 % (ref 11.5–14.5)
WBC: 8.8 10*3/uL (ref 3.6–11.0)

## 2018-02-19 LAB — BASIC METABOLIC PANEL
Anion gap: 10 (ref 5–15)
BUN: 19 mg/dL (ref 8–23)
CALCIUM: 9.1 mg/dL (ref 8.9–10.3)
CO2: 25 mmol/L (ref 22–32)
CREATININE: 0.79 mg/dL (ref 0.44–1.00)
Chloride: 101 mmol/L (ref 98–111)
GFR calc non Af Amer: >60 mL/min (ref >60)
Glucose, Bld: 137 mg/dL — ABNORMAL HIGH (ref 70–99)
Potassium: 4.5 mmol/L (ref 3.5–5.1)
Sodium: 136 mmol/L (ref 135–145)

## 2018-02-25 ENCOUNTER — Telehealth: Payer: Self-pay | Admitting: Family Medicine

## 2018-02-25 NOTE — Telephone Encounter (Signed)
It looks like her most recent labs were ordered by Dr. Smith Robert.  She could follow-up with their office about her results she has not heard about them yet.  Erasmo Downer, MD, MPH [facility] 02/25/2018 4:40 PM

## 2018-02-25 NOTE — Telephone Encounter (Signed)
Pt called wanting to know if her lab has came back  CB#  [phone]  Thank Barth Kirks

## 2018-02-26 NOTE — Telephone Encounter (Signed)
Patient advised. She states her sister called and got the results for her.

## 2018-03-26 ENCOUNTER — Other Ambulatory Visit (HOSPITAL_COMMUNITY)
Admission: RE | Admit: 2018-03-26 | Discharge: 2018-03-26 | Disposition: A | Discharge status: Home / Self Care | Payer: Medicare Other | Source: Ambulatory Visit | Attending: Family Medicine | Admitting: Family Medicine

## 2018-03-26 ENCOUNTER — Encounter: Payer: Self-pay | Admitting: Family Medicine

## 2018-03-26 ENCOUNTER — Other Ambulatory Visit: Payer: Self-pay

## 2018-03-26 ENCOUNTER — Ambulatory Visit (INDEPENDENT_AMBULATORY_CARE_PROVIDER_SITE_OTHER): Payer: Medicare Other | Admitting: Family Medicine

## 2018-03-26 VITALS — BP 138/88 | HR 68 | Temp 98.2°F | Wt 223.0 lb

## 2018-03-26 DIAGNOSIS — N182 Chronic kidney disease, stage 2 (mild): Secondary | ICD-10-CM | POA: Insufficient documentation

## 2018-03-26 DIAGNOSIS — Z7984 Long term (current) use of oral hypoglycemic drugs: Secondary | ICD-10-CM | POA: Diagnosis not present

## 2018-03-26 DIAGNOSIS — E7800 Pure hypercholesterolemia, unspecified: Secondary | ICD-10-CM | POA: Insufficient documentation

## 2018-03-26 DIAGNOSIS — H9193 Unspecified hearing loss, bilateral: Secondary | ICD-10-CM

## 2018-03-26 DIAGNOSIS — Z79890 Hormone replacement therapy (postmenopausal): Secondary | ICD-10-CM | POA: Insufficient documentation

## 2018-03-26 DIAGNOSIS — Z124 Encounter for screening for malignant neoplasm of cervix: Secondary | ICD-10-CM | POA: Diagnosis not present

## 2018-03-26 DIAGNOSIS — E119 Type 2 diabetes mellitus without complications: Secondary | ICD-10-CM

## 2018-03-26 DIAGNOSIS — Z7951 Long term (current) use of inhaled steroids: Secondary | ICD-10-CM | POA: Insufficient documentation

## 2018-03-26 DIAGNOSIS — I129 Hypertensive chronic kidney disease with stage 1 through stage 4 chronic kidney disease, or unspecified chronic kidney disease: Secondary | ICD-10-CM | POA: Insufficient documentation

## 2018-03-26 DIAGNOSIS — Z1239 Encounter for other screening for malignant neoplasm of breast: Secondary | ICD-10-CM

## 2018-03-26 DIAGNOSIS — Z79899 Other long term (current) drug therapy: Secondary | ICD-10-CM | POA: Diagnosis not present

## 2018-03-26 DIAGNOSIS — Z23 Encounter for immunization: Secondary | ICD-10-CM | POA: Diagnosis not present

## 2018-03-26 DIAGNOSIS — Z1211 Encounter for screening for malignant neoplasm of colon: Secondary | ICD-10-CM

## 2018-03-26 DIAGNOSIS — I429 Cardiomyopathy, unspecified: Secondary | ICD-10-CM | POA: Insufficient documentation

## 2018-03-26 DIAGNOSIS — I10 Essential (primary) hypertension: Secondary | ICD-10-CM

## 2018-03-26 DIAGNOSIS — E1122 Type 2 diabetes mellitus with diabetic chronic kidney disease: Secondary | ICD-10-CM | POA: Insufficient documentation

## 2018-03-26 DIAGNOSIS — E785 Hyperlipidemia, unspecified: Secondary | ICD-10-CM | POA: Insufficient documentation

## 2018-03-26 DIAGNOSIS — E039 Hypothyroidism, unspecified: Secondary | ICD-10-CM

## 2018-03-26 DIAGNOSIS — Z0000 Encounter for general adult medical examination without abnormal findings: Secondary | ICD-10-CM | POA: Insufficient documentation

## 2018-03-26 MED ORDER — ONETOUCH ULTRA 2 W/DEVICE KIT: PACK | 0 refills | Status: AC

## 2018-03-26 MED ORDER — VITAMIN D3 125 MCG (5000 UT) PO CAPS: 1.0000 | ORAL_CAPSULE | Freq: Every day | ORAL | 3 refills | Status: DC

## 2018-03-26 MED ORDER — ONETOUCH DELICA LANCETS 33G MISC: 5 refills | Status: DC

## 2018-03-26 MED ORDER — GLUCOSE BLOOD VI STRP: ORAL_STRIP | 3 refills | Status: AC

## 2018-03-26 NOTE — Patient Instructions (Addendum)
 The CDC recommends two doses of Shingrix (the shingles vaccine) separated by 2 to 6 months for adults age 61 years and older. I recommend checking with your insurance plan regarding coverage for this vaccine.     Preventive Care 40-64 Years, Female Preventive care refers to lifestyle choices and visits with your health care provider that can promote health and wellness. What does preventive care include?  A yearly physical exam. This is also called an annual well check.  Dental exams once or twice a year.  Routine eye exams. Ask your health care provider how often you should have your eyes checked.  Personal lifestyle choices, including: ? Daily care of your teeth and gums. ? Regular physical activity. ? Eating a healthy diet. ? Avoiding tobacco and drug use. ? Limiting alcohol use. ? Practicing safe sex. ? Taking low-dose aspirin daily starting at age 61. ? Taking vitamin and mineral supplements as recommended by your health care provider. What happens during an annual well check? The services and screenings done by your health care provider during your annual well check will depend on your age, overall health, lifestyle risk factors, and family history of disease. Counseling Your health care provider may ask you questions about your:  Alcohol use.  Tobacco use.  Drug use.  Emotional well-being.  Home and relationship well-being.  Sexual activity.  Eating habits.  Work and work environment.  Method of birth control.  Menstrual cycle.  Pregnancy history.  Screening You may have the following tests or measurements:  Height, weight, and BMI.  Blood pressure.  Lipid and cholesterol levels. These may be checked every 5 years, or more frequently if you are over 61 years old.  Skin check.  Lung cancer screening. You may have this screening every year starting at age 55 if you have a 30-pack-year history of smoking and currently smoke or have quit within the  past 15 years.  Fecal occult blood test (FOBT) of the stool. You may have this test every year starting at age 61.  Flexible sigmoidoscopy or colonoscopy. You may have a sigmoidoscopy every 5 years or a colonoscopy every 10 years starting at age 61.  Hepatitis C blood test.  Hepatitis B blood test.  Sexually transmitted disease (STD) testing.  Diabetes screening. This is done by checking your blood sugar (glucose) after you have not eaten for a while (fasting). You may have this done every 1-3 years.  Mammogram. This may be done every 1-2 years. Talk to your health care provider about when you should start having regular mammograms. This may depend on whether you have a family history of breast cancer.  BRCA-related cancer screening. This may be done if you have a family history of breast, ovarian, tubal, or peritoneal cancers.  Pelvic exam and Pap test. This may be done every 3 years starting at age 21. Starting at age 30, this may be done every 5 years if you have a Pap test in combination with an HPV test.  Bone density scan. This is done to screen for osteoporosis. You may have this scan if you are at high risk for osteoporosis.  Discuss your test results, treatment options, and if necessary, the need for more tests with your health care provider. Vaccines Your health care provider may recommend certain vaccines, such as:  Influenza vaccine. This is recommended every year.  Tetanus, diphtheria, and acellular pertussis (Tdap, Td) vaccine. You may need a Td booster every 10 years.  Varicella vaccine. You   may need this if you have not been vaccinated.  Zoster vaccine. You may need this after age 60.  Measles, mumps, and rubella (MMR) vaccine. You may need at least one dose of MMR if you were born in 1957 or later. You may also need a second dose.  Pneumococcal 13-valent conjugate (PCV13) vaccine. You may need this if you have certain conditions and were not previously  vaccinated.  Pneumococcal polysaccharide (PPSV23) vaccine. You may need one or two doses if you smoke cigarettes or if you have certain conditions.  Meningococcal vaccine. You may need this if you have certain conditions.  Hepatitis A vaccine. You may need this if you have certain conditions or if you travel or work in places where you may be exposed to hepatitis A.  Hepatitis B vaccine. You may need this if you have certain conditions or if you travel or work in places where you may be exposed to hepatitis B.  Haemophilus influenzae type b (Hib) vaccine. You may need this if you have certain conditions.  Talk to your health care provider about which screenings and vaccines you need and how often you need them. This information is not intended to replace advice given to you by your health care provider. Make sure you discuss any questions you have with your health care provider. Document Released: 07/02/2015 Document Revised: 02/23/2016 Document Reviewed: 04/06/2015 Elsevier Interactive Patient Education  2018 [employer]  

## 2018-03-26 NOTE — Progress Notes (Signed)
Patient: Emma Fletcher, Female    DOB: April 17, 1956, 62 y.o.   MRN: [id number] Visit Date: 03/28/2018  Today's Provider: Lavon Paganini, MD   Chief Complaint  Patient presents with  . Annual Exam   Subjective:    Annual physical exam Emma Fletcher is a 62 y.o. female who presents today for health maintenance and complete physical. She feels well.  Pt reports having right knee pain.  She fell labor day weekend.   She reports exercising regularly. She reports she is sleeping well.  Thinks last colonoscopy was ~12 years ago, but there are no records in the chart.  She believes she had this done at [facility].  We will request records She is due for mammogram and Pap smear.  She believes has been since she has had either of these Last tetanus vaccination 10/2010 Up-to-date on HIV and hepatitis C screening Agrees to influenza and pneumococcal vaccinations today ----------------------------------------------------------------- T2DM - Checking BG at home: yes, ~BID range 90-170s - Medications: Metformin XR 1000 mg nightly - Compliance: Good - Diet: Low-carb - eye exam: Up-to-date - foot exam: Needs today - microalbumin: N/A, on ACE inhibitor - denies symptoms of hypoglycemia, polyuria, polydipsia, numbness extremities, foot ulcers/trauma  Hypothyroidism: Taking Synthroid 100 Mcg daily with good compliance and no side effects.  Denies hair/skin changes, constipaiton, diarrhea, heat/cold intolerance or other symptoms.  Review of Systems  Constitutional: Negative.   HENT: Positive for hearing loss. Negative for congestion, dental problem, drooling, ear discharge, ear pain, facial swelling, mouth sores, nosebleeds, postnasal drip, rhinorrhea, sinus pressure, sinus pain, sneezing, sore throat, tinnitus, trouble swallowing and voice change.   Eyes: Negative.   Respiratory: Negative.   Cardiovascular: Negative.   Gastrointestinal: Negative.   Endocrine: Negative.   Genitourinary:  Negative.   Musculoskeletal: Positive for arthralgias. Negative for back pain, gait problem, joint swelling, myalgias, neck pain and neck stiffness.  Skin: Negative.   Allergic/Immunologic: Negative.   Neurological: Negative.   Hematological: Negative.   Psychiatric/Behavioral: Negative.     Social History      She  reports that she has never smoked. She has never used smokeless tobacco. She reports that she does not drink alcohol or use drugs.       Social History   Socioeconomic History  . Marital status: Single    Spouse name: Not on file  . Number of children: 0  . Years of education: Not on file  . Highest education level: Not on file  Occupational History  . Occupation: Part time works in [employer]  . Financial resource strain: Not on file  . Food insecurity:    Worry: Not on file    Inability: Not on file  . Transportation needs:    Medical: Not on file    Non-medical: Not on file  Tobacco Use  . Smoking status: Never Smoker  . Smokeless tobacco: Never Used  Substance and Sexual Activity  . Alcohol use: No    Alcohol/week: 0.0 standard drinks  . Drug use: No  . Sexual activity: Not on file  Lifestyle  . Physical activity:    Days per week: Not on file    Minutes per session: Not on file  . Stress: Not on file  Relationships  . Social connections:    Talks on phone: Not on file    Gets together: Not on file    Attends religious service: Not on file    Active member  of club or organization: Not on file    Attends meetings of clubs or organizations: Not on file    Relationship status: Not on file  Other Topics Concern  . Not on file  Social History Narrative  . Not on file    Past Medical History:  Diagnosis Date  . Anemia   . Cardiomyopathy secondary   . Developmental delay   . Diabetes mellitus without complication ([place])   . Edema   . Hypercholesterolemia   . Unspecified essential hypertension      Patient Active Problem List     Diagnosis Date Noted  . HLD (hyperlipidemia) 09/20/2017  . Hypothyroidism 09/20/2017  . Dysfunction of both eustachian tubes 09/20/2017  . CKD (chronic kidney disease), stage II 05/16/2016  . T2DM (type 2 diabetes mellitus) ([place]) 12/18/2015  . Hyponatremia 12/18/2015  . Anemia 12/18/2015  . Essential hypertension 11/10/2008  . Secondary cardiomyopathy ([place]) 11/10/2008  . EDEMA 11/10/2008    Past Surgical History:  Procedure Laterality Date  . none      Family History        Family Status  Relation Name Status  . Mother  Deceased at age 29  . Father  Deceased at age 65  . Mat Aunt  (Not Specified)  . Mat Uncle  (Not Specified)  . Annamarie Major  (Not Specified)  . Other  (Not Specified)        Her family history includes Breast cancer in her maternal aunt; Coronary artery disease in her father; Diabetes in her father and mother; Hypertension in her mother; Lung cancer in her maternal uncle; Prostate cancer in her paternal uncle; Stroke in her mother; Uterine cancer in her other.      No Known Allergies   Current Outpatient Medications:  .  budesonide-formoterol (SYMBICORT) 160-4.5 MCG/ACT inhaler, Inhale 2 puffs into the lungs 2 (two) times daily., Disp: , Rfl:  .  Cholecalciferol (VITAMIN D3) 5000 units CAPS, Take 1 capsule (5,000 Units total) by mouth daily., Disp: 90 capsule, Rfl: 3 .  Ferrous Sulfate (IRON) 325 (65 Fe) MG TABS, Take 2 tablets by mouth daily., Disp: , Rfl:  .  furosemide (LASIX) 20 MG tablet, Take 1 tablet (20 mg total) by mouth daily., Disp: 90 tablet, Rfl: 2 .  lansoprazole (PREVACID) 30 MG capsule, Take 1 capsule (30 mg total) by mouth daily., Disp: 90 capsule, Rfl: 2 .  levothyroxine (SYNTHROID, LEVOTHROID) 100 MCG tablet, Take 1 tablet (100 mcg total) by mouth daily., Disp: 90 tablet, Rfl: 2 .  lisinopril (PRINIVIL,ZESTRIL) 20 MG tablet, Take 1 tablet (20 mg total) by mouth daily., Disp: 90 tablet, Rfl: 2 .  metFORMIN (GLUCOPHAGE-XR) 500 MG 24 hr  tablet, Take 2 tablets (1,000 mg total) by mouth every evening., Disp: 180 tablet, Rfl: 2 .  Multiple Vitamins-Minerals (ONE-A-DAY WOMENS 50 PLUS PO), Take 1 tablet by mouth daily., Disp: , Rfl:  .  potassium chloride (K-DUR,KLOR-CON) 10 MEQ tablet, Take 1 tablet (10 mEq total) by mouth daily., Disp: 90 tablet, Rfl: 2 .  SPIRIVA HANDIHALER 18 MCG inhalation capsule, Place 18 mcg into inhaler and inhale daily. , Disp: , Rfl:  .  vitamin C (ASCORBIC ACID) 500 MG tablet, Take 500 mg by mouth daily., Disp: , Rfl:  .  Blood Glucose Monitoring Suppl (ONE TOUCH ULTRA 2) w/Device KIT, Use as directed to check blood sugar once daily, Disp: 1 each, Rfl: 0 .  Choline Fenofibrate (FENOFIBRIC ACID) 135 MG CPDR, Take 135 mg by  mouth daily. , Disp: , Rfl:  .  fluticasone (FLONASE) 50 MCG/ACT nasal spray, Place 2 sprays into both nostrils daily. (Patient not taking: Reported on 03/26/2018), Disp: 16 g, Rfl: 6 .  glucose blood (ONE TOUCH ULTRA TEST) test strip, Use as instructed to check blood glucose daily, Disp: 100 each, Rfl: 3 .  Iron, Ferrous Sulfate, 142 (45 Fe) MG TBCR, Take 90 mg of iron by mouth daily. 2 a day, Disp: , Rfl:  .  ONETOUCH DELICA LANCETS 56E MISC, Use as directed to check blood glucose daily, Disp: 100 each, Rfl: 5   Patient Care Team: Virginia Crews, MD as PCP - General (Family Medicine)      Objective:   Vitals: BP 138/88   Pulse 68   Temp 98.2 F (36.8 C) (Oral)   Wt 223 lb (101.2 kg)   LMP  (LMP Unknown)   SpO2 98%   BMI 35.99 kg/m    Vitals:   03/26/18 0915  BP: 138/88  Pulse: 68  Temp: 98.2 F (36.8 C)  TempSrc: Oral  SpO2: 98%  Weight: 223 lb (101.2 kg)     Physical Exam  Constitutional: She is oriented to person, place, and time. She appears well-developed and well-nourished. No distress.  HENT:  Head: Normocephalic and atraumatic.  Right Ear: External ear normal.  Left Ear: External ear normal.  Nose: Nose normal.  Mouth/Throat: Oropharynx is clear  and moist.  Eyes: Pupils are equal, round, and reactive to light. Conjunctivae and EOM are normal. No scleral icterus.  Neck: Neck supple. No thyromegaly present.  Cardiovascular: Normal rate, regular rhythm, normal heart sounds and intact distal pulses.  No murmur heard. Pulmonary/Chest: Effort normal and breath sounds normal. No respiratory distress. She has no wheezes. She has no rales.  Abdominal: Soft. Bowel sounds are normal. She exhibits no distension. There is no tenderness. There is no rebound and no guarding.  Genitourinary:  Genitourinary Comments: GYN:  External genitalia within normal limits.  Vaginal mucosa pink, moist, normal rugae.  Cystocele present. + Vaginismus, difficult to insert speculum.  Nonfriable cervix without lesions, no discharge or bleeding noted on speculum exam.   Breasts: breasts appear normal, no suspicious masses, no skin or nipple changes or axillary nodes.   Musculoskeletal: She exhibits no edema or deformity.  Lymphadenopathy:    She has no cervical adenopathy.  Neurological: She is alert and oriented to person, place, and time.  Skin: Skin is warm and dry. Capillary refill takes less than 2 seconds. No rash noted.  Psychiatric: She has a normal mood and affect. Her behavior is normal.  Vitals reviewed.    Activities of Daily Living In your present state of health, do you have any difficulty performing the following activities: 03/26/2018 09/20/2017  Hearing? Tempie Donning  Vision? N Y  Difficulty concentrating or making decisions? N N  Walking or climbing stairs? N N  Dressing or bathing? N N  Doing errands, shopping? N Y  Some recent data might be hidden   Fall Risk Assessment Fall Risk  03/26/2018 09/20/2017  Falls in the past year? Yes Yes  Number falls in past yr: 1 1  Injury with Fall? - Yes  Follow up - Falls evaluation completed    Cognitive Testing - 6-CIT Declined. Has intellectual disability at baseline.  Audit-C Alcohol Use Screening     Alcohol Use Disorder Test (AUDIT) 03/26/2018  1. How often do you have a drink containing alcohol? 0  3. How often  do you have six or more drinks on one occasion? 0    A score of 3 or more in women, and 4 or more in men indicates increased risk for alcohol abuse, EXCEPT if all of the points are from question 1   Depression Screen PHQ 2/9 Scores 03/26/2018 03/26/2018 09/20/2017  PHQ - 2 Score 0 0 0  PHQ- 9 Score 0 - -    Diabetic Foot Exam - Simple   Simple Foot Form Diabetic Foot exam was performed with the following findings:  Yes 03/26/2018 10:05 AM  Visual Inspection No deformities, no ulcerations, no other skin breakdown bilaterally:  Yes Sensation Testing Intact to touch and monofilament testing bilaterally:  Yes Pulse Check Posterior Tibialis and Dorsalis pulse intact bilaterally:  Yes Comments      Assessment & Plan:     Routine Health Maintenance and Physical Exam  Exercise Activities and Dietary recommendations Goals   None     Immunization History  Administered Date(s) Administered  . Influenza,inj,Quad PF,6+ Mos 03/26/2018  . Pneumococcal Polysaccharide-23 03/26/2018  . Tdap 10/20/2010    Health Maintenance  Topic Date Due  . PAP SMEAR  11/07/1977  . MAMMOGRAM  11/08/2006  . COLONOSCOPY  11/08/2006  . HEMOGLOBIN A1C  09/25/2018  . OPHTHALMOLOGY EXAM  01/24/2019  . FOOT EXAM  03/27/2019  . TETANUS/TDAP  10/19/2020  . INFLUENZA VACCINE  Completed  . PNEUMOCOCCAL POLYSACCHARIDE VACCINE AGE 31-64 HIGH RISK  Completed  . Hepatitis C Screening  Completed  . HIV Screening  Completed     Discussed health benefits of physical activity, and encouraged her to engage in regular exercise appropriate for her age and condition.    --------------------------------------------------------------------  Problem List Items Addressed This Visit      Cardiovascular and Mediastinum   Essential hypertension    Well controlled currently Continue lisinopril Check  BMP F/u in 6 months        Endocrine   T2DM (type 2 diabetes mellitus) ([place])    Previously well controlled Blood sugar log shows fairly good control No hypoglycemic episodes Again discussed diabetic diet and importance of exercise Recheck A1c Continue metformin at current dose Pending A1c, could consider dose titration if needed Up-to-date on screenings and vaccinations      Relevant Medications   Blood Glucose Monitoring Suppl (ONE TOUCH ULTRA 2) w/Device KIT   glucose blood (ONE TOUCH ULTRA TEST) test strip   ONETOUCH DELICA LANCETS 86H MISC   Other Relevant Orders   Hemoglobin A1c (Completed)   Pneumococcal polysaccharide vaccine 23-valent greater than or equal to 2yo subcutaneous/IM (Completed)   Hypothyroidism    Previously well controlled and currently asymptomatic Continue Synthroid at current dose Recheck TSH and dose adjust as indicated      Relevant Orders   TSH (Completed)     Genitourinary   CKD (chronic kidney disease), stage II    Recheck kidney function today Discussed importance of good control of hypertension and diabetes and avoidance of nephrotoxic medications       Other Visit Diagnoses    Medicare annual wellness visit, subsequent    -  Primary   Need for influenza vaccination       Relevant Orders   Flu Vaccine QUAD 36+ mos IM (Completed)   Breast cancer screening       Relevant Orders   MM 3D SCREEN BREAST BILATERAL   Cervical cancer screening       Relevant Orders   Cytology - PAP  Colon cancer screening       Relevant Orders   Ambulatory referral to Gastroenterology   Decreased hearing of both ears       Relevant Orders   Ambulatory referral to Audiology   Need for pneumococcal vaccination       Relevant Orders   Pneumococcal polysaccharide vaccine 23-valent greater than or equal to 2yo subcutaneous/IM (Completed)       Return in about 6 months (around 09/25/2018) for chronic disease f/u.   The entirety of the information  documented in the History of Present Illness, Review of Systems and Physical Exam were personally obtained by me. Portions of this information were initially documented by Ashley Royalty, CMA and reviewed by me for thoroughness and accuracy.    Virginia Crews, MD, MPH [facility] 03/28/2018 12:01 PM

## 2018-03-27 ENCOUNTER — Encounter: Payer: Self-pay | Admitting: Family Medicine

## 2018-03-27 ENCOUNTER — Telehealth: Payer: Self-pay

## 2018-03-27 ENCOUNTER — Telehealth: Payer: Self-pay | Admitting: Family Medicine

## 2018-03-27 LAB — TSH: TSH: 2.12 u[IU]/mL (ref 0.450–4.500)

## 2018-03-27 LAB — HEMOGLOBIN A1C
Est. average glucose Bld gHb Est-mCnc: 148 mg/dL
Hgb A1c MFr Bld: 6.8 % — ABNORMAL HIGH (ref 4.8–5.6)

## 2018-03-27 NOTE — Telephone Encounter (Signed)
-----   Message from Erasmo Downer, MD sent at 03/27/2018  9:55 AM EDT ----- Normal thyroid function.  Hemoglobin A1c, 25-month average of blood sugars, has increased slightly from 6.5-6.8.  This is still well controlled, as our goal is less than 7.  Continue current medications  Bacigalupo, Marzella Schlein, MD, MPH [facility] 03/27/2018 9:55 AM

## 2018-03-27 NOTE — Telephone Encounter (Signed)
Patient advised.KW 

## 2018-03-27 NOTE — Telephone Encounter (Signed)
Pt is requesting that we call her sister June Watson @ [phone] and advised June of pt's labs. June is on pt's DPR. Please advise. Thanks TNP

## 2018-03-27 NOTE — Telephone Encounter (Signed)
Sister has been advised. KW

## 2018-03-28 LAB — CYTOLOGY - PAP
Diagnosis: NEGATIVE
HPV: NOT DETECTED

## 2018-03-28 NOTE — Assessment & Plan Note (Signed)
Well controlled currently Continue lisinopril Check BMP F/u in 6 months

## 2018-03-28 NOTE — Assessment & Plan Note (Signed)
Previously well controlled Blood sugar log shows fairly good control No hypoglycemic episodes Again discussed diabetic diet and importance of exercise Recheck A1c Continue metformin at current dose Pending A1c, could consider dose titration if needed Up-to-date on screenings and vaccinations

## 2018-03-28 NOTE — Assessment & Plan Note (Signed)
Recheck kidney function today Discussed importance of good control of hypertension and diabetes and avoidance of nephrotoxic medications

## 2018-03-28 NOTE — Assessment & Plan Note (Signed)
Previously well controlled and currently asymptomatic Continue Synthroid at current dose Recheck TSH and dose adjust as indicated 

## 2018-04-23 ENCOUNTER — Ambulatory Visit
Admission: RE | Admit: 2018-04-23 | Discharge: 2018-04-23 | Disposition: A | Discharge status: Home / Self Care | Payer: Medicare Other | Source: Ambulatory Visit | Attending: Family Medicine | Admitting: Family Medicine

## 2018-04-23 DIAGNOSIS — Z1231 Encounter for screening mammogram for malignant neoplasm of breast: Secondary | ICD-10-CM | POA: Insufficient documentation

## 2018-04-23 DIAGNOSIS — Z1239 Encounter for other screening for malignant neoplasm of breast: Secondary | ICD-10-CM

## 2018-04-24 ENCOUNTER — Ambulatory Visit: Payer: Medicare Other | Admitting: Anesthesiology

## 2018-04-24 ENCOUNTER — Ambulatory Visit
Admission: RE | Admit: 2018-04-24 | Discharge: 2018-04-24 | Disposition: A | Discharge status: Home / Self Care | Payer: Medicare Other | Source: Ambulatory Visit | Attending: Gastroenterology | Admitting: Gastroenterology

## 2018-04-24 ENCOUNTER — Encounter
Admission: RE | Disposition: A | Discharge status: Home / Self Care | Payer: Self-pay | Source: Ambulatory Visit | Attending: Gastroenterology

## 2018-04-24 DIAGNOSIS — Z79899 Other long term (current) drug therapy: Secondary | ICD-10-CM | POA: Insufficient documentation

## 2018-04-24 DIAGNOSIS — E7800 Pure hypercholesterolemia, unspecified: Secondary | ICD-10-CM | POA: Insufficient documentation

## 2018-04-24 DIAGNOSIS — N182 Chronic kidney disease, stage 2 (mild): Secondary | ICD-10-CM | POA: Diagnosis not present

## 2018-04-24 DIAGNOSIS — D649 Anemia, unspecified: Secondary | ICD-10-CM | POA: Insufficient documentation

## 2018-04-24 DIAGNOSIS — K5730 Diverticulosis of large intestine without perforation or abscess without bleeding: Secondary | ICD-10-CM | POA: Diagnosis not present

## 2018-04-24 DIAGNOSIS — Z7951 Long term (current) use of inhaled steroids: Secondary | ICD-10-CM | POA: Diagnosis not present

## 2018-04-24 DIAGNOSIS — Z7984 Long term (current) use of oral hypoglycemic drugs: Secondary | ICD-10-CM | POA: Diagnosis not present

## 2018-04-24 DIAGNOSIS — Z1211 Encounter for screening for malignant neoplasm of colon: Secondary | ICD-10-CM | POA: Diagnosis not present

## 2018-04-24 DIAGNOSIS — I10 Essential (primary) hypertension: Secondary | ICD-10-CM | POA: Insufficient documentation

## 2018-04-24 DIAGNOSIS — E119 Type 2 diabetes mellitus without complications: Secondary | ICD-10-CM | POA: Diagnosis not present

## 2018-04-24 DIAGNOSIS — K219 Gastro-esophageal reflux disease without esophagitis: Secondary | ICD-10-CM | POA: Insufficient documentation

## 2018-04-24 DIAGNOSIS — I129 Hypertensive chronic kidney disease with stage 1 through stage 4 chronic kidney disease, or unspecified chronic kidney disease: Secondary | ICD-10-CM | POA: Diagnosis not present

## 2018-04-24 DIAGNOSIS — Z79890 Hormone replacement therapy (postmenopausal): Secondary | ICD-10-CM | POA: Diagnosis not present

## 2018-04-24 DIAGNOSIS — E039 Hypothyroidism, unspecified: Secondary | ICD-10-CM | POA: Diagnosis not present

## 2018-04-24 DIAGNOSIS — E1122 Type 2 diabetes mellitus with diabetic chronic kidney disease: Secondary | ICD-10-CM | POA: Diagnosis not present

## 2018-04-24 DIAGNOSIS — K5790 Diverticulosis of intestine, part unspecified, without perforation or abscess without bleeding: Secondary | ICD-10-CM | POA: Diagnosis not present

## 2018-04-24 HISTORY — DX: Bronchitis, not specified as acute or chronic: J40

## 2018-04-24 HISTORY — PX: COLONOSCOPY WITH PROPOFOL: SHX5780

## 2018-04-24 LAB — GLUCOSE, CAPILLARY: GLUCOSE-CAPILLARY: 97 mg/dL (ref 70–99)

## 2018-04-24 SURGERY — COLONOSCOPY WITH PROPOFOL
Anesthesia: General

## 2018-04-24 MED ORDER — PROPOFOL 10 MG/ML IV BOLUS
INTRAVENOUS | Status: AC
  Filled 2018-04-24: qty 20

## 2018-04-24 MED ORDER — PROPOFOL 500 MG/50ML IV EMUL
INTRAVENOUS | Status: AC
  Filled 2018-04-24: qty 50

## 2018-04-24 MED ORDER — LIDOCAINE HCL (PF) 2 % IJ SOLN
INTRAMUSCULAR | Status: AC
  Filled 2018-04-24: qty 10

## 2018-04-24 MED ORDER — PROPOFOL 10 MG/ML IV BOLUS
INTRAVENOUS | Status: DC | PRN
  Administered 2018-04-24: 40 mg via INTRAVENOUS
  Administered 2018-04-24 (×2): 100 mg via INTRAVENOUS

## 2018-04-24 MED ORDER — LIDOCAINE HCL (CARDIAC) PF 100 MG/5ML IV SOSY
PREFILLED_SYRINGE | INTRAVENOUS | Status: DC | PRN
  Administered 2018-04-24: 100 mg via INTRAVENOUS

## 2018-04-24 MED ORDER — SODIUM CHLORIDE 0.9 % IV SOLN
INTRAVENOUS | Status: DC
  Administered 2018-04-24: 13:00:00 via INTRAVENOUS

## 2018-04-24 MED ORDER — PROPOFOL 500 MG/50ML IV EMUL
INTRAVENOUS | Status: DC | PRN
  Administered 2018-04-24: 120 ug/kg/min via INTRAVENOUS

## 2018-04-24 NOTE — Transfer of Care (Signed)
Immediate Anesthesia Transfer of Care Note  Patient: Emma Fletcher  Procedure(s) Performed: Procedure(s): COLONOSCOPY WITH PROPOFOL (N/A)  Patient Location: PACU and Endoscopy Unit  Anesthesia Type:General  Level of Consciousness: sedated  Airway & Oxygen Therapy: Patient Spontanous Breathing and Patient connected to nasal cannula oxygen  Post-op Assessment: Report given to RN and Post -op Vital signs reviewed and stable  Post vital signs: Reviewed and stable  Last Vitals:  Vitals:   04/24/18 1244 04/24/18 1325  BP: (!) 150/89 122/73  Pulse: 70 80  Resp: 20 18  Temp: (!) 36.1 C (!) 35.9 C  SpO2: 100% 100%    Complications: No apparent anesthesia complications

## 2018-04-24 NOTE — Op Note (Signed)
[facility] Gastroenterology Patient Name: Emma Fletcher Procedure Date: 04/24/2018 12:40 PM MRN: [id number] Account #: [id number] Date of Birth: [birth year] Admit Type: Outpatient Age: 62 Room: [facility] ENDO ROOM 1 Gender: Female Note Status: Finalized Procedure:            Colonoscopy Indications:          Screening for colorectal malignant neoplasm Providers:            Wyline Mood MD, MD Referring MD:         Marzella Schlein. Bacigalupo (Referring MD) Medicines:            Monitored Anesthesia Care Complications:        No immediate complications. Procedure:            Pre-Anesthesia Assessment:                       - Prior to the procedure, a History and Physical was                        performed, and patient medications, allergies and                        sensitivities were reviewed. The patient's tolerance of                        previous anesthesia was reviewed.                       - The risks and benefits of the procedure and the                        sedation options and risks were discussed with the                        patient. All questions were answered and informed                        consent was obtained.                       - After reviewing the risks and benefits, the patient                        was deemed in satisfactory condition to undergo the                        procedure.                       - ASA Grade Assessment: II - A patient with mild                        systemic disease.                       After obtaining informed consent, the colonoscope was                        passed under direct vision. Throughout the procedure,                        the  patient's blood pressure, pulse, and oxygen                        saturations were monitored continuously. The                        Colonoscope was introduced through the anus and                        advanced to the the cecum, identified by the   appendiceal orifice, IC valve and transillumination.                        The colonoscopy was performed with ease. The patient                        tolerated the procedure well. The quality of the bowel                        preparation was good. Findings:      A few small-mouthed diverticula were found in the sigmoid colon.      The exam was otherwise without abnormality on direct and retroflexion       views. Impression:           - No specimens collected. Recommendation:       - Discharge patient to home (with escort).                       - Resume previous diet.                       - Continue present medications.                       - Repeat colonoscopy in 10 years for screening purposes. Procedure Code(s):    --- Professional ---                       [phone], Colonoscopy, flexible; diagnostic, including                        collection of specimen(s) by brushing or washing, when                        performed (separate procedure) Diagnosis Code(s):    --- Professional ---                       Z12.11, Encounter for screening for malignant neoplasm                        of colon CPT copyright 2018 American Medical Association. All rights reserved. The codes documented in this report are preliminary and upon coder review may  be revised to meet current compliance requirements. Wyline Mood, MD Wyline Mood MD, MD 04/24/2018 1:22:19 PM This report has been signed electronically. Number of Addenda: 0 Note Initiated On: 04/24/2018 12:40 PM Scope Withdrawal Time: 0 hours 11 minutes 53 seconds  Total Procedure Duration: 0 hours 16 minutes 19 seconds       [facility]

## 2018-04-24 NOTE — Anesthesia Post-op Follow-up Note (Signed)
Anesthesia QCDR form completed.        

## 2018-04-24 NOTE — H&P (Signed)
Jonathon Bellows, MD [address], [place], [place], [place], [address] [address], [place], [place], [place], [address] Phone: [phone]  Fax: [phone]  Primary Care Physician:  Virginia Crews, MD   Pre-Procedure History & Physical: HPI:  Emma Fletcher is a 62 y.o. female is here for an colonoscopy.   Past Medical History:  Diagnosis Date  . Anemia   . Cardiomyopathy secondary   . Developmental delay   . Diabetes mellitus without complication ([place])   . Edema   . Hypercholesterolemia   . Unspecified essential hypertension     Past Surgical History:  Procedure Laterality Date  . none      Prior to Admission medications   Medication Sig Start Date End Date Taking? Authorizing Provider  Blood Glucose Monitoring Suppl (ONE TOUCH ULTRA 2) w/Device KIT Use as directed to check blood sugar once daily 03/26/18  Yes Bacigalupo, Dionne Bucy, MD  budesonide-formoterol [facility]) 160-4.5 MCG/ACT inhaler Inhale 2 puffs into the lungs 2 (two) times daily.   Yes [provider]  Cholecalciferol (VITAMIN D3) 5000 units CAPS Take 1 capsule (5,000 Units total) by mouth daily. 03/26/18  Yes Bacigalupo, Dionne Bucy, MD  furosemide (LASIX) 20 MG tablet Take 1 tablet (20 mg total) by mouth daily. 11/05/17  Yes Bacigalupo, Dionne Bucy, MD  glucose blood (ONE TOUCH ULTRA TEST) test strip Use as instructed to check blood glucose daily 03/26/18  Yes Bacigalupo, Dionne Bucy, MD  lansoprazole (PREVACID) 30 MG capsule Take 1 capsule (30 mg total) by mouth daily. 11/05/17  Yes Bacigalupo, Dionne Bucy, MD  levothyroxine (SYNTHROID, LEVOTHROID) 100 MCG tablet Take 1 tablet (100 mcg total) by mouth daily. 11/05/17  Yes Bacigalupo, Dionne Bucy, MD  lisinopril (PRINIVIL,ZESTRIL) 20 MG tablet Take 1 tablet (20 mg total) by mouth daily. 11/05/17  Yes Bacigalupo, Dionne Bucy, MD  metFORMIN (GLUCOPHAGE-XR) 500 MG 24 hr tablet Take 2 tablets (1,000 mg total) by mouth every evening. 11/05/17  Yes Bacigalupo, Dionne Bucy, MD  Multiple Vitamins-Minerals (ONE-A-DAY WOMENS 50 PLUS PO) Take 1 tablet by mouth daily.   Yes [provider]  Jonetta Speak LANCETS 81E MISC Use as directed to check blood glucose daily 03/26/18  Yes Bacigalupo, Dionne Bucy, MD  potassium chloride (K-DUR,KLOR-CON) 10 MEQ tablet Take 1 tablet (10 mEq total) by mouth daily. 11/05/17  Yes Bacigalupo, Dionne Bucy, MD  SPIRIVA HANDIHALER 18 MCG inhalation capsule Place 18 mcg into inhaler and inhale daily.  07/19/15  Yes [provider]  vitamin C (ASCORBIC ACID) 500 MG tablet Take 500 mg by mouth daily.   Yes [provider]  Choline Fenofibrate (FENOFIBRIC ACID) 135 MG CPDR Take 135 mg by mouth daily.  07/19/15   [provider]  Ferrous Sulfate (IRON) 325 (65 Fe) MG TABS Take 2 tablets by mouth daily.    [provider]  fluticasone (FLONASE) 50 MCG/ACT nasal spray Place 2 sprays into both nostrils daily. Patient not taking: Reported on 03/26/2018 09/20/17   Virginia Crews, MD    Allergies as of 03/26/2018  . (No Known Allergies)    Family History  Problem Relation Age of Onset  . Diabetes Mother   . Hypertension Mother   . Stroke Mother   . Diabetes Father   . Coronary artery disease Father   . Breast cancer Maternal Aunt   . Lung cancer Maternal Uncle   . Prostate cancer Paternal Uncle   . Uterine cancer Other  Social History   Socioeconomic History  . Marital status: Single    Spouse name: Not on file  . Number of children: 0  . Years of education: Not on file  . Highest education level: Not on file  Occupational History  . Occupation: Part time works in [employer]  . Financial resource strain: Not on file  . Food insecurity:    Worry: Not on file    Inability: Not on file  . Transportation needs:    Medical: Not on file    Non-medical: Not on file  Tobacco Use  . Smoking status: Never Smoker  . Smokeless tobacco: Never Used  Substance and Sexual  Activity  . Alcohol use: No    Alcohol/week: 0.0 standard drinks  . Drug use: No  . Sexual activity: Not on file  Lifestyle  . Physical activity:    Days per week: Not on file    Minutes per session: Not on file  . Stress: Not on file  Relationships  . Social connections:    Talks on phone: Not on file    Gets together: Not on file    Attends religious service: Not on file    Active member of club or organization: Not on file    Attends meetings of clubs or organizations: Not on file    Relationship status: Not on file  . Intimate partner violence:    Fear of current or ex partner: Not on file    Emotionally abused: Not on file    Physically abused: Not on file    Forced sexual activity: Not on file  Other Topics Concern  . Not on file  Social History Narrative  . Not on file    Review of Systems: See HPI, otherwise negative ROS  Physical Exam: LMP  (LMP Unknown)  General:   Alert,  pleasant and cooperative in NAD Head:  Normocephalic and atraumatic. Neck:  Supple; no masses or thyromegaly. Lungs:  Clear throughout to auscultation, normal respiratory effort.    Heart:  +S1, +S2, Regular rate and rhythm, No edema. Abdomen:  Soft, nontender and nondistended. Normal bowel sounds, without guarding, and without rebound.   Neurologic:  Alert and  oriented x4;  grossly normal neurologically.  Impression/Plan: Emma Fletcher is here for an colonoscopy to be performed for Screening colonoscopy average risk   Risks, benefits, limitations, and alternatives regarding  colonoscopy have been reviewed with the patient.  Questions have been answered.  All parties agreeable.   Jonathon Bellows, MD  04/24/2018, 12:43 PM

## 2018-04-24 NOTE — Anesthesia Preprocedure Evaluation (Addendum)
Anesthesia Evaluation  Patient identified by MRN, date of birth, ID band Patient awake    Reviewed: Allergy & Precautions, H&P , NPO status , reviewed documented beta blocker date and time   Airway Mallampati: II  TM Distance: >3 FB Neck ROM: full    Dental  (+) Lower Dentures, Partial Upper, Missing   Pulmonary    Pulmonary exam normal        Cardiovascular hypertension, Normal cardiovascular exam  2017 Echo: Study Conclusions  - Procedure narrative: Transthoracic echocardiography. Image   quality was suboptimal. The study was technically difficult, as a   result of poor acoustic windows and poor sound wave transmission. - Left ventricle: The cavity size was normal. Systolic function was   normal. The estimated ejection fraction was in the range of 55%   to 65%. Wall motion was normal; there were no regional wall   motion abnormalities. Left ventricular diastolic function   parameters were normal. - Left atrium: The atrium was mildly dilated. - Right ventricle: Systolic function was normal. - Pulmonary arteries: Systolic pressure was within the normal   range.   Neuro/Psych Developmental delay   GI/Hepatic GERD  Medicated and Controlled,  Endo/Other  diabetesHypothyroidism   Renal/GU Renal disease     Musculoskeletal   Abdominal   Peds  Hematology  (+) Blood dyscrasia, anemia ,   Anesthesia Other Findings Past Medical History: No date: Anemia No date: Cardiomyopathy secondary No date: Developmental delay No date: Diabetes mellitus without complication (HCC) No date: Edema No date: Hypercholesterolemia No date: Unspecified essential hypertension  Past Surgical History: No date: none     Reproductive/Obstetrics                            Anesthesia Physical Anesthesia Plan  ASA: III  Anesthesia Plan: General   Post-op Pain Management:    Induction: Intravenous  PONV  Risk Score and Plan: 3 and Treatment may vary due to age or medical condition and TIVA  Airway Management Planned: Nasal Cannula and Natural Airway  Additional Equipment:   Intra-op Plan:   Post-operative Plan:   Informed Consent: I have reviewed the patients History and Physical, chart, labs and discussed the procedure including the risks, benefits and alternatives for the proposed anesthesia with the patient or authorized representative who has indicated his/her understanding and acceptance.   Dental Advisory Given  Plan Discussed with:   Anesthesia Plan Comments:        Anesthesia Quick Evaluation

## 2018-04-25 ENCOUNTER — Encounter: Payer: Self-pay | Admitting: Gastroenterology

## 2018-04-26 NOTE — Anesthesia Postprocedure Evaluation (Signed)
Anesthesia Post Note  Patient: Emma Fletcher  Procedure(s) Performed: COLONOSCOPY WITH PROPOFOL (N/A )  Patient location during evaluation: Endoscopy Anesthesia Type: General Level of consciousness: awake and alert Pain management: pain level controlled Vital Signs Assessment: post-procedure vital signs reviewed and stable Respiratory status: spontaneous breathing, nonlabored ventilation and respiratory function stable Cardiovascular status: blood pressure returned to baseline and stable Postop Assessment: no apparent nausea or vomiting Anesthetic complications: no     Last Vitals:  Vitals:   04/24/18 1335 04/24/18 1345  BP: (!) 148/79 135/77  Pulse: 76 71  Resp: (!) 23 (!) 21  Temp:    SpO2: 100% 100%    Last Pain:  Vitals:   04/24/18 1345  TempSrc:   PainSc: 0-No pain                 Christia Reading

## 2018-05-13 ENCOUNTER — Encounter: Payer: Self-pay | Admitting: Podiatry

## 2018-05-13 ENCOUNTER — Ambulatory Visit (INDEPENDENT_AMBULATORY_CARE_PROVIDER_SITE_OTHER): Payer: Medicare Other | Admitting: Podiatry

## 2018-05-13 ENCOUNTER — Ambulatory Visit: Payer: Medicare Other

## 2018-05-13 DIAGNOSIS — M79676 Pain in unspecified toe(s): Secondary | ICD-10-CM | POA: Diagnosis not present

## 2018-05-13 DIAGNOSIS — S93601A Unspecified sprain of right foot, initial encounter: Secondary | ICD-10-CM | POA: Diagnosis not present

## 2018-05-13 DIAGNOSIS — E119 Type 2 diabetes mellitus without complications: Secondary | ICD-10-CM

## 2018-05-13 DIAGNOSIS — B351 Tinea unguium: Secondary | ICD-10-CM | POA: Diagnosis not present

## 2018-05-13 NOTE — Progress Notes (Addendum)
Complaint:  Visit Type: Patient returns to my office for continued preventative foot care services. Complaint: Patient states" my nails have grown long and thick and become painful to walk and wear shoes" Patient has been diagnosed with DM with no foot complications. The patient presents for preventative foot care services. No changes to ROS.  Patient has painful callus left metabase.  Painful area over the top of the outside right foot.  Podiatric Exam: Vascular: dorsalis pedis and posterior tibial pulses are palpable bilateral. Capillary return is immediate. Temperature gradient is WNL. Skin turgor WNL  Sensorium: Normal Semmes Weinstein monofilament test. Normal tactile sensation bilaterally. Nail Exam: Pt has elongated ingrown toenails  B/L. Ulcer Exam: There is no evidence of ulcer or pre-ulcerative changes or infection. Orthopedic Exam: Muscle tone and strength are WNL. No limitations in general ROM. No crepitus or effusions noted. Foot type and digits show no abnormalities. Bony prominences are unremarkable. Painful bone over 5th met right foot.. Skin: No Porokeratosis. No infection or ulcers.  Porokeratosis 5th metabase left foot.  Diagnosis:  Onychomycosis  Hallux  B/L, Pain in right toe, pain in left toes Foot Sprain right foot.  Treatment & Plan Procedures and Treatment: Consent by patient was obtained for treatment procedures.   Debridement of mycotic and hypertrophic toenails, 1 through 5 bilateral and clearing of subungual debris. No ulceration, no infection noted. Injection right foot.  Injection therapy using 1.0 cc. Of 2% xylocaine( 20 mg.) plus 1 cc. of kenalog-la ( 10 mg) plus 1/2 cc. of dexamethazone phosphate ( 2 mg).  X-ray right foot reveals no bony pathology.  Dispense compression sock. Return Visit-Office Procedure: Patient instructed to return to the office for a follow up visit prn  for continued evaluation and treatment. ABN signed for 2019.    Gardiner Barefoot DPM

## 2018-05-15 ENCOUNTER — Other Ambulatory Visit: Payer: Self-pay | Admitting: Family Medicine

## 2018-05-22 ENCOUNTER — Other Ambulatory Visit: Payer: Self-pay | Admitting: *Deleted

## 2018-05-22 MED ORDER — LISINOPRIL 20 MG PO TABS: 20.0000 mg | ORAL_TABLET | Freq: Every day | ORAL | 2 refills | Status: DC

## 2018-05-22 MED ORDER — METFORMIN HCL ER 500 MG PO TB24: 1000.0000 mg | ORAL_TABLET | Freq: Every evening | ORAL | 3 refills | Status: DC

## 2018-05-22 NOTE — Telephone Encounter (Signed)
Patient requesting refills for lisinopril (PRINIVIL,ZESTRIL) 20 MG tablet and metFORMIN (GLUCOPHAGE-XR) 500 MG 24 hr tablet be sent to [facility] mail order pharmacy.

## 2018-05-24 DIAGNOSIS — I350 Nonrheumatic aortic (valve) stenosis: Secondary | ICD-10-CM | POA: Diagnosis not present

## 2018-05-27 ENCOUNTER — Ambulatory Visit: Payer: Medicare Other | Admitting: Family Medicine

## 2018-05-28 DIAGNOSIS — I350 Nonrheumatic aortic (valve) stenosis: Secondary | ICD-10-CM | POA: Diagnosis not present

## 2018-05-28 DIAGNOSIS — N289 Disorder of kidney and ureter, unspecified: Secondary | ICD-10-CM | POA: Diagnosis not present

## 2018-06-04 DIAGNOSIS — I350 Nonrheumatic aortic (valve) stenosis: Secondary | ICD-10-CM | POA: Diagnosis not present

## 2018-06-04 DIAGNOSIS — J988 Other specified respiratory disorders: Secondary | ICD-10-CM | POA: Diagnosis not present

## 2018-06-06 ENCOUNTER — Ambulatory Visit (INDEPENDENT_AMBULATORY_CARE_PROVIDER_SITE_OTHER): Payer: Medicare Other | Admitting: Family Medicine

## 2018-06-06 ENCOUNTER — Encounter: Payer: Self-pay | Admitting: Family Medicine

## 2018-06-06 VITALS — BP 131/84 | HR 71 | Temp 98.0°F | Wt 220.0 lb

## 2018-06-06 DIAGNOSIS — J0140 Acute pansinusitis, unspecified: Secondary | ICD-10-CM

## 2018-06-06 MED ORDER — DOXYCYCLINE HYCLATE 100 MG PO TABS: 100.0000 mg | ORAL_TABLET | Freq: Two times a day (BID) | ORAL | 0 refills | Status: AC

## 2018-06-06 NOTE — Progress Notes (Signed)
Patient: Emma Fletcher Female    DOB: 1955/10/22   62 y.o.   MRN: [id number] Visit Date: 06/06/2018  Today's Provider: Lavon Paganini, MD   Chief Complaint  Patient presents with  . URI   Subjective:     URI   The current episode started in the past 7 days. The problem has been unchanged. Associated symptoms include congestion, coughing, headaches, sneezing and wheezing.  No fevers, abd pain, N/V/D.  No Known Allergies   Current Outpatient Medications:  .  Blood Glucose Monitoring Suppl (ONE TOUCH ULTRA 2) w/Device KIT, Use as directed to check blood sugar once daily, Disp: 1 each, Rfl: 0 .  budesonide-formoterol (SYMBICORT) 160-4.5 MCG/ACT inhaler, Inhale 2 puffs into the lungs 2 (two) times daily., Disp: , Rfl:  .  Cholecalciferol (VITAMIN D3) 5000 units CAPS, Take 1 capsule (5,000 Units total) by mouth daily., Disp: 90 capsule, Rfl: 3 .  Choline Fenofibrate (FENOFIBRIC ACID) 135 MG CPDR, Take 135 mg by mouth daily. , Disp: , Rfl:  .  Ferrous Sulfate (IRON) 325 (65 Fe) MG TABS, Take 2 tablets by mouth daily., Disp: , Rfl:  .  fluticasone (FLONASE) 50 MCG/ACT nasal spray, Place 2 sprays into both nostrils daily., Disp: 16 g, Rfl: 6 .  furosemide (LASIX) 20 MG tablet, Take 1 tablet (20 mg total) by mouth daily., Disp: 90 tablet, Rfl: 2 .  glucose blood (ONE TOUCH ULTRA TEST) test strip, Use as instructed to check blood glucose daily, Disp: 100 each, Rfl: 3 .  lansoprazole (PREVACID) 30 MG capsule, Take 1 capsule (30 mg total) by mouth daily., Disp: 90 capsule, Rfl: 2 .  levothyroxine (SYNTHROID, LEVOTHROID) 100 MCG tablet, TAKE 1 TABLET (100 MCG TOTAL) BY MOUTH DAILY., Disp: 90 tablet, Rfl: 0 .  lisinopril (PRINIVIL,ZESTRIL) 20 MG tablet, Take 1 tablet (20 mg total) by mouth daily., Disp: 90 tablet, Rfl: 2 .  metFORMIN (GLUCOPHAGE-XR) 500 MG 24 hr tablet, Take 2 tablets (1,000 mg total) by mouth every evening., Disp: 180 tablet, Rfl: 3 .  Multiple Vitamins-Minerals  (ONE-A-DAY WOMENS 50 PLUS PO), Take 1 tablet by mouth daily., Disp: , Rfl:  .  ONETOUCH DELICA LANCETS 57S MISC, Use as directed to check blood glucose daily, Disp: 100 each, Rfl: 5 .  potassium chloride (K-DUR,KLOR-CON) 10 MEQ tablet, Take 1 tablet (10 mEq total) by mouth daily., Disp: 90 tablet, Rfl: 2 .  SPIRIVA HANDIHALER 18 MCG inhalation capsule, Place 18 mcg into inhaler and inhale daily. , Disp: , Rfl:  .  vitamin C (ASCORBIC ACID) 500 MG tablet, Take 500 mg by mouth daily., Disp: , Rfl:   Review of Systems  Constitutional: Negative.   HENT: Positive for congestion, sinus pressure and sneezing.   Respiratory: Positive for cough and wheezing.   Cardiovascular: Negative.   Musculoskeletal: Negative.   Neurological: Positive for headaches.    Social History   Tobacco Use  . Smoking status: Never Smoker  . Smokeless tobacco: Never Used  Substance Use Topics  . Alcohol use: No    Alcohol/week: 0.0 standard drinks      Objective:   BP 131/84 (BP Location: Left Arm, Patient Position: Sitting, Cuff Size: Large)   Pulse 71   Temp 98 F (36.7 C) (Oral)   Wt 220 lb (99.8 kg)   LMP  (LMP Unknown)   SpO2 99%   BMI 35.51 kg/m  Vitals:   06/06/18 1053  BP: 131/84  Pulse: 71  Temp: 98  F (36.7 C)  TempSrc: Oral  SpO2: 99%  Weight: 220 lb (99.8 kg)     Physical Exam Vitals signs reviewed.  Constitutional:      General: She is not in acute distress.    Appearance: Normal appearance. She is obese.  HENT:     Head: Normocephalic and atraumatic.     Right Ear: Tympanic membrane, ear canal and external ear normal.     Left Ear: Tympanic membrane, ear canal and external ear normal.     Nose: Congestion and rhinorrhea present.     Right Sinus: Maxillary sinus tenderness and frontal sinus tenderness present.     Left Sinus: Maxillary sinus tenderness and frontal sinus tenderness present.     Mouth/Throat:     Mouth: Mucous membranes are moist.     Pharynx: Oropharynx is  clear. No oropharyngeal exudate or posterior oropharyngeal erythema.  Eyes:     General: No scleral icterus.       Right eye: No discharge.        Left eye: No discharge.     Conjunctiva/sclera: Conjunctivae normal.     Pupils: Pupils are equal, round, and reactive to light.  Neck:     Musculoskeletal: Neck supple.  Cardiovascular:     Rate and Rhythm: Normal rate and regular rhythm.     Pulses: Normal pulses.     Heart sounds: Normal heart sounds. No murmur.  Pulmonary:     Effort: Pulmonary effort is normal. No respiratory distress.     Breath sounds: Normal breath sounds. No wheezing or rhonchi.  Abdominal:     General: There is no distension.     Palpations: Abdomen is soft.     Tenderness: There is no abdominal tenderness.  Musculoskeletal:     Right lower leg: No edema.     Left lower leg: No edema.  Lymphadenopathy:     Cervical: No cervical adenopathy.  Skin:    General: Skin is warm and dry.     Capillary Refill: Capillary refill takes less than 2 seconds.     Findings: No rash.  Neurological:     Mental Status: She is alert. Mental status is at baseline.  Psychiatric:        Mood and Affect: Mood normal.        Behavior: Behavior normal.         Assessment & Plan   1. Acute non-recurrent pansinusitis - symptoms and exam c/w sinusitis   - no evidence of AOM, CAP, strep pharyngitis, or other infection - given duration of symptoms, suspect bacterial etiology - discussed that she can wait a few more days to start abx to see if symptoms resolve with conservative management with flonase, mucinex - will treat with Doxycycline x7d - discussed symptomatic management (flonase, decongestants, etc), natural course, and return precautions      Meds ordered this encounter  Medications  . doxycycline (VIBRA-TABS) 100 MG tablet    Sig: Take 1 tablet (100 mg total) by mouth 2 (two) times daily for 7 days.    Dispense:  14 tablet    Refill:  0     Return if  symptoms worsen or fail to improve.   The entirety of the information documented in the History of Present Illness, Review of Systems and Physical Exam were personally obtained by me. Portions of this information were initially documented by Tiburcio Pea and Hurman Horn, CMA and reviewed by me for thoroughness and accuracy.  Virginia Crews, MD, MPH [facility] 06/06/2018 12:02 PM

## 2018-06-06 NOTE — Patient Instructions (Signed)
Sinusitis, Adult  Sinusitis is inflammation of your sinuses. Sinuses are hollow spaces in the bones around your face. Your sinuses are located:   Around your eyes.   In the middle of your forehead.   Behind your nose.   In your cheekbones.  Mucus normally drains out of your sinuses. When your nasal tissues become inflamed or swollen, mucus can become trapped or blocked. This allows bacteria, viruses, and fungi to grow, which leads to infection. Most infections of the sinuses are caused by a virus.  Sinusitis can develop quickly. It can last for up to 4 weeks (acute) or for more than 12 weeks (chronic). Sinusitis often develops after a cold.  What are the causes?  This condition is caused by anything that creates swelling in the sinuses or stops mucus from draining. This includes:   Allergies.   Asthma.   Infection from bacteria or viruses.   Deformities or blockages in your nose or sinuses.   Abnormal growths in the nose (nasal polyps).   Pollutants, such as chemicals or irritants in the air.   Infection from fungi (rare).  What increases the risk?  You are more likely to develop this condition if you:   Have a weak body defense system (immune system).   Do a lot of swimming or diving.   Overuse nasal sprays.   Smoke.  What are the signs or symptoms?  The main symptoms of this condition are pain and a feeling of pressure around the affected sinuses. Other symptoms include:   Stuffy nose or congestion.   Thick drainage from your nose.   Swelling and warmth over the affected sinuses.   Headache.   Upper toothache.   A cough that may get worse at night.   Extra mucus that collects in the throat or the back of the nose (postnasal drip).   Decreased sense of smell and taste.   Fatigue.   A fever.   Sore throat.   Bad breath.  How is this diagnosed?  This condition is diagnosed based on:   Your symptoms.   Your medical history.   A physical exam.   Tests to find out if your condition is  acute or chronic. This may include:  ? Checking your nose for nasal polyps.  ? Viewing your sinuses using a device that has a light (endoscope).  ? Testing for allergies or bacteria.  ? Imaging tests, such as an MRI or CT scan.  In rare cases, a bone biopsy may be done to rule out more serious types of fungal sinus disease.  How is this treated?  Treatment for sinusitis depends on the cause and whether your condition is chronic or acute.   If caused by a virus, your symptoms should go away on their own within 10 days. You may be given medicines to relieve symptoms. They include:  ? Medicines that shrink swollen nasal passages (topical intranasal decongestants).  ? Medicines that treat allergies (antihistamines).  ? A spray that eases inflammation of the nostrils (topical intranasal corticosteroids).  ? Rinses that help get rid of thick mucus in your nose (nasal saline washes).   If caused by bacteria, your health care provider may recommend waiting to see if your symptoms improve. Most bacterial infections will get better without antibiotic medicine. You may be given antibiotics if you have:  ? A severe infection.  ? A weak immune system.   If caused by narrow nasal passages or nasal polyps, you may need   to have surgery.  Follow these instructions at home:  Medicines   Take, use, or apply over-the-counter and prescription medicines only as told by your health care provider. These may include nasal sprays.   If you were prescribed an antibiotic medicine, take it as told by your health care provider. Do not stop taking the antibiotic even if you start to feel better.  Hydrate and humidify     Drink enough fluid to keep your urine pale yellow. Staying hydrated will help to thin your mucus.   Use a cool mist humidifier to keep the humidity level in your home above 50%.   Inhale steam for 10-15 minutes, 3-4 times a day, or as told by your health care provider. You can do this in the bathroom while a hot shower is  running.   Limit your exposure to cool or dry air.  Rest   Rest as much as possible.   Sleep with your head raised (elevated).   Make sure you get enough sleep each night.  General instructions     Apply a warm, moist washcloth to your face 3-4 times a day or as told by your health care provider. This will help with discomfort.   Wash your hands often with soap and water to reduce your exposure to germs. If soap and water are not available, use hand sanitizer.   Do not smoke. Avoid being around people who are smoking (secondhand smoke).   Keep all follow-up visits as told by your health care provider. This is important.  Contact a health care provider if:   You have a fever.   Your symptoms get worse.   Your symptoms do not improve within 10 days.  Get help right away if:   You have a severe headache.   You have persistent vomiting.   You have severe pain or swelling around your face or eyes.   You have vision problems.   You develop confusion.   Your neck is stiff.   You have trouble breathing.  Summary   Sinusitis is soreness and inflammation of your sinuses. Sinuses are hollow spaces in the bones around your face.   This condition is caused by nasal tissues that become inflamed or swollen. The swelling traps or blocks the flow of mucus. This allows bacteria, viruses, and fungi to grow, which leads to infection.   If you were prescribed an antibiotic medicine, take it as told by your health care provider. Do not stop taking the antibiotic even if you start to feel better.   Keep all follow-up visits as told by your health care provider. This is important.  This information is not intended to replace advice given to you by your health care provider. Make sure you discuss any questions you have with your health care provider.  Document Released: 06/05/2005 Document Revised: 11/05/2017 Document Reviewed: 11/05/2017  Elsevier Interactive Patient Education  2019 [employer]

## 2018-06-20 ENCOUNTER — Other Ambulatory Visit: Payer: Medicare Other

## 2018-06-20 ENCOUNTER — Ambulatory Visit: Payer: Medicare Other | Admitting: Oncology

## 2018-07-02 ENCOUNTER — Telehealth: Payer: Self-pay | Admitting: *Deleted

## 2018-07-02 ENCOUNTER — Inpatient Hospital Stay (HOSPITAL_BASED_OUTPATIENT_CLINIC_OR_DEPARTMENT_OTHER): Payer: Medicare Other | Admitting: Oncology

## 2018-07-02 ENCOUNTER — Inpatient Hospital Stay: Payer: Medicare Other | Attending: Oncology

## 2018-07-02 ENCOUNTER — Encounter: Payer: Self-pay | Admitting: Oncology

## 2018-07-02 VITALS — BP 157/88 | HR 64 | Temp 98.1°F | Resp 18 | Wt 219.0 lb

## 2018-07-02 DIAGNOSIS — E871 Hypo-osmolality and hyponatremia: Secondary | ICD-10-CM | POA: Diagnosis not present

## 2018-07-02 DIAGNOSIS — E119 Type 2 diabetes mellitus without complications: Secondary | ICD-10-CM | POA: Diagnosis not present

## 2018-07-02 DIAGNOSIS — Z7984 Long term (current) use of oral hypoglycemic drugs: Secondary | ICD-10-CM | POA: Diagnosis not present

## 2018-07-02 DIAGNOSIS — E7800 Pure hypercholesterolemia, unspecified: Secondary | ICD-10-CM | POA: Insufficient documentation

## 2018-07-02 DIAGNOSIS — N189 Chronic kidney disease, unspecified: Secondary | ICD-10-CM | POA: Insufficient documentation

## 2018-07-02 DIAGNOSIS — I129 Hypertensive chronic kidney disease with stage 1 through stage 4 chronic kidney disease, or unspecified chronic kidney disease: Secondary | ICD-10-CM

## 2018-07-02 DIAGNOSIS — R944 Abnormal results of kidney function studies: Secondary | ICD-10-CM

## 2018-07-02 DIAGNOSIS — Z79899 Other long term (current) drug therapy: Secondary | ICD-10-CM

## 2018-07-02 DIAGNOSIS — R5383 Other fatigue: Secondary | ICD-10-CM | POA: Insufficient documentation

## 2018-07-02 DIAGNOSIS — E669 Obesity, unspecified: Secondary | ICD-10-CM

## 2018-07-02 DIAGNOSIS — D638 Anemia in other chronic diseases classified elsewhere: Secondary | ICD-10-CM

## 2018-07-02 DIAGNOSIS — J449 Chronic obstructive pulmonary disease, unspecified: Secondary | ICD-10-CM | POA: Insufficient documentation

## 2018-07-02 DIAGNOSIS — D649 Anemia, unspecified: Secondary | ICD-10-CM | POA: Insufficient documentation

## 2018-07-02 DIAGNOSIS — E039 Hypothyroidism, unspecified: Secondary | ICD-10-CM | POA: Insufficient documentation

## 2018-07-02 DIAGNOSIS — I10 Essential (primary) hypertension: Secondary | ICD-10-CM | POA: Insufficient documentation

## 2018-07-02 DIAGNOSIS — G4730 Sleep apnea, unspecified: Secondary | ICD-10-CM | POA: Insufficient documentation

## 2018-07-02 DIAGNOSIS — I429 Cardiomyopathy, unspecified: Secondary | ICD-10-CM | POA: Insufficient documentation

## 2018-07-02 DIAGNOSIS — M2550 Pain in unspecified joint: Secondary | ICD-10-CM | POA: Diagnosis not present

## 2018-07-02 DIAGNOSIS — I2510 Atherosclerotic heart disease of native coronary artery without angina pectoris: Secondary | ICD-10-CM

## 2018-07-02 LAB — IRON AND TIBC
Iron: 59 ug/dL (ref 28–170)
SATURATION RATIOS: 22 % (ref 10.4–31.8)
TIBC: 268 ug/dL (ref 250–450)
UIBC: 209 ug/dL

## 2018-07-02 LAB — BASIC METABOLIC PANEL
Anion gap: 9 (ref 5–15)
BUN: 19 mg/dL (ref 8–23)
CO2: 27 mmol/L (ref 22–32)
Calcium: 9.7 mg/dL (ref 8.9–10.3)
Chloride: 102 mmol/L (ref 98–111)
Creatinine, Ser: 0.85 mg/dL (ref 0.44–1.00)
GFR calc Af Amer: >60 mL/min (ref >60)
GFR calc non Af Amer: >60 mL/min (ref >60)
Glucose, Bld: 147 mg/dL — ABNORMAL HIGH (ref 70–99)
Potassium: 4.5 mmol/L (ref 3.5–5.1)
Sodium: 138 mmol/L (ref 135–145)

## 2018-07-02 LAB — CBC WITH DIFFERENTIAL/PLATELET
Abs Immature Granulocytes: 0.02 10*3/uL (ref 0.00–0.07)
BASOS ABS: 0 10*3/uL (ref 0.0–0.1)
Basophils Relative: 0 %
EOS PCT: 1 %
Eosinophils Absolute: 0.1 10*3/uL (ref 0.0–0.5)
HEMATOCRIT: 32.9 % — AB (ref 36.0–46.0)
HEMOGLOBIN: 10.5 g/dL — AB (ref 12.0–15.0)
Immature Granulocytes: 0 %
LYMPHS ABS: 1.4 10*3/uL (ref 0.7–4.0)
LYMPHS PCT: 21 %
MCH: 29.2 pg (ref 26.0–34.0)
MCHC: 31.9 g/dL (ref 30.0–36.0)
MCV: 91.4 fL (ref 80.0–100.0)
MONO ABS: 0.4 10*3/uL (ref 0.1–1.0)
MONOS PCT: 6 %
NRBC: 0 % (ref 0.0–0.2)
Neutro Abs: 5 10*3/uL (ref 1.7–7.7)
Neutrophils Relative %: 72 %
Platelets: 294 10*3/uL (ref 150–400)
RBC: 3.6 MIL/uL — ABNORMAL LOW (ref 3.87–5.11)
RDW: 12.2 % (ref 11.5–15.5)
WBC: 6.9 10*3/uL (ref 4.0–10.5)

## 2018-07-02 LAB — VITAMIN B12: Vitamin B-12: 249 pg/mL (ref 180–914)

## 2018-07-02 LAB — FERRITIN: Ferritin: 179 ng/mL (ref 11–307)

## 2018-07-02 NOTE — Telephone Encounter (Signed)
-----   Message from Creig Hines, MD sent at 07/02/2018  2:26 PM EST ----- b12 levels low normal. She needs to take po b12 and this needs follow up with pcp as she will not be seeing [place]. Thanks, Ovidio Kin

## 2018-07-02 NOTE — Telephone Encounter (Signed)
I called patient and her sister June answered. I told her I had an OTC medication that Dr Smith Robert would like for her to start taking. Sister said she would be glad to take the message and give to her sister. Instucted her to start taking Vit B12 1 daily. I mentioned that she will no longer be seeing Dr Smith Robert so, I will let her PCP (verified it was Dr Beryle Flock) know to follow up. Faxing info to her PCP. Added B12 to medication list.

## 2018-07-02 NOTE — Progress Notes (Signed)
Patient with normocytic anemia here today for follow up with labs. She states that she feels "great" and has no complaints at this time. She denies having any pain or shortness of breath.

## 2018-07-04 NOTE — Progress Notes (Deleted)
Retic arch Multi Myelo arch

## 2018-07-04 NOTE — Progress Notes (Deleted)
Arch TSH arch   

## 2018-07-04 NOTE — Progress Notes (Signed)
Hematology/Oncology Consult note [facility]  Telephone:([phone] [phone]  Patient Care Team: Virginia Crews, MD as PCP - General (Family Medicine)   Name of the patient: Emma Fletcher  [id number]  [birth year]   Date of visit: 07/04/18  Diagnosis- normocytic anemia likely due to chronic disease   Chief complaint/ Reason for visit- routine f/u of anemia  Heme/Onc history: patient is a 63 year old female with a past medical history significant for CKD, COPD, sleep apnea, type 2 diabetes obesity hypertension,and hypothyroidism amongst other medical problems.CMP from 06/21/2016 showed mildly elevated creatinine of 1.19. CBC showed white count of 8.5, H&H of 9.8/29.8 with an MCV of 87.9 and platelet count of 367. TSH was normal at 1.13. Free T3 andT4 were also within normal limits. She was seen by previous hematologist Dr. Bynum Bellows from [place] and was receiving procrit shots from his office every 2 months. Her last shot was in December 2017. Dr. Murvin Natal left that practice and she has not received any EPO shots since then.   Further workup of the anemia from March 2018 was as follows: CBC showed white count of 5.8, H&H of 9.8/29.2 and a platelet count of 297. CMP was significant for chronic hyponatremia with a sodium of 125, BUN of 27 and creatinine of 1. Ferritin was normal and iron studies are within normal limits. B12 and folate were within normal limits. ESR and CRP was within normal limits. Reticulocyte count was 0.6% which was low for the degree of anemia. Multiple myeloma panel did not reveal any monoclonal protein. Haptoglobin was normal at 171.  Interval history-overall she feels at her baseline.  Her appetite is good and weight is stable.  She has chronic mild fatigue and joint pain which is unchanged  ECOG PS- 1 Pain scale- 0 Opioid associated constipation- no  Review of systems- Review of Systems  Constitutional: Positive for  malaise/fatigue. Negative for chills, fever and weight loss.  HENT: Negative for congestion, ear discharge and nosebleeds.   Eyes: Negative for blurred vision.  Respiratory: Negative for cough, hemoptysis, sputum production, shortness of breath and wheezing.   Cardiovascular: Negative for chest pain, palpitations, orthopnea and claudication.  Gastrointestinal: Negative for abdominal pain, blood in stool, constipation, diarrhea, heartburn, melena, nausea and vomiting.  Genitourinary: Negative for dysuria, flank pain, frequency, hematuria and urgency.  Musculoskeletal: Negative for back pain, joint pain and myalgias.  Skin: Negative for rash.  Neurological: Negative for dizziness, tingling, focal weakness, seizures, weakness and headaches.  Endo/Heme/Allergies: Does not bruise/bleed easily.  Psychiatric/Behavioral: Negative for depression and suicidal ideas. The patient does not have insomnia.       No Known Allergies   Past Medical History:  Diagnosis Date  . Anemia   . Bronchitis   . Cardiomyopathy secondary   . Developmental delay   . Diabetes mellitus without complication ([place])   . Edema   . Hypercholesterolemia   . Unspecified essential hypertension      Past Surgical History:  Procedure Laterality Date  . COLONOSCOPY    . COLONOSCOPY WITH PROPOFOL N/A 04/24/2018   Procedure: COLONOSCOPY WITH PROPOFOL;  Surgeon: Jonathon Bellows, MD;  Location: [facility] ENDOSCOPY;  Service: Gastroenterology;  Laterality: N/A;  . none      Social History   Socioeconomic History  . Marital status: Single    Spouse name: Not on file  . Number of children: 0  . Years of education: Not on file  . Highest education level: Not on file  Occupational History  . Occupation: Part time works in [employer]  . Financial resource strain: Not on file  . Food insecurity:    Worry: Not on file    Inability: Not on file  . Transportation needs:    Medical: Not on file    Non-medical: Not  on file  Tobacco Use  . Smoking status: Never Smoker  . Smokeless tobacco: Never Used  Substance and Sexual Activity  . Alcohol use: No    Alcohol/week: 0.0 standard drinks  . Drug use: No  . Sexual activity: Not on file  Lifestyle  . Physical activity:    Days per week: Not on file    Minutes per session: Not on file  . Stress: Not on file  Relationships  . Social connections:    Talks on phone: Not on file    Gets together: Not on file    Attends religious service: Not on file    Active member of club or organization: Not on file    Attends meetings of clubs or organizations: Not on file    Relationship status: Not on file  . Intimate partner violence:    Fear of current or ex partner: Not on file    Emotionally abused: Not on file    Physically abused: Not on file    Forced sexual activity: Not on file  Other Topics Concern  . Not on file  Social History Narrative  . Not on file    Family History  Problem Relation Age of Onset  . Diabetes Mother   . Hypertension Mother   . Stroke Mother   . Diabetes Father   . Coronary artery disease Father   . Breast cancer Maternal Aunt   . Lung cancer Maternal Uncle   . Prostate cancer Paternal Uncle   . Uterine cancer Other      Current Outpatient Medications:  .  Blood Glucose Monitoring Suppl (ONE TOUCH ULTRA 2) w/Device KIT, Use as directed to check blood sugar once daily, Disp: 1 each, Rfl: 0 .  budesonide-formoterol (SYMBICORT) 160-4.5 MCG/ACT inhaler, Inhale 2 puffs into the lungs 2 (two) times daily., Disp: , Rfl:  .  Cholecalciferol (VITAMIN D3) 5000 units CAPS, Take 1 capsule (5,000 Units total) by mouth daily., Disp: 90 capsule, Rfl: 3 .  Choline Fenofibrate (FENOFIBRIC ACID) 135 MG CPDR, Take 135 mg by mouth daily. , Disp: , Rfl:  .  Ferrous Sulfate (IRON) 325 (65 Fe) MG TABS, Take 2 tablets by mouth daily., Disp: , Rfl:  .  fluticasone (FLONASE) 50 MCG/ACT nasal spray, Place 2 sprays into both nostrils daily.,  Disp: 16 g, Rfl: 6 .  furosemide (LASIX) 20 MG tablet, Take 1 tablet (20 mg total) by mouth daily., Disp: 90 tablet, Rfl: 2 .  glucose blood (ONE TOUCH ULTRA TEST) test strip, Use as instructed to check blood glucose daily, Disp: 100 each, Rfl: 3 .  lansoprazole (PREVACID) 30 MG capsule, Take 1 capsule (30 mg total) by mouth daily., Disp: 90 capsule, Rfl: 2 .  levothyroxine (SYNTHROID, LEVOTHROID) 100 MCG tablet, TAKE 1 TABLET (100 MCG TOTAL) BY MOUTH DAILY., Disp: 90 tablet, Rfl: 0 .  lisinopril (PRINIVIL,ZESTRIL) 20 MG tablet, Take 1 tablet (20 mg total) by mouth daily., Disp: 90 tablet, Rfl: 2 .  metFORMIN (GLUCOPHAGE-XR) 500 MG 24 hr tablet, Take 2 tablets (1,000 mg total) by mouth every evening., Disp: 180 tablet, Rfl: 3 .  Multiple Vitamins-Minerals (ONE-A-DAY WOMENS 50 PLUS PO),  Take 1 tablet by mouth daily., Disp: , Rfl:  .  ONETOUCH DELICA LANCETS 45G MISC, Use as directed to check blood glucose daily, Disp: 100 each, Rfl: 5 .  potassium chloride (K-DUR,KLOR-CON) 10 MEQ tablet, Take 1 tablet (10 mEq total) by mouth daily., Disp: 90 tablet, Rfl: 2 .  SPIRIVA HANDIHALER 18 MCG inhalation capsule, Place 18 mcg into inhaler and inhale daily. , Disp: , Rfl:  .  vitamin C (ASCORBIC ACID) 500 MG tablet, Take 500 mg by mouth daily., Disp: , Rfl:  .  vitamin B-12 (CYANOCOBALAMIN) 1000 MCG tablet, Take 1,000 mcg by mouth daily., Disp: , Rfl:   Physical exam:  Vitals:   07/02/18 1104  BP: (!) 157/88  Pulse: 64  Resp: 18  Temp: 98.1 F (36.7 C)  TempSrc: Tympanic  SpO2: 100%  Weight: 219 lb (99.3 kg)   Physical Exam Constitutional:      General: She is not in acute distress. HENT:     Head: Normocephalic and atraumatic.  Eyes:     Pupils: Pupils are equal, round, and reactive to light.  Neck:     Musculoskeletal: Normal range of motion.  Cardiovascular:     Rate and Rhythm: Normal rate and regular rhythm.     Heart sounds: Normal heart sounds.  Pulmonary:     Effort: Pulmonary  effort is normal.     Breath sounds: Normal breath sounds.  Abdominal:     General: Bowel sounds are normal.     Palpations: Abdomen is soft.  Skin:    General: Skin is warm and dry.  Neurological:     Mental Status: She is alert and oriented to person, place, and time.      CMP Latest Ref Rng & Units 07/02/2018  Glucose 70 - 99 mg/dL 147(H)  BUN 8 - 23 mg/dL 19  Creatinine 0.44 - 1.00 mg/dL 0.85  Sodium 135 - 145 mmol/L 138  Potassium 3.5 - 5.1 mmol/L 4.5  Chloride 98 - 111 mmol/L 102  CO2 22 - 32 mmol/L 27  Calcium 8.9 - 10.3 mg/dL 9.7  Total Protein 6.0 - 8.5 g/dL -  Total Bilirubin 0.0 - 1.2 mg/dL -  Alkaline Phos 39 - 117 IU/L -  AST 0 - 40 IU/L -  ALT 0 - 32 IU/L -   CBC Latest Ref Rng & Units 07/02/2018  WBC 4.0 - 10.5 K/uL 6.9  Hemoglobin 12.0 - 15.0 g/dL 10.5(L)  Hematocrit 36.0 - 46.0 % 32.9(L)  Platelets 150 - 400 K/uL 294     Assessment and plan- Patient is a 63 y.o. female with anemia of chronic disease  Patient's hemoglobin has stayed stable between 9.5-10.5 over the last 2 years.  She does not have any evidence of chronic kidney disease at this time to attribute her anemia kidney disease.  I suspect her anemia is secondary to underlying comorbidities given that her anemia work-up has been essentially unremarkable.  Patient can continue to follow-up with Dr. Brita Romp at this time and if her anemia were to worsen in the future she can be referred back to [place].  She does have mildly low vitamin B 12 levels of 249 and we have asked her to take oral B12 thousand micrograms p.o. daily.  I would recommend Dr. Brita Romp to follow-up with her B12 levels and CBC twice a year.    Visit Diagnosis 1. Anemia of chronic disease      Dr. Randa Evens, MD, MPH [place] [facility]  Center [phone] 07/04/2018 8:32 AM

## 2018-07-05 ENCOUNTER — Telehealth: Payer: Self-pay | Admitting: *Deleted

## 2018-07-05 NOTE — Telephone Encounter (Signed)
Sent/Faxed Dr. Assunta Gambles last clinic note to pt's PCP with request for PCP to continue monitoring patients B12 levels and cbc's. As patient will no longer needs to be seen in cancer center.

## 2018-07-08 ENCOUNTER — Other Ambulatory Visit: Payer: Self-pay | Admitting: Family Medicine

## 2018-07-08 MED ORDER — BUDESONIDE-FORMOTEROL FUMARATE 160-4.5 MCG/ACT IN AERO: 2.0000 | INHALATION_SPRAY | Freq: Two times a day (BID) | RESPIRATORY_TRACT | 3 refills | Status: DC

## 2018-07-08 MED ORDER — SPIRIVA HANDIHALER 18 MCG IN CAPS: 18.0000 ug | ORAL_CAPSULE | Freq: Every day | RESPIRATORY_TRACT | 3 refills | Status: DC

## 2018-07-08 NOTE — Telephone Encounter (Signed)
[facility] faxed refill request for the following medications:  budesonide-formoterol (SYMBICORT) 160-4.5 MCG/ACT inhaler  SPIRIVA HANDIHALER 18 MCG inhalation capsule   Please advise.

## 2018-07-08 NOTE — Telephone Encounter (Signed)
Please advise 

## 2018-07-09 MED ORDER — BUDESONIDE-FORMOTEROL FUMARATE 160-4.5 MCG/ACT IN AERO: 2.0000 | INHALATION_SPRAY | Freq: Two times a day (BID) | RESPIRATORY_TRACT | 3 refills | Status: DC

## 2018-07-09 MED ORDER — SPIRIVA HANDIHALER 18 MCG IN CAPS: 18.0000 ug | ORAL_CAPSULE | Freq: Every day | RESPIRATORY_TRACT | 3 refills | Status: DC

## 2018-07-09 NOTE — Addendum Note (Signed)
Addended by: Erasmo Downer on: 07/09/2018 10:16 AM   Modules accepted: Orders

## 2018-07-12 ENCOUNTER — Other Ambulatory Visit: Payer: Self-pay | Admitting: Family Medicine

## 2018-07-12 MED ORDER — SPIRIVA HANDIHALER 18 MCG IN CAPS: 18.0000 ug | ORAL_CAPSULE | Freq: Every day | RESPIRATORY_TRACT | 3 refills | Status: DC

## 2018-07-12 MED ORDER — BUDESONIDE-FORMOTEROL FUMARATE 160-4.5 MCG/ACT IN AERO: 2.0000 | INHALATION_SPRAY | Freq: Two times a day (BID) | RESPIRATORY_TRACT | 3 refills | Status: DC

## 2018-07-12 NOTE — Telephone Encounter (Signed)
Emma Fletcher Self [phone]  [facility]  [name] called to say she needs refills on her 2 Inhalers

## 2018-08-01 ENCOUNTER — Other Ambulatory Visit: Payer: Self-pay | Admitting: Family Medicine

## 2018-08-02 ENCOUNTER — Other Ambulatory Visit: Payer: Self-pay | Admitting: Family Medicine

## 2018-08-02 NOTE — Telephone Encounter (Signed)
[facility] faxed refill request for the following medications:  levothyroxine (SYNTHROID, LEVOTHROID) 100 MCG tablet   Please advise. 

## 2018-08-02 NOTE — Telephone Encounter (Signed)
L.O.V. 06/06/2018, please advise.

## 2018-08-05 MED ORDER — LEVOTHYROXINE SODIUM 100 MCG PO TABS: 100.0000 ug | ORAL_TABLET | Freq: Every day | ORAL | 3 refills | Status: DC

## 2018-08-06 ENCOUNTER — Other Ambulatory Visit: Payer: Self-pay | Admitting: Family Medicine

## 2018-08-12 ENCOUNTER — Encounter: Payer: Self-pay | Admitting: Podiatry

## 2018-08-12 ENCOUNTER — Ambulatory Visit (INDEPENDENT_AMBULATORY_CARE_PROVIDER_SITE_OTHER): Payer: Medicare Other | Admitting: Podiatry

## 2018-08-12 DIAGNOSIS — B351 Tinea unguium: Secondary | ICD-10-CM | POA: Diagnosis not present

## 2018-08-12 DIAGNOSIS — M79676 Pain in unspecified toe(s): Secondary | ICD-10-CM | POA: Diagnosis not present

## 2018-08-12 DIAGNOSIS — E119 Type 2 diabetes mellitus without complications: Secondary | ICD-10-CM

## 2018-08-12 NOTE — Progress Notes (Signed)
Complaint:  Visit Type: Patient returns to my office for continued preventative foot care services. Complaint: Patient states" my nails have grown long and thick and become painful to walk and wear shoes" Patient has been diagnosed with DM with no foot complications. The patient presents for preventative foot care services. No changes to ROS.    Podiatric Exam: Vascular: dorsalis pedis and posterior tibial pulses are palpable bilateral. Capillary return is immediate. Temperature gradient is WNL. Skin turgor WNL  Sensorium: Normal Semmes Weinstein monofilament test. Normal tactile sensation bilaterally. Nail Exam: Pt has elongated ingrown toenails  B/L. Ulcer Exam: There is no evidence of ulcer or pre-ulcerative changes or infection. Orthopedic Exam: Muscle tone and strength are WNL. No limitations in general ROM. No crepitus or effusions noted. Foot type and digits show no abnormalities. Bony prominences are unremarkable. Painful bone over 5th met right foot.. Skin: No Porokeratosis. No infection or ulcers.   Diagnosis:  Onychomycosis  Hallux  B/L, Pain in right toe, pain in left toes .  Treatment & Plan Procedures and Treatment: Consent by patient was obtained for treatment procedures.   Debridement of mycotic and hypertrophic toenails, 1 through 5 bilateral and clearing of subungual debris. No ulceration, no infection noted.  Return Visit-Office Procedure: Patient instructed to return to the office for a follow up visit 3 months   for continued evaluation and treatment.    Gardiner Barefoot DPM

## 2018-09-04 ENCOUNTER — Telehealth: Payer: Self-pay

## 2018-09-04 NOTE — Telephone Encounter (Signed)
Patient had called office stating that her insurance company called her to inform her to contact her PCP about diabetic test? It was very hard understanding patient on the phone but when I asked if she need to scheduled a DM follow up she said no she needs diabetic test. Please contact patient back on her home phone to clarify. Thanks [employer]

## 2018-09-05 NOTE — Telephone Encounter (Signed)
Patient stated that her insurance company would like to have her diabete test strip send in as 90 day if possible.

## 2018-09-05 NOTE — Telephone Encounter (Signed)
Looks like it was prescribed for 100 strips at a time last time.  That is a 90 day supply. I'm confused.  Does she just need a refill? We can do that

## 2018-09-05 NOTE — Telephone Encounter (Signed)
I spoke to patient sister (June) and she stated to leave everything the way it is.She stated that Deshayla was a little upset because she did not go pick up her test strips from the pharmacy yet.

## 2018-10-01 ENCOUNTER — Ambulatory Visit: Payer: Medicare Other | Admitting: Family Medicine

## 2018-11-04 ENCOUNTER — Ambulatory Visit: Payer: Medicare Other | Admitting: Podiatry

## 2018-11-13 ENCOUNTER — Ambulatory Visit: Payer: Medicare Other | Admitting: Family Medicine

## 2018-11-18 ENCOUNTER — Other Ambulatory Visit: Payer: Self-pay

## 2018-11-18 ENCOUNTER — Encounter: Payer: Self-pay | Admitting: Podiatry

## 2018-11-18 ENCOUNTER — Ambulatory Visit (INDEPENDENT_AMBULATORY_CARE_PROVIDER_SITE_OTHER): Payer: Medicare Other | Admitting: Podiatry

## 2018-11-18 VITALS — Temp 98.1°F

## 2018-11-18 DIAGNOSIS — M79676 Pain in unspecified toe(s): Secondary | ICD-10-CM

## 2018-11-18 DIAGNOSIS — B351 Tinea unguium: Secondary | ICD-10-CM

## 2018-11-18 NOTE — Progress Notes (Signed)
Complaint:  Visit Type: Patient returns to my office for continued preventative foot care services. Complaint: Patient states" my nails have grown long and thick and become painful to walk and wear shoes" Patient has been diagnosed with DM with no foot complications. The patient presents for preventative foot care services. No changes to ROS.    Podiatric Exam: Vascular: dorsalis pedis and posterior tibial pulses are palpable bilateral. Capillary return is immediate. Temperature gradient is WNL. Skin turgor WNL  Sensorium: Normal Semmes Weinstein monofilament test. Normal tactile sensation bilaterally. Nail Exam: Pt has elongated ingrown toenails  B/L. Ulcer Exam: There is no evidence of ulcer or pre-ulcerative changes or infection. Orthopedic Exam: Muscle tone and strength are WNL. No limitations in general ROM. No crepitus or effusions noted. Foot type and digits show no abnormalities. Bony prominences are unremarkable. Painful bone 5th toe left foot. Skin: No Porokeratosis. No infection or ulcers.   Diagnosis:  Onychomycosis  Hallux  B/L, Pain in right toe, pain in left toes .  Treatment & Plan Procedures and Treatment: Consent by patient was obtained for treatment procedures.   Debridement of mycotic and hypertrophic toenails, 1 through 5 bilateral and clearing of subungual debris. No ulceration, no infection noted.  Return Visit-Office Procedure: Patient instructed to return to the office for a follow up visit 3 months   for continued evaluation and treatment.    Helane Gunther DPM

## 2018-11-27 ENCOUNTER — Ambulatory Visit (INDEPENDENT_AMBULATORY_CARE_PROVIDER_SITE_OTHER): Payer: Medicare Other | Admitting: Family Medicine

## 2018-11-27 ENCOUNTER — Encounter: Payer: Self-pay | Admitting: Family Medicine

## 2018-11-27 DIAGNOSIS — E538 Deficiency of other specified B group vitamins: Secondary | ICD-10-CM

## 2018-11-27 DIAGNOSIS — E871 Hypo-osmolality and hyponatremia: Secondary | ICD-10-CM

## 2018-11-27 DIAGNOSIS — E1169 Type 2 diabetes mellitus with other specified complication: Secondary | ICD-10-CM

## 2018-11-27 DIAGNOSIS — N182 Chronic kidney disease, stage 2 (mild): Secondary | ICD-10-CM

## 2018-11-27 DIAGNOSIS — E039 Hypothyroidism, unspecified: Secondary | ICD-10-CM

## 2018-11-27 DIAGNOSIS — I10 Essential (primary) hypertension: Secondary | ICD-10-CM

## 2018-11-27 DIAGNOSIS — D649 Anemia, unspecified: Secondary | ICD-10-CM

## 2018-11-27 DIAGNOSIS — E785 Hyperlipidemia, unspecified: Secondary | ICD-10-CM

## 2018-11-27 NOTE — Assessment & Plan Note (Signed)
Anemia of chronic disease Status post work-up with hematology Recheck B12 and CBC twice annually

## 2018-11-27 NOTE — Assessment & Plan Note (Signed)
Previously well controlled and currently asymptomatic Continue Synthroid at current dose Recheck TSH and dose adjust as indicated

## 2018-11-27 NOTE — Assessment & Plan Note (Signed)
Previously well controlled No hypoglycemic episodes Discussed diabetic diet and importance of exercises Recheck A1c Continue metformin at current dose Pending A1c, could consider dose titration if needed

## 2018-11-27 NOTE — Assessment & Plan Note (Signed)
Recheck metabolic panel Continue to monitor

## 2018-11-27 NOTE — Assessment & Plan Note (Signed)
Not currently taking a statin Given that she does have diabetes, we should consider addition of 1 Recheck FLP and CMP today

## 2018-11-27 NOTE — Assessment & Plan Note (Signed)
Previously well controlled Plan to recheck when she comes in to get labs Continue lisinopril Recheck metabolic panel Follow-up in 6 months

## 2018-11-27 NOTE — Progress Notes (Signed)
Patient: Emma Fletcher Female    DOB: 08/29/1955   63 y.o.   MRN: [id number] Visit Date: 11/27/2018  Today's Provider: Lavon Paganini, MD   Chief Complaint  Patient presents with  . Diabetes  . Hypertension  . Hyperlipidemia   Subjective:    Virtual Visit via Telephone Note  I connected with Emma Fletcher on 11/27/18 at  8:20 AM EDT by telephone and verified that I am speaking with the correct person using two identifiers.   Patient location: home Provider location: [place] involved in the visit: patient, provider, patient's sister (June)   I discussed the limitations, risks, security and privacy concerns of performing an evaluation and management service by telephone and the availability of in person appointments. I also discussed with the patient that there may be a patient responsible charge related to this service. The patient expressed understanding and agreed to proceed.  HPI  Diabetes Mellitus Type II, Follow-up:   Lab Results  Component Value Date   HGBA1C 6.8 (H) 03/26/2018   HGBA1C 6.5 (H) 09/25/2017   HGBA1C 6.2 (H) 05/15/2016   Last seen for diabetes 6 months ago.  Management since then includes no changes She reports good compliance with treatment. She is not having side effects.  Current symptoms include none Home blood sugar records: fasting range: not checking  Episodes of hypoglycemia? No    Current Insulin Regimen: none Most Recent Eye Exam: 01/23/2018 Weight trend: stable Current diet: well balanced Current exercise: 2 times per week   ------------------------------------------------------------------------   Hypertension, follow-up:  BP Readings from Last 3 Encounters:  07/02/18 (!) 157/88  06/06/18 131/84  04/24/18 135/77    She was last seen for hypertension 6 months ago.  BP at that visit was 138/88. Management since that visit includes no change.She reports good compliance with treatment.  She is not having side effects.  She is exercising. She is adherent to low salt diet.   Outside blood pressures are not being checked. She is experiencing none.  Patient denies chest pain, chest pressure/discomfort, claudication, dyspnea, exertional chest pressure/discomfort, fatigue, irregular heart beat, lower extremity edema, near-syncope, orthopnea, palpitations, paroxysmal nocturnal dyspnea, syncope and tachypnea.   Cardiovascular risk factors include diabetes mellitus, dyslipidemia and hypertension.  Use of agents associated with hypertension: thyroid hormones.   ------------------------------------------------------------------------    Lipid/Cholesterol, Follow-up:   Last seen for this 6 months ago.  Management since that visit includes no change.  Last Lipid Panel:    Component Value Date/Time   CHOL 173 09/25/2017 1007   TRIG 55 09/25/2017 1007   HDL 73 09/25/2017 1007   CHOLHDL 2.4 09/25/2017 1007   LDLCALC 89 09/25/2017 1007    She reports good compliance with treatment. She is not having side effects.   Wt Readings from Last 3 Encounters:  07/02/18 219 lb (99.3 kg)  06/06/18 220 lb (99.8 kg)  04/24/18 220 lb (99.8 kg)    ------------------------------------------------------------------------  No Known Allergies   Current Outpatient Medications:  .  Blood Glucose Monitoring Suppl (ONE TOUCH ULTRA 2) w/Device KIT, Use as directed to check blood sugar once daily, Disp: 1 each, Rfl: 0 .  budesonide-formoterol (SYMBICORT) 160-4.5 MCG/ACT inhaler, Inhale 2 puffs into the lungs 2 (two) times daily., Disp: 3 Inhaler, Rfl: 3 .  Cholecalciferol (VITAMIN D3) 5000 units CAPS, Take 1 capsule (5,000 Units total) by mouth daily., Disp: 90 capsule, Rfl: 3 .  Choline Fenofibrate (FENOFIBRIC ACID) 135 MG CPDR,  Take 135 mg by mouth daily. , Disp: , Rfl:  .  Ferrous Sulfate (IRON) 325 (65 Fe) MG TABS, Take 2 tablets by mouth daily., Disp: , Rfl:  .  fluticasone (FLONASE) 50  MCG/ACT nasal spray, Place 2 sprays into both nostrils daily., Disp: 16 g, Rfl: 6 .  furosemide (LASIX) 20 MG tablet, Take 1 tablet (20 mg total) by mouth daily., Disp: 90 tablet, Rfl: 2 .  glucose blood (ONE TOUCH ULTRA TEST) test strip, Use as instructed to check blood glucose daily, Disp: 100 each, Rfl: 3 .  lansoprazole (PREVACID) 30 MG capsule, TAKE 1 CAPSULE (30 MG TOTAL) BY MOUTH DAILY., Disp: 90 capsule, Rfl: 2 .  levothyroxine (SYNTHROID, LEVOTHROID) 100 MCG tablet, TAKE 1 TABLET (100 MCG TOTAL) BY MOUTH DAILY., Disp: 90 tablet, Rfl: 3 .  lisinopril (PRINIVIL,ZESTRIL) 20 MG tablet, Take 1 tablet (20 mg total) by mouth daily., Disp: 90 tablet, Rfl: 2 .  metFORMIN (GLUCOPHAGE-XR) 500 MG 24 hr tablet, Take 2 tablets (1,000 mg total) by mouth every evening., Disp: 180 tablet, Rfl: 3 .  Multiple Vitamins-Minerals (ONE-A-DAY WOMENS 50 PLUS PO), Take 1 tablet by mouth daily., Disp: , Rfl:  .  ONETOUCH DELICA LANCETS 63Z MISC, Use as directed to check blood glucose daily, Disp: 100 each, Rfl: 5 .  potassium chloride (K-DUR,KLOR-CON) 10 MEQ tablet, TAKE 1 TABLET (10 MEQ TOTAL) BY MOUTH DAILY., Disp: 90 tablet, Rfl: 2 .  SPIRIVA HANDIHALER 18 MCG inhalation capsule, Place 1 capsule (18 mcg total) into inhaler and inhale daily., Disp: 90 capsule, Rfl: 3 .  vitamin B-12 (CYANOCOBALAMIN) 1000 MCG tablet, Take 1,000 mcg by mouth daily., Disp: , Rfl:  .  vitamin C (ASCORBIC ACID) 500 MG tablet, Take 500 mg by mouth daily., Disp: , Rfl:   Review of Systems  Constitutional: Negative.   Respiratory: Negative.   Cardiovascular: Negative.   Musculoskeletal: Negative.     Social History   Tobacco Use  . Smoking status: Never Smoker  . Smokeless tobacco: Never Used  Substance Use Topics  . Alcohol use: No    Alcohol/week: 0.0 standard drinks      Objective:   LMP  (LMP Unknown)  There were no vitals filed for this visit.   Physical Exam      Assessment & Plan    I discussed the  assessment and treatment plan with the patient. The patient was provided an opportunity to ask questions and all were answered. The patient agreed with the plan and demonstrated an understanding of the instructions.   The patient was advised to call back or seek an in-person evaluation if the symptoms worsen or if the condition fails to improve as anticipated.  Problem List Items Addressed This Visit      Cardiovascular and Mediastinum   Essential hypertension - Primary    Previously well controlled Plan to recheck when she comes in to get labs Continue lisinopril Recheck metabolic panel Follow-up in 6 months      Relevant Orders   Comprehensive metabolic panel     Endocrine   T2DM (type 2 diabetes mellitus) ([place])    Previously well controlled No hypoglycemic episodes Discussed diabetic diet and importance of exercises Recheck A1c Continue metformin at current dose Pending A1c, could consider dose titration if needed      Relevant Orders   Hemoglobin A1c   Hypothyroidism    Previously well controlled and currently asymptomatic Continue Synthroid at current dose Recheck TSH and dose adjust as indicated  Relevant Orders   TSH     Genitourinary   CKD (chronic kidney disease), stage II    Recheck serum creatinine today Discussed importance of good control of hypertension and diabetes and avoidance of nephrotoxic medications      Relevant Orders   Comprehensive metabolic panel     Other   Hyponatremia    Recheck metabolic panel Continue to monitor      Relevant Orders   Comprehensive metabolic panel   Anemia    Anemia of chronic disease Status post work-up with hematology Recheck B12 and CBC twice annually      Relevant Orders   CBC w/Diff/Platelet   B12   HLD (hyperlipidemia)    Not currently taking a statin Given that she does have diabetes, we should consider addition of 1 Recheck FLP and CMP today      Relevant Orders   Lipid panel    Comprehensive metabolic panel    Other Visit Diagnoses    B12 deficiency       Relevant Orders   B12       Return in about 6 months (around 05/29/2019) for AWV/chronic disease f/u.   The entirety of the information documented in the History of Present Illness, Review of Systems and Physical Exam were personally obtained by me. Portions of this information were initially documented by Tiburcio Pea, CMA and reviewed by me for thoroughness and accuracy.    Bacigalupo, Dionne Bucy, MD MPH [facility]

## 2018-11-27 NOTE — Assessment & Plan Note (Signed)
Recheck serum creatinine today Discussed importance of good control of hypertension and diabetes and avoidance of nephrotoxic medications

## 2018-11-28 ENCOUNTER — Telehealth: Payer: Self-pay | Admitting: Family Medicine

## 2018-11-28 NOTE — Telephone Encounter (Signed)
Called and spoke with pt's sister, June Watson [facility]).  She states she was unaware of [facility] needing medical records.  June will call [facility] to find out why they need records.  Will call back to let us know if she wants to sign the medical release form to have records sent when pt comes in for her labs next week.  Thanks, [employer]

## 2018-12-02 DIAGNOSIS — E1169 Type 2 diabetes mellitus with other specified complication: Secondary | ICD-10-CM | POA: Diagnosis not present

## 2018-12-02 DIAGNOSIS — E871 Hypo-osmolality and hyponatremia: Secondary | ICD-10-CM | POA: Diagnosis not present

## 2018-12-02 DIAGNOSIS — D649 Anemia, unspecified: Secondary | ICD-10-CM | POA: Diagnosis not present

## 2018-12-02 DIAGNOSIS — E785 Hyperlipidemia, unspecified: Secondary | ICD-10-CM | POA: Diagnosis not present

## 2018-12-02 DIAGNOSIS — I10 Essential (primary) hypertension: Secondary | ICD-10-CM | POA: Diagnosis not present

## 2018-12-02 DIAGNOSIS — N182 Chronic kidney disease, stage 2 (mild): Secondary | ICD-10-CM | POA: Diagnosis not present

## 2018-12-02 DIAGNOSIS — E538 Deficiency of other specified B group vitamins: Secondary | ICD-10-CM | POA: Diagnosis not present

## 2018-12-02 DIAGNOSIS — E039 Hypothyroidism, unspecified: Secondary | ICD-10-CM | POA: Diagnosis not present

## 2018-12-03 LAB — CBC WITH DIFFERENTIAL/PLATELET
Basophils Absolute: 0 10*3/uL (ref 0.0–0.2)
Basos: 0 %
EOS (ABSOLUTE): 0.1 10*3/uL (ref 0.0–0.4)
Eos: 1 %
Hematocrit: 31.1 % — ABNORMAL LOW (ref 34.0–46.6)
Hemoglobin: 10.2 g/dL — ABNORMAL LOW (ref 11.1–15.9)
Immature Grans (Abs): 0 10*3/uL (ref 0.0–0.1)
Immature Granulocytes: 0 %
Lymphocytes Absolute: 1.3 10*3/uL (ref 0.7–3.1)
Lymphs: 18 %
MCH: 29.8 pg (ref 26.6–33.0)
MCHC: 32.8 g/dL (ref 31.5–35.7)
MCV: 91 fL (ref 79–97)
Monocytes Absolute: 0.5 10*3/uL (ref 0.1–0.9)
Monocytes: 6 %
Neutrophils Absolute: 5.3 10*3/uL (ref 1.4–7.0)
Neutrophils: 75 %
Platelets: 306 10*3/uL (ref 150–450)
RBC: 3.42 x10E6/uL — ABNORMAL LOW (ref 3.77–5.28)
RDW: 12.2 % (ref 11.7–15.4)
WBC: 7.2 10*3/uL (ref 3.4–10.8)

## 2018-12-03 LAB — COMPREHENSIVE METABOLIC PANEL
ALT: 11 IU/L (ref 0–32)
AST: 14 IU/L (ref 0–40)
Albumin/Globulin Ratio: 1.6 (ref 1.2–2.2)
Albumin: 4.2 g/dL (ref 3.8–4.8)
Alkaline Phosphatase: 130 IU/L — ABNORMAL HIGH (ref 39–117)
BUN/Creatinine Ratio: 23 (ref 12–28)
BUN: 23 mg/dL (ref 8–27)
Bilirubin Total: <0.2 mg/dL (ref 0.0–1.2)
CO2: 23 mmol/L (ref 20–29)
Calcium: 10 mg/dL (ref 8.7–10.3)
Chloride: 100 mmol/L (ref 96–106)
Creatinine, Ser: 1.01 mg/dL — ABNORMAL HIGH (ref 0.57–1.00)
GFR calc Af Amer: 69 mL/min/{1.73_m2} (ref >59)
GFR calc non Af Amer: 60 mL/min/{1.73_m2} (ref >59)
Globulin, Total: 2.6 g/dL (ref 1.5–4.5)
Glucose: 134 mg/dL — ABNORMAL HIGH (ref 65–99)
Potassium: 4.7 mmol/L (ref 3.5–5.2)
Sodium: 136 mmol/L (ref 134–144)
Total Protein: 6.8 g/dL (ref 6.0–8.5)

## 2018-12-03 LAB — TSH: TSH: 1.85 u[IU]/mL (ref 0.450–4.500)

## 2018-12-03 LAB — LIPID PANEL
Chol/HDL Ratio: 3.3 ratio (ref 0.0–4.4)
Cholesterol, Total: 194 mg/dL (ref 100–199)
HDL: 59 mg/dL (ref >39)
LDL Calculated: 120 mg/dL — ABNORMAL HIGH (ref 0–99)
Triglycerides: 74 mg/dL (ref 0–149)
VLDL Cholesterol Cal: 15 mg/dL (ref 5–40)

## 2018-12-03 LAB — HEMOGLOBIN A1C
Est. average glucose Bld gHb Est-mCnc: 157 mg/dL
Hgb A1c MFr Bld: 7.1 % — ABNORMAL HIGH (ref 4.8–5.6)

## 2018-12-03 LAB — VITAMIN B12: Vitamin B-12: 1121 pg/mL (ref 232–1245)

## 2018-12-04 ENCOUNTER — Telehealth: Payer: Self-pay | Admitting: Family Medicine

## 2018-12-04 NOTE — Telephone Encounter (Signed)
B12 is normal at this time. Cholesterol is up compared to last year. Kidney function is decreased from last check 5 months ago, but not has high as it had been last year. Push fluids. Hemoglobin is stable at 10.2. A1c is up to 7.1 from 6.8.

## 2018-12-04 NOTE — Telephone Encounter (Signed)
Can you see her labs results?

## 2018-12-04 NOTE — Telephone Encounter (Signed)
Pt called wanting to know if her labs are a back yet.  She had them done on Monday  [phone]  Thanks teri

## 2018-12-05 NOTE — Telephone Encounter (Signed)
Patient advised as directed below. 

## 2018-12-12 ENCOUNTER — Telehealth: Payer: Self-pay

## 2018-12-12 NOTE — Telephone Encounter (Signed)
I do not see that we have received a request from [facility]. Thanks TNP

## 2018-12-12 NOTE — Telephone Encounter (Signed)
A representative from [facility] called requesting to speak to someone in medical records. She is calling to check the status of her request for medical records that was sent previously.

## 2018-12-12 NOTE — Telephone Encounter (Signed)
Oh, sorry. Contact number is [phone]  Ext [phone]

## 2018-12-18 ENCOUNTER — Telehealth: Payer: Self-pay

## 2019-01-06 DIAGNOSIS — Q828 Other specified congenital malformations of skin: Secondary | ICD-10-CM | POA: Diagnosis not present

## 2019-01-06 DIAGNOSIS — N183 Chronic kidney disease, stage 3 (moderate): Secondary | ICD-10-CM | POA: Diagnosis not present

## 2019-01-06 DIAGNOSIS — E0822 Diabetes mellitus due to underlying condition with diabetic chronic kidney disease: Secondary | ICD-10-CM | POA: Diagnosis not present

## 2019-01-06 DIAGNOSIS — D689 Coagulation defect, unspecified: Secondary | ICD-10-CM | POA: Diagnosis not present

## 2019-01-13 ENCOUNTER — Other Ambulatory Visit: Payer: Self-pay | Admitting: Family Medicine

## 2019-02-12 ENCOUNTER — Other Ambulatory Visit: Payer: Self-pay | Admitting: Family Medicine

## 2019-02-12 ENCOUNTER — Telehealth: Payer: Self-pay

## 2019-02-12 NOTE — Telephone Encounter (Signed)
Patient's sister June called stating that the patient has had a flare of diverticulitis.  She was going to make a phone visit for tomorrow but you didn't have anything available.  Can you give [place] a time we can do a phone visit or can you call June to discuss the patient. June said that Emma Fletcher is not as bad as she had been but still having some pain.  June CB# [phone]

## 2019-02-13 ENCOUNTER — Encounter: Payer: Self-pay | Admitting: Emergency Medicine

## 2019-02-13 ENCOUNTER — Ambulatory Visit (INDEPENDENT_AMBULATORY_CARE_PROVIDER_SITE_OTHER): Payer: Medicare Other | Admitting: Family Medicine

## 2019-02-13 ENCOUNTER — Emergency Department
Admission: EM | Admit: 2019-02-13 | Discharge: 2019-02-14 | Disposition: A | Discharge status: Home / Self Care | Payer: Medicare Other | Attending: Emergency Medicine | Admitting: Emergency Medicine

## 2019-02-13 ENCOUNTER — Ambulatory Visit: Payer: Medicare Other | Admitting: Physician Assistant

## 2019-02-13 DIAGNOSIS — E039 Hypothyroidism, unspecified: Secondary | ICD-10-CM | POA: Insufficient documentation

## 2019-02-13 DIAGNOSIS — Z79899 Other long term (current) drug therapy: Secondary | ICD-10-CM | POA: Diagnosis not present

## 2019-02-13 DIAGNOSIS — K921 Melena: Secondary | ICD-10-CM | POA: Diagnosis not present

## 2019-02-13 DIAGNOSIS — R1031 Right lower quadrant pain: Secondary | ICD-10-CM | POA: Diagnosis not present

## 2019-02-13 DIAGNOSIS — E1122 Type 2 diabetes mellitus with diabetic chronic kidney disease: Secondary | ICD-10-CM | POA: Insufficient documentation

## 2019-02-13 DIAGNOSIS — I129 Hypertensive chronic kidney disease with stage 1 through stage 4 chronic kidney disease, or unspecified chronic kidney disease: Secondary | ICD-10-CM | POA: Diagnosis not present

## 2019-02-13 DIAGNOSIS — Z7984 Long term (current) use of oral hypoglycemic drugs: Secondary | ICD-10-CM | POA: Insufficient documentation

## 2019-02-13 DIAGNOSIS — R197 Diarrhea, unspecified: Secondary | ICD-10-CM | POA: Diagnosis not present

## 2019-02-13 DIAGNOSIS — R1030 Lower abdominal pain, unspecified: Secondary | ICD-10-CM | POA: Diagnosis not present

## 2019-02-13 DIAGNOSIS — R109 Unspecified abdominal pain: Secondary | ICD-10-CM | POA: Diagnosis present

## 2019-02-13 DIAGNOSIS — N182 Chronic kidney disease, stage 2 (mild): Secondary | ICD-10-CM | POA: Insufficient documentation

## 2019-02-13 LAB — COMPREHENSIVE METABOLIC PANEL
ALT: 16 U/L (ref 0–44)
AST: 17 U/L (ref 15–41)
Albumin: 4.3 g/dL (ref 3.5–5.0)
Alkaline Phosphatase: 137 U/L — ABNORMAL HIGH (ref 38–126)
Anion gap: 12 (ref 5–15)
BUN: 21 mg/dL (ref 8–23)
CO2: 25 mmol/L (ref 22–32)
Calcium: 10 mg/dL (ref 8.9–10.3)
Chloride: 91 mmol/L — ABNORMAL LOW (ref 98–111)
Creatinine, Ser: 0.88 mg/dL (ref 0.44–1.00)
GFR calc Af Amer: >60 mL/min (ref >60)
GFR calc non Af Amer: >60 mL/min (ref >60)
Glucose, Bld: 113 mg/dL — ABNORMAL HIGH (ref 70–99)
Potassium: 4.6 mmol/L (ref 3.5–5.1)
Sodium: 128 mmol/L — ABNORMAL LOW (ref 135–145)
Total Bilirubin: 0.5 mg/dL (ref 0.3–1.2)
Total Protein: 7.8 g/dL (ref 6.5–8.1)

## 2019-02-13 LAB — CBC
HCT: 33.6 % — ABNORMAL LOW (ref 36.0–46.0)
Hemoglobin: 11.2 g/dL — ABNORMAL LOW (ref 12.0–15.0)
MCH: 29.6 pg (ref 26.0–34.0)
MCHC: 33.3 g/dL (ref 30.0–36.0)
MCV: 88.7 fL (ref 80.0–100.0)
Platelets: 335 10*3/uL (ref 150–400)
RBC: 3.79 MIL/uL — ABNORMAL LOW (ref 3.87–5.11)
RDW: 11.9 % (ref 11.5–15.5)
WBC: 10.7 10*3/uL — ABNORMAL HIGH (ref 4.0–10.5)
nRBC: 0 % (ref 0.0–0.2)

## 2019-02-13 LAB — URINALYSIS, COMPLETE (UACMP) WITH MICROSCOPIC
Bilirubin Urine: NEGATIVE
Glucose, UA: NEGATIVE mg/dL
Hgb urine dipstick: NEGATIVE
Ketones, ur: NEGATIVE mg/dL
Nitrite: NEGATIVE
Protein, ur: NEGATIVE mg/dL
Specific Gravity, Urine: 1.004 — ABNORMAL LOW (ref 1.005–1.030)
pH: 6 (ref 5.0–8.0)

## 2019-02-13 LAB — LIPASE, BLOOD: Lipase: 32 U/L (ref 11–51)

## 2019-02-13 MED ORDER — ONDANSETRON HCL 4 MG/2ML IJ SOLN
4.0000 mg | Freq: Once | INTRAMUSCULAR | Status: AC
  Administered 2019-02-13: 4 mg via INTRAVENOUS

## 2019-02-13 MED ORDER — MORPHINE SULFATE (PF) 2 MG/ML IV SOLN
INTRAVENOUS | Status: AC
  Administered 2019-02-13: 2 mg via INTRAVENOUS
  Filled 2019-02-13: qty 1

## 2019-02-13 MED ORDER — ONDANSETRON HCL 4 MG/2ML IJ SOLN
INTRAMUSCULAR | Status: AC
  Administered 2019-02-13: 4 mg via INTRAVENOUS
  Filled 2019-02-13: qty 2

## 2019-02-13 MED ORDER — MORPHINE SULFATE (PF) 2 MG/ML IV SOLN
2.0000 mg | Freq: Once | INTRAVENOUS | Status: AC
  Administered 2019-02-13: 2 mg via INTRAVENOUS

## 2019-02-13 NOTE — Telephone Encounter (Signed)
Patient's sister (Emma Fletcher) was advised about the appointment and states that Emma Fletcher can be reached at [phone]. Emma Fletcher would like for you to call if about Emma Fletcher if possible.FYI

## 2019-02-13 NOTE — Patient Instructions (Signed)
Gastrointestinal Bleeding °Gastrointestinal (GI) bleeding is bleeding somewhere along the digestive tract, between the mouth and the anus. This tract includes the mouth, esophagus, stomach, small intestine, large intestine, and anus. The large intestine is often called the colon. °GI bleeding can be caused by various problems. The severity of these problems can range from mild to serious or even life-threatening. If you have GI bleeding, you may find blood in your stools (feces), you may have black stools, or you may vomit blood. If there is a lot of bleeding, you may need to stay in the hospital. °What are the causes? °This condition may be caused by: °· Inflammation, irritation, or swelling of the esophagus (esophagitis). The esophagus is part of the body that moves food from your mouth to your stomach. °· Swollen veins in the rectum (hemorrhoids). °· Areas of painful tearing in the anus that are often caused by passing hard stool (anal fissures). °· Pouches that form on the colon over time, with age, and may bleed a lot (diverticulosis). °· Inflammation (diverticulitis) in areas with diverticulosis. This can cause pain, fever, and bloody stools, although bleeding may be mild. °· Growths (polyps) or cancer. Colon cancer often starts out as precancerous polyps. °· Gastritis and ulcers. With these, bleeding may come from the upper GI tract, near the stomach. °What increases the risk? °You are more likely to develop this condition if you: °· Have an infection in your stomach from a type of bacteria called Helicobacter pylori. °· Take certain medicines, such as: °? NSAIDs. °? Aspirin. °? Selective serotonin reuptake inhibitors (SSRIs). °? Steroids. °? Antiplatelet or anticoagulant medicines. °· Smoke. °· Drink alcohol. °What are the signs or symptoms? °Common symptoms of this condition include: °· Bright red blood in your vomit, or vomit that looks like coffee grounds. °· Bloody, black, or tarry stools. °? Bleeding  from the lower GI tract will usually cause red or maroon blood in the stools. °? Bleeding from the upper GI tract may cause black, tarry stools that are often stronger smelling than usual. °? In certain cases, if the bleeding is fast enough, the stools may be red. °· Pain or cramping in the abdomen. °How is this diagnosed? °This condition may be diagnosed based on: °· Your medical history and a physical exam. °· Various tests, such as: °? Blood tests. °? Stool tests. °? X-rays and other imaging tests. °? Esophagogastroduodenoscopy (EGD). In this test, a flexible, lighted tube is used to look at your esophagus, stomach, and small intestine. °? Colonoscopy. In this test, a flexible, lighted tube is used to look at your colon. °How is this treated? °Treatment for this condition depends on the cause of the bleeding. For example: °· For bleeding from the esophagus, stomach, small intestine, or colon, the health care provider doing your EGD or colonoscopy may be able to stop the bleeding as part of the procedure. °· Inflammation or infection of the colon can be treated with medicines. °· Certain rectal problems can be treated with creams, suppositories, or warm baths. °· Medicines may be given to reduce acid in your stomach. °· Surgery is sometimes needed. °· Blood transfusions are sometimes needed if a lot of blood has been lost. °If bleeding is mild, you may be allowed to go home. If there is a lot of bleeding, you will need to stay in the hospital for observation. °Follow these instructions at home: ° °· Take over-the-counter and prescription medicines only as told by your health care provider. °·   Eat foods that are high in fiber, such as beans, whole grains, and fresh fruits and vegetables. This will help to keep your stools soft. Eating 1-3 prunes each day works well for many people. °· Drink enough fluid to keep your urine pale yellow. °· Keep all follow-up visits as told by your health care provider. This is  important. °Contact a health care provider if: °· Your symptoms do not improve. °Get help right away if: °· Your bleeding does not stop. °· You feel light-headed or you faint. °· You feel weak. °· You have severe cramps in your back or abdomen. °· You pass large blood clots in your stool. °· Your symptoms are getting worse. °· You have chest pain or fast heartbeats. °Summary °· Gastrointestinal (GI) bleeding is bleeding somewhere along the digestive tract, between the mouth and anus. GI bleeding can be caused by various problems. The severity of these problems can range from mild to serious or even life-threatening. °· Treatment for this condition depends on the cause of the bleeding. °· Take over-the-counter and prescription medicines only as told by your health care provider. °· Keep all follow-up visits as told by your health care provider. This is important. °· Get help right away if your bleeding increases, your symptoms are getting worse, or you have new symptoms. °This information is not intended to replace advice given to you by your health care provider. Make sure you discuss any questions you have with your health care provider. °Document Released: 06/02/2000 Document Revised: 01/16/2018 Document Reviewed: 01/16/2018 °Elsevier Patient Education © 2020 Elsevier Inc. ° °

## 2019-02-13 NOTE — Progress Notes (Signed)
Patient: Emma Fletcher Female    DOB: [birth year]   64 y.o.   MRN: [id number] Visit Date: 02/13/2019  Today's Provider: Lavon Paganini, MD   Chief Complaint  Patient presents with  . Abdominal Pain   Subjective:    I, Porsha McClurkin CMA, am acting as a scribe for Lavon Paganini, MD.  Virtual Visit via Telephone Note  I connected with Emma Fletcher on 02/13/19 at  1:20 PM EDT by telephone and verified that I am speaking with the correct person using two identifiers.  Patient location: home Provider location: [facility] Persons involved in the visit: patient, provider, patient's sister (June) - called after talking to patient    I discussed the limitations, risks, security and privacy concerns of performing an evaluation and management service by telephone and the availability of in person appointments. I also discussed with the patient that there may be a patient responsible charge related to this service. The patient expressed understanding and agreed to proceed.  Abdominal Pain The current episode started in the past 7 days. The pain is located in the generalized abdominal region, RLQ and RUQ. The pain is at a severity of 8/10. The pain is moderate. The quality of the pain is sharp. Associated symptoms include diarrhea (Dark black per pt). Pertinent negatives include no nausea or vomiting. The pain is aggravated by bowel movement. The pain is relieved by nothing. The treatment provided no relief.   Thinks that this feels similar to diverticulitis in the past Stools are dark black for 3-4 days. Abdominal pain is getting worse No fever Decreased appetite   No Known Allergies   Current Outpatient Medications:  .  Blood Glucose Monitoring Suppl (ONE TOUCH ULTRA 2) w/Device KIT, Use as directed to check blood sugar once daily, Disp: 1 each, Rfl: 0 .  budesonide-formoterol (SYMBICORT) 160-4.5 MCG/ACT inhaler, Inhale 2 puffs into the lungs 2 (two)  times daily., Disp: 3 Inhaler, Rfl: 3 .  Cholecalciferol (VITAMIN D3) 5000 units CAPS, Take 1 capsule (5,000 Units total) by mouth daily., Disp: 90 capsule, Rfl: 3 .  Choline Fenofibrate (FENOFIBRIC ACID) 135 MG CPDR, Take 135 mg by mouth daily. , Disp: , Rfl:  .  Ferrous Sulfate (IRON) 325 (65 Fe) MG TABS, Take 2 tablets by mouth daily., Disp: , Rfl:  .  fluticasone (FLONASE) 50 MCG/ACT nasal spray, Place 2 sprays into both nostrils daily., Disp: 16 g, Rfl: 6 .  furosemide (LASIX) 20 MG tablet, TAKE 1 TABLET (20 MG TOTAL) BY MOUTH DAILY., Disp: 90 tablet, Rfl: 2 .  glucose blood (ONE TOUCH ULTRA TEST) test strip, Use as instructed to check blood glucose daily, Disp: 100 each, Rfl: 3 .  lansoprazole (PREVACID) 30 MG capsule, TAKE 1 CAPSULE (30 MG TOTAL) BY MOUTH DAILY., Disp: 90 capsule, Rfl: 2 .  levothyroxine (SYNTHROID, LEVOTHROID) 100 MCG tablet, TAKE 1 TABLET (100 MCG TOTAL) BY MOUTH DAILY., Disp: 90 tablet, Rfl: 3 .  lisinopril (ZESTRIL) 20 MG tablet, TAKE 1 TABLET EVERY DAY, Disp: 90 tablet, Rfl: 2 .  metFORMIN (GLUCOPHAGE-XR) 500 MG 24 hr tablet, Take 2 tablets (1,000 mg total) by mouth every evening., Disp: 180 tablet, Rfl: 3 .  Multiple Vitamins-Minerals (ONE-A-DAY WOMENS 50 PLUS PO), Take 1 tablet by mouth daily., Disp: , Rfl:  .  ONETOUCH DELICA LANCETS 45G MISC, Use as directed to check blood glucose daily, Disp: 100 each, Rfl: 5 .  potassium chloride (K-DUR,KLOR-CON) 10 MEQ tablet, TAKE 1  TABLET (10 MEQ TOTAL) BY MOUTH DAILY., Disp: 90 tablet, Rfl: 2 .  SPIRIVA HANDIHALER 18 MCG inhalation capsule, Place 1 capsule (18 mcg total) into inhaler and inhale daily., Disp: 90 capsule, Rfl: 3 .  vitamin B-12 (CYANOCOBALAMIN) 1000 MCG tablet, Take 1,000 mcg by mouth daily., Disp: , Rfl:  .  vitamin C (ASCORBIC ACID) 500 MG tablet, Take 500 mg by mouth daily., Disp: , Rfl:   Review of Systems  Constitutional: Negative.   Respiratory: Negative.   Gastrointestinal: Positive for abdominal pain  and diarrhea (Dark black per pt). Negative for nausea and vomiting.  Genitourinary: Negative.   Musculoskeletal: Negative.     Social History   Tobacco Use  . Smoking status: Never Smoker  . Smokeless tobacco: Never Used  Substance Use Topics  . Alcohol use: No    Alcohol/week: 0.0 standard drinks      Objective:   LMP  (LMP Unknown)  There were no vitals filed for this visit.   Physical Exam Speaks in full sentences  No results found for any visits on 02/13/19.     Assessment & Plan      I discussed the assessment and treatment plan with the patient. The patient was provided an opportunity to ask questions and all were answered. The patient agreed with the plan and demonstrated an understanding of the instructions.   The patient was advised to call back or seek an in-person evaluation if the symptoms worsen or if the condition fails to improve as anticipated.  I provided 25 minutes of non-face-to-face time during this encounter, coordinating with patient and her sister regarding symptoms and need to go to the ER.   1. Right lower quadrant abdominal pain 2. Diarrhea, unspecified type 3. Melena - Patient with known history of iron deficiency anemia with new onset R sided abd pain, melena - she has reported history of PUD - normal colonoscopy in 2019 - discussed with patient and sister my concern for GI bleed and need for ED evaluation with possible urgent GI referral - discussed that diverticulitis is typically left sided and RLQ pain is concerning for other acute process (patient still has her appendix) - patient's sister agrees to take patient to the ED today for further eval    Return if symptoms worsen or fail to improve.   The entirety of the information documented in the History of Present Illness, Review of Systems and Physical Exam were personally obtained by me. Portions of this information were initially documented by [facility], CMA and reviewed by  me for thoroughness and accuracy.    Kellyann Ordway, Dionne Bucy, MD MPH [facility]

## 2019-02-13 NOTE — ED Triage Notes (Signed)
Pt c/o right lower abdominal pain since Monday. Pt denies N/V but had diarrhea on Monday only. Pt with hx/o ulcer and diverticulitis.

## 2019-02-13 NOTE — ED Provider Notes (Signed)
[facility] Emergency Department Provider Note  ____________________________________________   First MD Initiated Contact with Patient 02/13/19 2344     (approximate)  I have reviewed the triage vital signs and the nursing notes.   HISTORY  Chief Complaint Abdominal Pain    HPI Emma Fletcher is a 63 y.o. female with below list of previous medical conditions including diverticulitis presents to the emergency department secondary to right side abdominal pain which is currently 5 out of 10.  Patient states the pain has been there for the past 3 days.  Patient also admits to multiple episodes of diarrhea 3 days ago with no diarrhea since then..  Patient denies any fever no nausea or vomiting.  Patient denies any urinary symptoms        Past Medical History:  Diagnosis Date  . Anemia   . Bronchitis   . Cardiomyopathy secondary   . Developmental delay   . Diabetes mellitus without complication ([place])   . Edema   . Hypercholesterolemia   . Unspecified essential hypertension     Patient Active Problem List   Diagnosis Date Noted  . HLD (hyperlipidemia) 09/20/2017  . Hypothyroidism 09/20/2017  . Dysfunction of both eustachian tubes 09/20/2017  . CKD (chronic kidney disease), stage II 05/16/2016  . T2DM (type 2 diabetes mellitus) ([place]) 12/18/2015  . Hyponatremia 12/18/2015  . Anemia 12/18/2015  . Essential hypertension 11/10/2008  . Secondary cardiomyopathy ([place]) 11/10/2008  . EDEMA 11/10/2008    Past Surgical History:  Procedure Laterality Date  . COLONOSCOPY    . COLONOSCOPY WITH PROPOFOL N/A 04/24/2018   Procedure: COLONOSCOPY WITH PROPOFOL;  Surgeon: Jonathon Bellows, MD;  Location: [facility] ENDOSCOPY;  Service: Gastroenterology;  Laterality: N/A;  . none      Prior to Admission medications   Medication Sig Start Date End Date Taking? Authorizing Provider  Blood Glucose Monitoring Suppl (ONE TOUCH ULTRA 2) w/Device KIT Use as directed to check  blood sugar once daily 03/26/18   Virginia Crews, MD  budesonide-formoterol [facility]) 160-4.5 MCG/ACT inhaler Inhale 2 puffs into the lungs 2 (two) times daily. 07/12/18   Virginia Crews, MD  Cholecalciferol (VITAMIN D3) 5000 units CAPS Take 1 capsule (5,000 Units total) by mouth daily. 03/26/18   Virginia Crews, MD  Choline Fenofibrate (FENOFIBRIC ACID) 135 MG CPDR Take 135 mg by mouth daily.  07/19/15   [provider]  Ferrous Sulfate (IRON) 325 (65 Fe) MG TABS Take 2 tablets by mouth daily.    [provider]  fluticasone (FLONASE) 50 MCG/ACT nasal spray Place 2 sprays into both nostrils daily. 09/20/17   Bacigalupo, Dionne Bucy, MD  furosemide (LASIX) 20 MG tablet TAKE 1 TABLET (20 MG TOTAL) BY MOUTH DAILY. 01/13/19   Virginia Crews, MD  glucose blood (ONE TOUCH ULTRA TEST) test strip Use as instructed to check blood glucose daily 03/26/18   Virginia Crews, MD  lansoprazole (PREVACID) 30 MG capsule TAKE 1 CAPSULE (30 MG TOTAL) BY MOUTH DAILY. 08/07/18   Bacigalupo, Dionne Bucy, MD  levothyroxine (SYNTHROID, LEVOTHROID) 100 MCG tablet TAKE 1 TABLET (100 MCG TOTAL) BY MOUTH DAILY. 08/07/18   Virginia Crews, MD  lisinopril (ZESTRIL) 20 MG tablet TAKE 1 TABLET EVERY DAY 02/12/19   Virginia Crews, MD  metFORMIN (GLUCOPHAGE-XR) 500 MG 24 hr tablet Take 2 tablets (1,000 mg total) by mouth every evening. 05/22/18   Virginia Crews, MD  Multiple Vitamins-Minerals (ONE-A-DAY WOMENS 50 PLUS PO) Take 1  tablet by mouth daily.    [provider]  Jonetta Speak LANCETS 16X MISC Use as directed to check blood glucose daily 03/26/18   Bacigalupo, Dionne Bucy, MD  potassium chloride (K-DUR,KLOR-CON) 10 MEQ tablet TAKE 1 TABLET (10 MEQ TOTAL) BY MOUTH DAILY. 08/07/18   Bacigalupo, Dionne Bucy, MD  SPIRIVA HANDIHALER 18 MCG inhalation capsule Place 1 capsule (18 mcg total) into inhaler and inhale daily. 07/12/18   Virginia Crews, MD  vitamin B-12  (CYANOCOBALAMIN) 1000 MCG tablet Take 1,000 mcg by mouth daily.    [provider]  vitamin C (ASCORBIC ACID) 500 MG tablet Take 500 mg by mouth daily.    [provider]    Allergies Patient has no known allergies.  Family History  Problem Relation Age of Onset  . Diabetes Mother   . Hypertension Mother   . Stroke Mother   . Diabetes Father   . Coronary artery disease Father   . Breast cancer Maternal Aunt   . Lung cancer Maternal Uncle   . Prostate cancer Paternal Uncle   . Uterine cancer Other     Social History Social History   Tobacco Use  . Smoking status: Never Smoker  . Smokeless tobacco: Never Used  Substance Use Topics  . Alcohol use: No    Alcohol/week: 0.0 standard drinks  . Drug use: No    Review of Systems Constitutional: No fever/chills Eyes: No visual changes. ENT: No sore throat. Cardiovascular: Denies chest pain. Respiratory: Denies shortness of breath. Gastrointestinal: Positive for abdominal pain and diarrhea (4 days ago) Genitourinary: Negative for dysuria. Musculoskeletal: Negative for neck pain.  Negative for back pain. Integumentary: Negative for rash. Neurological: Negative for headaches, focal weakness or numbness.   ____________________________________________   PHYSICAL EXAM:  VITAL SIGNS: ED Triage Vitals [02/13/19 1929]  Enc Vitals Group     BP (!) 169/73     Pulse Rate 77     Resp 17     Temp 98.8 F (37.1 C)     Temp Source Oral     SpO2 100 %     Weight      Height      Head Circumference      Peak Flow      Pain Score      Pain Loc      Pain Edu?      Excl. in [place]?      Constitutional: Alert and oriented.  Eyes: Conjunctivae are normal.  Mouth/Throat: Mucous membranes are moist. Neck: No stridor.  No meningeal signs.   Cardiovascular: Normal rate, regular rhythm. Good peripheral circulation. Grossly normal heart sounds. Respiratory: Normal respiratory effort.  No retractions.  Gastrointestinal: Right upper quadrant/right lower quadrant tenderness to palpation.. No distention.  Musculoskeletal: No lower extremity tenderness nor edema. No gross deformities of extremities. Neurologic:  Normal speech and language. No gross focal neurologic deficits are appreciated.  Skin:  Skin is warm, dry and intact. Psychiatric: Mood and affect are normal. Speech and behavior are normal.  ____________________________________________   LABS (all labs ordered are listed, but only abnormal results are displayed)  Labs Reviewed  COMPREHENSIVE METABOLIC PANEL - Abnormal; Notable for the following components:      Result Value   Sodium 128 (*)    Chloride 91 (*)    Glucose, Bld 113 (*)    Alkaline Phosphatase 137 (*)    All other components within normal limits  CBC - Abnormal; Notable for the following components:  WBC 10.7 (*)    RBC 3.79 (*)    Hemoglobin 11.2 (*)    HCT 33.6 (*)    All other components within normal limits  URINALYSIS, COMPLETE (UACMP) WITH MICROSCOPIC - Abnormal; Notable for the following components:   Color, Urine STRAW (*)    APPearance CLEAR (*)    Specific Gravity, Urine 1.004 (*)    Leukocytes,Ua SMALL (*)    Bacteria, UA RARE (*)    All other components within normal limits  LIPASE, BLOOD   ________________  RADIOLOGY I, [place] Vir[place] Betters, personally viewed and evaluated these images (plain radiographs) as part of my medical decision making, as well as reviewing the written report by the radiologist.  ED MD interpretation: No acute intra-abdominal or pelvic pathology normal appendix sigmoid diverticulosis  Official radiology report(s): Ct Abdomen Pelvis W Contrast  Result Date: 02/14/2019 CLINICAL DATA:  63 year old female with right lower quadrant abdominal pain. EXAM: CT ABDOMEN AND PELVIS WITH CONTRAST TECHNIQUE: Multidetector CT imaging of the abdomen and pelvis was performed using the standard protocol following bolus administration of  intravenous contrast. CONTRAST:  144m OMNIPAQUE IOHEXOL 300 MG/ML  SOLN COMPARISON:  Abdominal ultrasound dated 07/01/2017 FINDINGS: Lower chest: The visualized lung bases are clear. No intra-abdominal free air or free fluid. Hepatobiliary: No focal liver abnormality is seen. No gallstones, gallbladder wall thickening, or biliary dilatation. Pancreas: Unremarkable. No pancreatic ductal dilatation or surrounding inflammatory changes. Spleen: Faint 1 cm hypodense lesion in the upper pole of the spleen is not characterized but may represent a cyst or hemangioma. Adrenals/Urinary Tract: Adrenal glands are unremarkable. Kidneys are normal, without renal calculi, focal lesion, or hydronephrosis. Bladder is unremarkable. Stomach/Bowel: There is sigmoid diverticulosis without active inflammatory changes. There is no bowel obstruction or active inflammation. The appendix is normal. Vascular/Lymphatic: The abdominal aorta and IVC are unremarkable. No portal venous gas. There is no adenopathy. Reproductive: The uterus and ovaries are grossly unremarkable. No pelvic mass. Other: None Musculoskeletal: Degenerative changes of the spine. No acute osseous pathology. IMPRESSION: 1. No acute intra-abdominal or pelvic pathology. Normal appendix. 2. Sigmoid diverticulosis. Electronically Signed   By: AAnner CreteM.D.   On: 02/14/2019 00:39      Procedures   ____________________________________________   INITIAL IMPRESSION / MDM / ASSESSMENT AND PLAN / ED COURSE  As part of my medical decision making, I reviewed the following data within the [employer]  63year old female presented with above-stated history and physical exam secondary to abdominal pain with concern for possible diverticulitis given known history of diverticulosis less likely appendicitis.  CT scan revealed no acute intra-abdominal pathology.  Diverticulosis noted however no evidence of diverticulitis.  Patient be referred to  gastroenterology Dr. TBonna Gainsfor further outpatient evaluation. ____________________________________________  FINAL CLINICAL IMPRESSION(S) / ED DIAGNOSES  Final diagnoses:  Lower abdominal pain     MEDICATIONS GIVEN DURING THIS VISIT:  Medications  ondansetron (ZOFRAN) injection 4 mg (4 mg Intravenous Given 02/13/19 2359)  morphine 2 MG/ML injection 2 mg (2 mg Intravenous Given 02/13/19 2359)  iohexol (OMNIPAQUE) 300 MG/ML solution 100 mL (100 mLs Intravenous Contrast Given 02/14/19 0012)     ED Discharge Orders    None      *Please note:  Emma ILLINGWORTHwas evaluated in Emergency Department on 02/14/2019 for the symptoms described in the history of present illness. She was evaluated in the context of the global COVID-19 pandemic, which necessitated consideration that the patient might be at risk for infection with  the SARS-CoV-2 virus that causes COVID-19. Institutional protocols and algorithms that pertain to the evaluation of patients at risk for COVID-19 are in a state of rapid change based on information released by regulatory bodies including the CDC and federal and state organizations. These policies and algorithms were followed during the patient's care in the ED.  Some ED evaluations and interventions may be delayed as a result of limited staffing during the pandemic.*  Note:  This document was prepared using Dragon voice recognition software and may include unintentional dictation errors.   Gregor Hams, MD 02/14/19 [phone]

## 2019-02-13 NOTE — Telephone Encounter (Signed)
She can be double booked at 120 today. I'll be able to call her to discuss before going to the procedure that is also scheduled at 120.

## 2019-02-14 ENCOUNTER — Emergency Department: Payer: Medicare Other

## 2019-02-14 ENCOUNTER — Ambulatory Visit: Payer: Medicare Other | Admitting: Family Medicine

## 2019-02-14 DIAGNOSIS — R1030 Lower abdominal pain, unspecified: Secondary | ICD-10-CM | POA: Diagnosis not present

## 2019-02-14 MED ORDER — IOHEXOL 300 MG/ML  SOLN
100.0000 mL | Freq: Once | INTRAMUSCULAR | Status: AC | PRN
  Administered 2019-02-14: 100 mL via INTRAVENOUS

## 2019-02-14 NOTE — ED Notes (Signed)
Legal guardian in route for discharge.

## 2019-02-17 ENCOUNTER — Encounter: Payer: Self-pay | Admitting: Podiatry

## 2019-02-17 ENCOUNTER — Other Ambulatory Visit: Payer: Self-pay

## 2019-02-17 ENCOUNTER — Telehealth: Payer: Self-pay

## 2019-02-17 ENCOUNTER — Ambulatory Visit (INDEPENDENT_AMBULATORY_CARE_PROVIDER_SITE_OTHER): Payer: Medicare Other | Admitting: Podiatry

## 2019-02-17 DIAGNOSIS — L600 Ingrowing nail: Secondary | ICD-10-CM

## 2019-02-17 DIAGNOSIS — E119 Type 2 diabetes mellitus without complications: Secondary | ICD-10-CM | POA: Diagnosis not present

## 2019-02-17 DIAGNOSIS — M79676 Pain in unspecified toe(s): Secondary | ICD-10-CM

## 2019-02-17 DIAGNOSIS — B351 Tinea unguium: Secondary | ICD-10-CM | POA: Diagnosis not present

## 2019-02-17 NOTE — Telephone Encounter (Signed)
Patient's sister June Shon Baton would like to know if you believe that the GI doctor listed on the ER notes is okay for her sister to go for a follow up.

## 2019-02-17 NOTE — Telephone Encounter (Signed)
Yes. Dr Bonna Gains will take great care of her. That is who I would have recommended for her also.

## 2019-02-17 NOTE — Telephone Encounter (Signed)
Patient's sister June advised.

## 2019-02-17 NOTE — Progress Notes (Signed)
Complaint:  Visit Type: Patient returns to my office for continued preventative foot care services. Complaint: Patient states" my nails have grown long and thick and become painful to walk and wear shoes" Patient has been diagnosed with DM with no foot complications. The patient presents for preventative foot care services. No changes to ROS.    Podiatric Exam: Vascular: dorsalis pedis and posterior tibial pulses are palpable bilateral. Capillary return is immediate. Temperature gradient is WNL. Skin turgor WNL  Sensorium: Normal Semmes Weinstein monofilament test. Normal tactile sensation bilaterally. Nail Exam: Pt has elongated ingrown toenails  B/L.Marland Kitchen  Thick disfigured hallux nails  B/l. Ulcer Exam: There is no evidence of ulcer or pre-ulcerative changes or infection. Orthopedic Exam: Muscle tone and strength are WNL. No limitations in general ROM. No crepitus or effusions noted. Foot type and digits show no abnormalities. Bony prominences are unremarkable. Painful bone 5th toe left foot. Skin: No Porokeratosis. No infection or ulcers.   Diagnosis:  Onychomycosis  Hallux  B/L, Pain in right toe, pain in left toes .  Treatment & Plan Procedures and Treatment: Consent by patient was obtained for treatment procedures.   Debridement of mycotic and hypertrophic toenails, 1 through 5 bilateral and clearing of subungual debris. No ulceration, no infection noted.  Return Visit-Office Procedure: Patient instructed to return to the office for a follow up visit 3 months   for continued evaluation and treatment.    Gardiner Barefoot DPM

## 2019-02-26 ENCOUNTER — Ambulatory Visit (INDEPENDENT_AMBULATORY_CARE_PROVIDER_SITE_OTHER): Payer: Medicare Other | Admitting: Gastroenterology

## 2019-02-26 ENCOUNTER — Other Ambulatory Visit: Payer: Self-pay

## 2019-02-26 ENCOUNTER — Encounter: Payer: Self-pay | Admitting: Gastroenterology

## 2019-02-26 VITALS — BP 123/72 | HR 74 | Temp 97.2°F | Wt 216.2 lb

## 2019-02-26 DIAGNOSIS — K5900 Constipation, unspecified: Secondary | ICD-10-CM | POA: Diagnosis not present

## 2019-02-26 DIAGNOSIS — R748 Abnormal levels of other serum enzymes: Secondary | ICD-10-CM

## 2019-02-26 DIAGNOSIS — R1031 Right lower quadrant pain: Secondary | ICD-10-CM

## 2019-02-26 MED ORDER — POLYETHYLENE GLYCOL 3350 17 GM/SCOOP PO POWD: ORAL | 3 refills | Status: AC

## 2019-02-26 NOTE — Patient Instructions (Addendum)
Please go for labs on October 1st or after at our office or any [facility] location.  High-Fiber Diet Fiber, also called dietary fiber, is a type of carbohydrate that is found in fruits, vegetables, whole grains, and beans. A high-fiber diet can have many health benefits. Your health care provider may recommend a high-fiber diet to help:  Prevent constipation. Fiber can make your bowel movements more regular.  Lower your cholesterol.  Relieve the following conditions: ? Swelling of veins in the anus (hemorrhoids). ? Swelling and irritation (inflammation) of specific areas of the digestive tract (uncomplicated diverticulosis). ? A problem of the large intestine (colon) that sometimes causes pain and diarrhea (irritable bowel syndrome, IBS).  Prevent overeating as part of a weight-loss plan.  Prevent heart disease, type 2 diabetes, and certain cancers. What is my plan? The recommended daily fiber intake in grams (g) includes:  38 g for men age 47 or younger.  30 g for men over age 52.  25 g for women age 72 or younger.  21 g for women over age 42. You can get the recommended daily intake of dietary fiber by:  Eating a variety of fruits, vegetables, grains, and beans.  Taking a fiber supplement, if it is not possible to get enough fiber through your diet. What do I need to know about a high-fiber diet?  It is better to get fiber through food sources rather than from fiber supplements. There is not a lot of research about how effective supplements are.  Always check the fiber content on the nutrition facts label of any prepackaged food. Look for foods that contain 5 g of fiber or more per serving.  Talk with a diet and nutrition specialist (dietitian) if you have questions about specific foods that are recommended or not recommended for your medical condition, especially if those foods are not listed below.  Gradually increase how much fiber you consume. If you increase your intake  of dietary fiber too quickly, you may have bloating, cramping, or gas.  Drink plenty of water. Water helps you to digest fiber. What are tips for following this plan?  Eat a wide variety of high-fiber foods.  Make sure that half of the grains that you eat each day are whole grains.  Eat breads and cereals that are made with whole-grain flour instead of refined flour or white flour.  Eat brown rice, bulgur wheat, or millet instead of white rice.  Start the day with a breakfast that is high in fiber, such as a cereal that contains 5 g of fiber or more per serving.  Use beans in place of meat in soups, salads, and pasta dishes.  Eat high-fiber snacks, such as berries, raw vegetables, nuts, and popcorn.  Choose whole fruits and vegetables instead of processed forms like juice or sauce. What foods can I eat?  Fruits Berries. Pears. Apples. Oranges. Avocado. Prunes and raisins. Dried figs. Vegetables Sweet potatoes. Spinach. Kale. Artichokes. Cabbage. Broccoli. Cauliflower. Green peas. Carrots. Squash. Grains Whole-grain breads. Multigrain cereal. Oats and oatmeal. Brown rice. Barley. Bulgur wheat. Millet. Quinoa. Bran muffins. Popcorn. Rye wafer crackers. Meats and other proteins Navy, kidney, and pinto beans. Soybeans. Split peas. Lentils. Nuts and seeds. Dairy Fiber-fortified yogurt. Beverages Fiber-fortified soy milk. Fiber-fortified orange juice. Other foods Fiber bars. The items listed above may not be a complete list of recommended foods and beverages. Contact a dietitian for more options. What foods are not recommended? Fruits Fruit juice. Cooked, strained fruit. Vegetables [place]  potatoes. Canned vegetables. Well-cooked vegetables. Grains White bread. Pasta made with refined flour. White rice. Meats and other proteins Fatty cuts of meat. Fried chicken or fried fish. Dairy Milk. Yogurt. Cream cheese. Sour cream. Fats and oils Butters. Beverages Soft drinks. Other  foods Cakes and pastries. The items listed above may not be a complete list of foods and beverages to avoid. Contact a dietitian for more information. Summary  Fiber is a type of carbohydrate. It is found in fruits, vegetables, whole grains, and beans.  There are many health benefits of eating a high-fiber diet, such as preventing constipation, lowering blood cholesterol, helping with weight loss, and reducing your risk of heart disease, diabetes, and certain cancers.  Gradually increase your intake of fiber. Increasing too fast can result in cramping, bloating, and gas. Drink plenty of water while you increase your fiber.  The best sources of fiber include whole fruits and vegetables, whole grains, nuts, seeds, and beans. This information is not intended to replace advice given to you by your health care provider. Make sure you discuss any questions you have with your health care provider. Document Released: 06/05/2005 Document Revised: 04/09/2017 Document Reviewed: 04/09/2017 Elsevier Patient Education  2020 [employer].

## 2019-02-26 NOTE — Progress Notes (Signed)
Emma Fletcher [address]  [place]  [place], [place] [address]  Main: [phone]  Fax: [phone]   Gastroenterology Consultation  Referring Provider:     Virginia Crews, MD Primary Care Physician:  Virginia Crews, MD Reason for Consultation:     Abdominal pain        HPI:    Chief Complaint  Patient presents with   Hospitalization Follow-up    Patient will have a occ abdominal pain. she also has diarrhea when she eats out at a restraunt and some in the morning     Emma Fletcher is a 63 y.o. y/o female referred for consultation & management  by Dr. Brita Romp, Dionne Bucy, MD.  Martin Majestic to the ER about 2 weeks ago for right lower quadrant abdominal pain which she states resolved after 4 days.  Described it as sharp, nonradiating, 7/10.  Was associated with diarrhea with multiple loose bowel movements a day during the episode with no blood in stool.  Also had one episode of emesis when the pain started.  Has been asymptomatic for the last week.  Does report that she has mild pain, about 2/10 in the area of the knee that self resolves.  Had a CT scan during the ER visit that was nonrevealing.  Patient reports going multiple days without a bowel movement.  Has not had a bowel movement in the last 3 to 4 days.  Does not use anything to help her go.  Has had a previous screening colonoscopy in 2019 that showed diverticulosis and was otherwise normal and repeat was recommended in 10 years.  Has history of anemia, evaluated by hematology, Dr. Janese Banks and determined to be anemia of chronic disease.  Past Medical History:  Diagnosis Date   Anemia    Bronchitis    Cardiomyopathy secondary    Developmental delay    Diabetes mellitus without complication ([place])    Edema    Hypercholesterolemia    Unspecified essential hypertension     Past Surgical History:  Procedure Laterality Date   COLONOSCOPY     COLONOSCOPY WITH PROPOFOL N/A 04/24/2018   Procedure: COLONOSCOPY WITH PROPOFOL;  Surgeon: Jonathon Bellows, MD;  Location: [facility] ENDOSCOPY;  Service: Gastroenterology;  Laterality: N/A;   none      Prior to Admission medications   Medication Sig Start Date End Date Taking? Authorizing Provider  atorvastatin (LIPITOR) 10 MG tablet  02/12/19  Yes [provider]  Blood Glucose Monitoring Suppl (ONE TOUCH ULTRA 2) w/Device KIT Use as directed to check blood sugar once daily 03/26/18  Yes Bacigalupo, Dionne Bucy, MD  budesonide-formoterol [facility]) 160-4.5 MCG/ACT inhaler Inhale 2 puffs into the lungs 2 (two) times daily. 07/12/18  Yes Bacigalupo, Dionne Bucy, MD  Cholecalciferol (VITAMIN D3) 5000 units CAPS Take 1 capsule (5,000 Units total) by mouth daily. 03/26/18  Yes Bacigalupo, Dionne Bucy, MD  Choline Fenofibrate (FENOFIBRIC ACID) 135 MG CPDR Take 135 mg by mouth daily.  07/19/15  Yes [provider]  Ferrous Sulfate (IRON) 325 (65 Fe) MG TABS Take 2 tablets by mouth daily.   Yes [provider]  fluticasone (FLONASE) 50 MCG/ACT nasal spray Place 2 sprays into both nostrils daily. 09/20/17  Yes Bacigalupo, Dionne Bucy, MD  furosemide (LASIX) 20 MG tablet TAKE 1 TABLET (20 MG TOTAL) BY MOUTH DAILY. 01/13/19  Yes Bacigalupo, Dionne Bucy, MD  glucose blood (ONE TOUCH ULTRA TEST) test strip Use as instructed to check blood glucose daily 03/26/18  Yes Bacigalupo, Dionne Bucy, MD  levothyroxine (SYNTHROID, LEVOTHROID) 100 MCG tablet TAKE 1 TABLET (100 MCG TOTAL) BY MOUTH DAILY. 08/07/18  Yes Bacigalupo, Dionne Bucy, MD  lisinopril (ZESTRIL) 20 MG tablet TAKE 1 TABLET EVERY DAY 02/12/19  Yes Bacigalupo, Dionne Bucy, MD  metFORMIN (GLUCOPHAGE-XR) 500 MG 24 hr tablet Take 2 tablets (1,000 mg total) by mouth every evening. 05/22/18  Yes Bacigalupo, Dionne Bucy, MD  Multiple Vitamins-Minerals (ONE-A-DAY WOMENS 50 PLUS PO) Take 1 tablet by mouth daily.   Yes [provider]  omeprazole (PRILOSEC) 20 MG capsule Take 20 mg by mouth daily.   Yes [provider]  Jonetta Speak LANCETS 18H MISC Use as directed to check blood glucose daily 03/26/18  Yes Bacigalupo, Dionne Bucy, MD  potassium chloride (K-DUR,KLOR-CON) 10 MEQ tablet TAKE 1 TABLET (10 MEQ TOTAL) BY MOUTH DAILY. 08/07/18  Yes Bacigalupo, Dionne Bucy, MD  SPIRIVA HANDIHALER 18 MCG inhalation capsule Place 1 capsule (18 mcg total) into inhaler and inhale daily. 07/12/18  Yes Bacigalupo, Dionne Bucy, MD  vitamin B-12 (CYANOCOBALAMIN) 1000 MCG tablet Take 1,000 mcg by mouth daily.   Yes [provider]  vitamin C (ASCORBIC ACID) 500 MG tablet Take 500 mg by mouth daily.   Yes [provider]  lansoprazole (PREVACID) 30 MG capsule TAKE 1 CAPSULE (30 MG TOTAL) BY MOUTH DAILY. Patient not taking: Reported on 02/26/2019 08/07/18   Virginia Crews, MD  polyethylene glycol powder [facility]) 17 GM/SCOOP powder Please use one cup of miralax daily. 02/26/19   Virgel Manifold, MD    Family History  Problem Relation Age of Onset   Diabetes Mother    Hypertension Mother    Stroke Mother    Diabetes Father    Coronary artery disease Father    Breast cancer Maternal Aunt    Lung cancer Maternal Uncle    Prostate cancer Paternal Uncle    Uterine cancer Other      Social History   Tobacco Use   Smoking status: Never Smoker   Smokeless tobacco: Never Used  Substance Use Topics   Alcohol use: No    Alcohol/week: 0.0 standard drinks   Drug use: No    Allergies as of 02/26/2019   (No Known Allergies)    Review of Systems:    All systems reviewed and negative except where noted in HPI.   Physical Exam:  BP 123/72 (BP Location: Left Arm, Patient Position: Sitting, Cuff Size: Normal)    Pulse 74    Temp (!) 97.2 F (36.2 C) (Oral)    Wt 216 lb 4 oz (98.1 kg)    LMP  (LMP Unknown)    BMI 34.90 kg/m  No LMP recorded (lmp unknown). Patient is postmenopausal. Psych:  Alert and cooperative. Normal mood and affect. General:   Alert,  Well-developed,  well-nourished, pleasant and cooperative in NAD Head:  Normocephalic and atraumatic. Eyes:  Sclera clear, no icterus.   Conjunctiva pink. Ears:  Normal auditory acuity. Nose:  No deformity, discharge, or lesions. Mouth:  No deformity or lesions,oropharynx pink & moist. Neck:  Supple; no masses or thyromegaly. Abdomen:  Normal bowel sounds.  No bruits.  Soft, non-tender and non-distended without masses, hepatosplenomegaly or hernias noted.  No guarding or rebound tenderness.    Msk:  Symmetrical without gross deformities. Good, equal movement & strength bilaterally. Pulses:  Normal pulses noted. Extremities:  No clubbing or edema.  No cyanosis. Neurologic:  Alert and oriented x3;  grossly normal neurologically.  Skin:  Intact without significant lesions or rashes. No jaundice. Lymph Nodes:  No significant cervical adenopathy. Psych:  Alert and cooperative. Normal mood and affect.   Labs: CBC    Component Value Date/Time   WBC 10.7 (H) 02/13/2019 1931   RBC 3.79 (L) 02/13/2019 1931   HGB 11.2 (L) 02/13/2019 1931   HGB 10.2 (L) 12/02/2018 1017   HCT 33.6 (L) 02/13/2019 1931   HCT 31.1 (L) 12/02/2018 1017   PLT 335 02/13/2019 1931   PLT 306 12/02/2018 1017   MCV 88.7 02/13/2019 1931   MCV 91 12/02/2018 1017   MCH 29.6 02/13/2019 1931   MCHC 33.3 02/13/2019 1931   RDW 11.9 02/13/2019 1931   RDW 12.2 12/02/2018 1017   LYMPHSABS 1.3 12/02/2018 1017   MONOABS 0.4 07/02/2018 1039   EOSABS 0.1 12/02/2018 1017   BASOSABS 0.0 12/02/2018 1017   CMP     Component Value Date/Time   NA 128 (L) 02/13/2019 1931   NA 136 12/02/2018 1017   K 4.6 02/13/2019 1931   CL 91 (L) 02/13/2019 1931   CO2 25 02/13/2019 1931   GLUCOSE 113 (H) 02/13/2019 1931   BUN 21 02/13/2019 1931   BUN 23 12/02/2018 1017   CREATININE 0.88 02/13/2019 1931   CALCIUM 10.0 02/13/2019 1931   PROT 7.8 02/13/2019 1931   PROT 6.8 12/02/2018 1017   ALBUMIN 4.3 02/13/2019 1931   ALBUMIN 4.2 12/02/2018 1017   AST 17  02/13/2019 1931   ALT 16 02/13/2019 1931   ALKPHOS 137 (H) 02/13/2019 1931   BILITOT 0.5 02/13/2019 1931   BILITOT <0.2 12/02/2018 1017   GFRNONAA >60 02/13/2019 1931   GFRAA >60 02/13/2019 1931    Imaging Studies: Ct Abdomen Pelvis W Contrast  Result Date: 02/14/2019 CLINICAL DATA:  63 year old female with right lower quadrant abdominal pain. EXAM: CT ABDOMEN AND PELVIS WITH CONTRAST TECHNIQUE: Multidetector CT imaging of the abdomen and pelvis was performed using the standard protocol following bolus administration of intravenous contrast. CONTRAST:  19m OMNIPAQUE IOHEXOL 300 MG/ML  SOLN COMPARISON:  Abdominal ultrasound dated 07/01/2017 FINDINGS: Lower chest: The visualized lung bases are clear. No intra-abdominal free air or free fluid. Hepatobiliary: No focal liver abnormality is seen. No gallstones, gallbladder wall thickening, or biliary dilatation. Pancreas: Unremarkable. No pancreatic ductal dilatation or surrounding inflammatory changes. Spleen: Faint 1 cm hypodense lesion in the upper pole of the spleen is not characterized but may represent a cyst or hemangioma. Adrenals/Urinary Tract: Adrenal glands are unremarkable. Kidneys are normal, without renal calculi, focal lesion, or hydronephrosis. Bladder is unremarkable. Stomach/Bowel: There is sigmoid diverticulosis without active inflammatory changes. There is no bowel obstruction or active inflammation. The appendix is normal. Vascular/Lymphatic: The abdominal aorta and IVC are unremarkable. No portal venous gas. There is no adenopathy. Reproductive: The uterus and ovaries are grossly unremarkable. No pelvic mass. Other: None Musculoskeletal: Degenerative changes of the spine. No acute osseous pathology. IMPRESSION: 1. No acute intra-abdominal or pelvic pathology. Normal appendix. 2. Sigmoid diverticulosis. Electronically Signed   By: AAnner CreteM.D.   On: 02/14/2019 00:39    Assessment and Plan:   Emma MCPHEEis a 63y.o.  y/o female has been referred for right lower quadrant abdominal pain with CT scan unrevealing of any acute pathology  Given the finding of diverticulosis on her colonoscopy, CT scan and patient reporting multiple days without a bowel movement, and her right lower quadrant abdominal pain intermittently is possibly due to constipation  There are  no alarm symptoms present  We will start MiraLAX once daily with goal of 1-2 soft bowel movements every day or every other needed.  If not at goal, increase to twice daily.  Hold for 1 to 2 days following stools  If abdominal pain is not better or gets worse in the next few weeks I have asked her to contact us  I noted her alk phos to be mildly elevated on last check and was previously normal.  We will repeat in the next 3 to 4 weeks  Patient also has chronic hyponatremia and I have asked her to follow-up with her primary care doctor to see if she needs a referral for this for further work-up as necessary   Dr Emma Fletcher  Speech recognition software was used to dictate the above note.

## 2019-03-17 ENCOUNTER — Other Ambulatory Visit: Payer: Self-pay | Admitting: Family Medicine

## 2019-03-21 ENCOUNTER — Other Ambulatory Visit: Payer: Self-pay | Admitting: Family Medicine

## 2019-03-24 ENCOUNTER — Telehealth: Payer: Self-pay | Admitting: Gastroenterology

## 2019-03-24 NOTE — Telephone Encounter (Signed)
Emma Fletcher called for patient wanting to schedule a time for labs. Please call patient to let her know if Dr Bonna Gains  Has requested labs.

## 2019-03-24 NOTE — Telephone Encounter (Signed)
Sent mychart message informing patient  

## 2019-03-26 ENCOUNTER — Other Ambulatory Visit: Payer: Self-pay

## 2019-03-26 DIAGNOSIS — R748 Abnormal levels of other serum enzymes: Secondary | ICD-10-CM | POA: Diagnosis not present

## 2019-03-27 LAB — HEPATIC FUNCTION PANEL
ALT: 13 IU/L (ref 0–32)
AST: 15 IU/L (ref 0–40)
Albumin: 4.1 g/dL (ref 3.8–4.8)
Alkaline Phosphatase: 149 IU/L — ABNORMAL HIGH (ref 39–117)
Bilirubin Total: 0.3 mg/dL (ref 0.0–1.2)
Bilirubin, Direct: 0.09 mg/dL (ref 0.00–0.40)
Total Protein: 6.4 g/dL (ref 6.0–8.5)

## 2019-03-27 NOTE — Progress Notes (Signed)
Subjective:   Emma Fletcher is a 63 y.o. female who presents for Medicare Annual (Subsequent) preventive examination.    This visit is being conducted through telemedicine due to the COVID-19 pandemic. This patient has given me verbal consent via doximity to conduct this visit, patient states they are participating from their home address. Some vital signs may be absent or patient reported.    Patient identification: identified by name, DOB, and current address  Review of Systems:  N/A  Cardiac Risk Factors include: advanced age (>37mn, >>41women);dyslipidemia;diabetes mellitus;hypertension;obesity (BMI >30kg/m2)     Objective:     Vitals: LMP  (LMP Unknown)   There is no height or weight on file to calculate BMI. Unable to obtain vitals due to visit being conducted via telephonically.   Advanced Directives 03/31/2019 07/02/2018 04/24/2018 10/19/2017 06/30/2017 02/23/2017 12/12/2016  Does Patient Have a Medical Advance Directive? Yes Yes Yes No Yes Yes No  Type of AParamedicof AAnnadaLiving will - HArchieLiving will - Healthcare Power of ABurtLiving will -  Does patient want to make changes to medical advance directive? - No - Patient declined - - No - Patient declined - -  Copy of HMaybeuryin Chart? Yes - validated most recent copy scanned in chart (See row information) - - - No - copy requested No - copy requested -  Would patient like information on creating a medical advance directive? - - - No - Patient declined - - No - Patient declined    Tobacco Social History   Tobacco Use  Smoking Status Never Smoker  Smokeless Tobacco Never Used     Counseling given: Not Answered   Clinical Intake:  Pre-visit preparation completed: Yes  Pain : No/denies pain Pain Score: 0-No pain     Nutritional Risks: None Diabetes: Yes  How often do you need to have someone help you when  you read instructions, pamphlets, or other written materials from your doctor or pharmacy?: 1 - Never   Diabetes:  Is the patient diabetic?  Yes type 2 If diabetic, was a CBG obtained today?  No  Did the patient bring in their glucometer from home?  No  How often do you monitor your CBG's? Once a day.   Financial Strains and Diabetes Management:  Are you having any financial strains with the device, your supplies or your medication? No .  Does the patient want to be seen by Chronic Care Management for management of their diabetes?  No  Would the patient like to be referred to a Nutritionist or for Diabetic Management?  No   Diabetic Exams:  Diabetic Eye Exam: Completed 01/23/18. Overdue for diabetic eye exam. Pt has been advised about the importance in completing this exam.   Diabetic Foot Exam: Completed 03/26/18. Pt has been advised about the importance in completing this exam. Note made to follow up on this at next in office apt.     Interpreter Needed?: No  Information [facility] :: [facility]  Past Medical History:  Diagnosis Date  . Anemia   . Bronchitis   . Cardiomyopathy secondary   . Developmental delay   . Diabetes mellitus without complication ([place]   . Edema   . Hypercholesterolemia   . Unspecified essential hypertension    Past Surgical History:  Procedure Laterality Date  . COLONOSCOPY    . COLONOSCOPY WITH PROPOFOL N/A 04/24/2018   Procedure: COLONOSCOPY WITH  PROPOFOL;  Surgeon: Jonathon Bellows, MD;  Location: [facility] ENDOSCOPY;  Service: Gastroenterology;  Laterality: N/A;  . none     Family History  Problem Relation Age of Onset  . Diabetes Mother   . Hypertension Mother   . Stroke Mother   . Diabetes Father   . Coronary artery disease Father   . Breast cancer Maternal Aunt   . Lung cancer Maternal Uncle   . Prostate cancer Paternal Uncle   . Uterine cancer Other    Social History   Socioeconomic History  . Marital status: Single    Spouse name:  Not on file  . Number of children: 0  . Years of education: Not on file  . Highest education level: High school graduate  Occupational History  . Occupation: Part time works in [employer]  . Financial resource strain: Not hard at all  . Food insecurity    Worry: Never true    Inability: Never true  . Transportation needs    Medical: No    Non-medical: No  Tobacco Use  . Smoking status: Never Smoker  . Smokeless tobacco: Never Used  Substance and Sexual Activity  . Alcohol use: No    Alcohol/week: 0.0 standard drinks  . Drug use: No  . Sexual activity: Not on file  Lifestyle  . Physical activity    Days per week: 0 days    Minutes per session: 0 min  . Stress: Not at all  Relationships  . Social Herbalist on phone: Patient refused    Gets together: Patient refused    Attends religious service: Patient refused    Active member of club or organization: Patient refused    Attends meetings of clubs or organizations: Patient refused    Relationship status: Patient refused  Other Topics Concern  . Not on file  Social History Narrative  . Not on file    Outpatient Encounter Medications as of 03/31/2019  Medication Sig  . atorvastatin (LIPITOR) 10 MG tablet   . Blood Glucose Monitoring Suppl (ONE TOUCH ULTRA 2) w/Device KIT Use as directed to check blood sugar once daily  . budesonide-formoterol (SYMBICORT) 160-4.5 MCG/ACT inhaler Inhale 2 puffs into the lungs 2 (two) times daily.  . Cholecalciferol (VITAMIN D3) 125 MCG (5000 UT) CAPS TAKE 1 CAPSULE EVERY DAY  . Choline Fenofibrate (FENOFIBRIC ACID) 135 MG CPDR Take 135 mg by mouth daily.   . Ferrous Sulfate (IRON) 325 (65 Fe) MG TABS Take 2 tablets by mouth daily.  . fluticasone (FLONASE) 50 MCG/ACT nasal spray Place 2 sprays into both nostrils daily.  . furosemide (LASIX) 20 MG tablet TAKE 1 TABLET (20 MG TOTAL) BY MOUTH DAILY.  Marland Kitchen glucose blood (ONE TOUCH ULTRA TEST) test strip Use as instructed to  check blood glucose daily  . levothyroxine (SYNTHROID, LEVOTHROID) 100 MCG tablet TAKE 1 TABLET (100 MCG TOTAL) BY MOUTH DAILY.  Marland Kitchen lisinopril (ZESTRIL) 20 MG tablet TAKE 1 TABLET EVERY DAY  . metFORMIN (GLUCOPHAGE-XR) 500 MG 24 hr tablet TAKE 2 TABLETS (1,000 MG TOTAL) EVERY EVENING  . Multiple Vitamins-Minerals (ONE-A-DAY WOMENS 50 PLUS PO) Take 1 tablet by mouth daily.  Marland Kitchen omeprazole (PRILOSEC) 20 MG capsule Take 20 mg by mouth daily.  Glory Rosebush DELICA LANCETS 34K MISC Use as directed to check blood glucose daily  . polyethylene glycol powder (GLYCOLAX/MIRALAX) 17 GM/SCOOP powder Please use one cup of miralax daily.  . potassium chloride (K-DUR,KLOR-CON) 10 MEQ tablet TAKE 1  TABLET (10 MEQ TOTAL) BY MOUTH DAILY.  Marland Kitchen SPIRIVA HANDIHALER 18 MCG inhalation capsule Place 1 capsule (18 mcg total) into inhaler and inhale daily.  . vitamin B-12 (CYANOCOBALAMIN) 1000 MCG tablet Take 1,000 mcg by mouth daily.  . vitamin C (ASCORBIC ACID) 500 MG tablet Take 500 mg by mouth daily.  . lansoprazole (PREVACID) 30 MG capsule TAKE 1 CAPSULE (30 MG TOTAL) BY MOUTH DAILY. (Patient not taking: Reported on 02/26/2019)   No facility-administered encounter medications on file as of 03/31/2019.     Activities of Daily Living In your present state of health, do you have any difficulty performing the following activities: 03/31/2019  Hearing? N  Vision? N  Comment Wears eye glasses daily.  Difficulty concentrating or making decisions? N  Walking or climbing stairs? Y  Comment Due to back pain.  Dressing or bathing? N  Doing errands, shopping? Y  Comment Does not drive.  Preparing Food and eating ? N  Using the Toilet? N  In the past six months, have you accidently leaked urine? N  Do you have problems with loss of bowel control? N  Managing your Medications? N  Managing your Finances? Y  Comment Sister assists with finances.  Housekeeping or managing your Housekeeping? N  Some recent data might be hidden     Patient Care Team: Virginia Crews, MD as PCP - General (Family Medicine) Gardiner Barefoot, DPM as Consulting Physician (Podiatry) Virgel Manifold, MD as Consulting Physician (Gastroenterology)    Assessment:   This is a routine wellness examination for [place].  Exercise Activities and Dietary recommendations Current Exercise Habits: The patient does not participate in regular exercise at present, Exercise limited by: None identified  Goals    . DIET - INCREASE WATER INTAKE     Recommend to drink at least 6-8 8oz glasses of water per day.       Fall Risk: Fall Risk  03/31/2019 03/26/2018 09/20/2017  Falls in the past year? 0 Yes Yes  Number falls in past yr: 0 1 1  Injury with Fall? 0 - Yes  Follow up - - Falls evaluation completed    [place]:  Any stairs in or around the home? Yes  If so, are there any without handrails? No   Home free of loose throw rugs in walkways, pet beds, electrical cords, etc? Yes  Adequate lighting in your home to reduce risk of falls? Yes   ASSISTIVE DEVICES UTILIZED TO PREVENT FALLS:  Life alert? No  Use of a cane, walker or w/c? No  Grab bars in the bathroom? Yes  Shower chair or bench in shower? Yes  Elevated toilet seat or a handicapped toilet? Yes   TIMED UP AND GO:  Was the test performed? No .    Depression Screen PHQ 2/9 Scores 03/31/2019 03/26/2018 03/26/2018 09/20/2017  PHQ - 2 Score 0 0 0 0  PHQ- 9 Score - 0 - -     Cognitive Function     6CIT Screen 03/31/2019  What Year? 0 points  What month? 0 points  What time? 0 points  Count back from 20 0 points  Months in reverse 0 points  Repeat phrase 2 points  Total Score 2    Immunization History  Administered Date(s) Administered  . Influenza,inj,Quad PF,6+ Mos 03/26/2018  . Pneumococcal Polysaccharide-23 03/26/2018  . Tdap 10/20/2010, 03/30/2019    Qualifies for Shingles Vaccine? Yes . Due for Shingrix. Education has  been provided regarding the importance of this vaccine. Pt has been advised to call insurance company to determine out of pocket expense. Advised may also receive vaccine at local pharmacy or Health Dept. Verbalized acceptance and understanding.  Tdap: Up to date  Flu Vaccine: Due for Flu vaccine. Does the patient want to receive this vaccine today?  No .     Screening Tests Health Maintenance  Topic Date Due  . INFLUENZA VACCINE  01/18/2019  . OPHTHALMOLOGY EXAM  01/24/2019  . FOOT EXAM  03/27/2019  . HEMOGLOBIN A1C  06/03/2019  . MAMMOGRAM  04/23/2020  . TETANUS/TDAP  10/19/2020  . PAP SMEAR-Modifier  03/26/2021  . COLONOSCOPY  04/24/2028  . PNEUMOCOCCAL POLYSACCHARIDE VACCINE AGE 58-64 HIGH RISK  Completed  . Hepatitis C Screening  Completed  . HIV Screening  Completed    Cancer Screenings:  Colorectal Screening: Completed 04/24/18. Repeat every 10 years.  Mammogram: Completed 04/23/18.    Lung Cancer Screening: (Low Dose CT Chest recommended if Age 54-80 years, 30 pack-year currently smoking OR have quit w/in 15years.) does not qualify.   Additional Screening:  Hepatitis C Screening: Up to date  Dental Screening: Recommended annual dental exams for proper oral hygiene   Community Resource Referral:  CRR required this visit?  No       Plan:  I have personally reviewed and addressed the Medicare Annual Wellness questionnaire and have noted the following in the patient's chart:  A. Medical and social history B. Use of alcohol, tobacco or illicit drugs  C. Current medications and supplements D. Functional ability and status E.  Nutritional status F.  Physical activity G. Advance directives H. List of other physicians I.  Hospitalizations, surgeries, and ER visits in previous 12 months J.  [place] such as hearing and vision if needed, cognitive and depression L. Referrals and appointments   In addition, I have reviewed and discussed with patient  certain preventive protocols, quality metrics, and best practice recommendations. A written personalized care plan for preventive services as well as general preventive health recommendations were provided to patient.   Glendora Score, [place]  [id number]/[id number] Nurse Health Advisor   Nurse Notes: Pt declined a flu shot. Pt to set up an eye exam soon.

## 2019-03-31 ENCOUNTER — Ambulatory Visit (INDEPENDENT_AMBULATORY_CARE_PROVIDER_SITE_OTHER): Payer: Medicare Other

## 2019-03-31 ENCOUNTER — Telehealth: Payer: Self-pay

## 2019-03-31 ENCOUNTER — Other Ambulatory Visit: Payer: Self-pay

## 2019-03-31 DIAGNOSIS — Z0000 Encounter for general adult medical examination without abnormal findings: Secondary | ICD-10-CM | POA: Diagnosis not present

## 2019-03-31 NOTE — Telephone Encounter (Signed)
Patient is calling to find out if her Hepatic function panel was back yet. Informed patient that it was but has not been reviewed by the doctor. Informed patient would call her back as soon as the results are reviewed.

## 2019-03-31 NOTE — Patient Instructions (Signed)
[name] , Thank you for taking time to come for your Medicare Wellness Visit. I appreciate your ongoing commitment to your health goals. Please review the following plan we discussed and let me know if I can assist you in the future.   Screening recommendations/referrals: Colonoscopy: Up to date, due 04/2028 Mammogram: Up to date, due 04/2020 Recommended yearly ophthalmology/optometry visit for glaucoma screening and checkup Recommended yearly dental visit for hygiene and checkup  Vaccinations: Influenza vaccine: Currently due Tdap vaccine: Up to date, due 10/2020 Shingles vaccine: Pt declines today.     Advanced directives: Currently on file.   Conditions/risks identified: Recommend to drink at least 6-8 8oz glasses of water per day.  Next appointment: 06/23/19 @ 9:00 AM with Dr Brita Romp.   Preventive Care 40-64 Years, Female Preventive care refers to lifestyle choices and visits with your health care provider that can promote health and wellness. What does preventive care include?  A yearly physical exam. This is also called an annual well check.  Dental exams once or twice a year.  Routine eye exams. Ask your health care provider how often you should have your eyes checked.  Personal lifestyle choices, including:  Daily care of your teeth and gums.  Regular physical activity.  Eating a healthy diet.  Avoiding tobacco and drug use.  Limiting alcohol use.  Practicing safe sex.  Taking low-dose aspirin daily starting at age 63.  Taking vitamin and mineral supplements as recommended by your health care provider. What happens during an annual well check? The services and screenings done by your health care provider during your annual well check will depend on your age, overall health, lifestyle risk factors, and family history of disease. Counseling  Your health care provider may ask you questions about your:  Alcohol use.  Tobacco use.  Drug use.  Emotional  well-being.  Home and relationship well-being.  Sexual activity.  Eating habits.  Work and work Statistician.  Method of birth control.  Menstrual cycle.  Pregnancy history. Screening  You may have the following tests or measurements:  Height, weight, and BMI.  Blood pressure.  Lipid and cholesterol levels. These may be checked every 5 years, or more frequently if you are over 76 years old.  Skin check.  Lung cancer screening. You may have this screening every year starting at age 80 if you have a 30-pack-year history of smoking and currently smoke or have quit within the past 15 years.  Fecal occult blood test (FOBT) of the stool. You may have this test every year starting at age 22.  Flexible sigmoidoscopy or colonoscopy. You may have a sigmoidoscopy every 5 years or a colonoscopy every 10 years starting at age 68.  Hepatitis C blood test.  Hepatitis B blood test.  Sexually transmitted disease (STD) testing.  Diabetes screening. This is done by checking your blood sugar (glucose) after you have not eaten for a while (fasting). You may have this done every 1-3 years.  Mammogram. This may be done every 1-2 years. Talk to your health care provider about when you should start having regular mammograms. This may depend on whether you have a family history of breast cancer.  BRCA-related cancer screening. This may be done if you have a family history of breast, ovarian, tubal, or peritoneal cancers.  Pelvic exam and Pap test. This may be done every 3 years starting at age 66. Starting at age 21, this may be done every 5 years if you have a Pap  test in combination with an HPV test.  Bone density scan. This is done to screen for osteoporosis. You may have this scan if you are at high risk for osteoporosis. Discuss your test results, treatment options, and if necessary, the need for more tests with your health care provider. Vaccines  Your health care provider may recommend  certain vaccines, such as:  Influenza vaccine. This is recommended every year.  Tetanus, diphtheria, and acellular pertussis (Tdap, Td) vaccine. You may need a Td booster every 10 years.  Zoster vaccine. You may need this after age 68.  Pneumococcal 13-valent conjugate (PCV13) vaccine. You may need this if you have certain conditions and were not previously vaccinated.  Pneumococcal polysaccharide (PPSV23) vaccine. You may need one or two doses if you smoke cigarettes or if you have certain conditions. Talk to your health care provider about which screenings and vaccines you need and how often you need them. This information is not intended to replace advice given to you by your health care provider. Make sure you discuss any questions you have with your health care provider. Document Released: 07/02/2015 Document Revised: 02/23/2016 Document Reviewed: 04/06/2015 Elsevier Interactive Patient Education  2017 [place] Prevention in the Home Falls can cause injuries. They can happen to people of all ages. There are many things you can do to make your home safe and to help prevent falls. What can I do on the outside of my home?  Regularly fix the edges of walkways and driveways and fix any cracks.  Remove anything that might make you trip as you walk through a door, such as a raised step or threshold.  Trim any bushes or trees on the path to your home.  Use bright outdoor lighting.  Clear any walking paths of anything that might make someone trip, such as rocks or tools.  Regularly check to see if handrails are loose or broken. Make sure that both sides of any steps have handrails.  Any raised decks and porches should have guardrails on the edges.  Have any leaves, snow, or ice cleared regularly.  Use sand or salt on walking paths during winter.  Clean up any spills in your garage right away. This includes oil or grease spills. What can I do in the bathroom?  Use  night lights.  Install grab bars by the toilet and in the tub and shower. Do not use towel bars as grab bars.  Use non-skid mats or decals in the tub or shower.  If you need to sit down in the shower, use a plastic, non-slip stool.  Keep the floor dry. Clean up any water that spills on the floor as soon as it happens.  Remove soap buildup in the tub or shower regularly.  Attach bath mats securely with double-sided non-slip rug tape.  Do not have throw rugs and other things on the floor that can make you trip. What can I do in the bedroom?  Use night lights.  Make sure that you have a light by your bed that is easy to reach.  Do not use any sheets or blankets that are too big for your bed. They should not hang down onto the floor.  Have a firm chair that has side arms. You can use this for support while you get dressed.  Do not have throw rugs and other things on the floor that can make you trip. What can I do in the kitchen?  Clean up  any spills right away.  Avoid walking on wet floors.  Keep items that you use a lot in easy-to-reach places.  If you need to reach something above you, use a strong step stool that has a grab bar.  Keep electrical cords out of the way.  Do not use floor polish or wax that makes floors slippery. If you must use wax, use non-skid floor wax.  Do not have throw rugs and other things on the floor that can make you trip. What can I do with my stairs?  Do not leave any items on the stairs.  Make sure that there are handrails on both sides of the stairs and use them. Fix handrails that are broken or loose. Make sure that handrails are as long as the stairways.  Check any carpeting to make sure that it is firmly attached to the stairs. Fix any carpet that is loose or worn.  Avoid having throw rugs at the top or bottom of the stairs. If you do have throw rugs, attach them to the floor with carpet tape.  Make sure that you have a light switch at  the top of the stairs and the bottom of the stairs. If you do not have them, ask someone to add them for you. What else can I do to help prevent falls?  Wear shoes that:  Do not have high heels.  Have rubber bottoms.  Are comfortable and fit you well.  Are closed at the toe. Do not wear sandals.  If you use a stepladder:  Make sure that it is fully opened. Do not climb a closed stepladder.  Make sure that both sides of the stepladder are locked into place.  Ask someone to hold it for you, if possible.  Clearly mark and make sure that you can see:  Any grab bars or handrails.  First and last steps.  Where the edge of each step is.  Use tools that help you move around (mobility aids) if they are needed. These include:  Canes.  Walkers.  Scooters.  Crutches.  Turn on the lights when you go into a dark area. Replace any light bulbs as soon as they burn out.  Set up your furniture so you have a clear path. Avoid moving your furniture around.  If any of your floors are uneven, fix them.  If there are any pets around you, be aware of where they are.  Review your medicines with your doctor. Some medicines can make you feel dizzy. This can increase your chance of falling. Ask your doctor what other things that you can do to help prevent falls. This information is not intended to replace advice given to you by your health care provider. Make sure you discuss any questions you have with your health care provider. Document Released: 04/01/2009 Document Revised: 11/11/2015 Document Reviewed: 07/10/2014 Elsevier Interactive Patient Education  2017 [employer].

## 2019-04-08 ENCOUNTER — Other Ambulatory Visit: Payer: Self-pay | Admitting: Gastroenterology

## 2019-04-08 ENCOUNTER — Telehealth: Payer: Self-pay

## 2019-04-08 DIAGNOSIS — R748 Abnormal levels of other serum enzymes: Secondary | ICD-10-CM

## 2019-04-08 NOTE — Telephone Encounter (Signed)
-----   Message from Virgel Manifold, MD sent at 04/08/2019  3:14 PM EDT ----- Caryl Pina please let the patient know, her alkaline phosphatase is still mildly elevated.  I have ordered further blood work for this to see if it is coming from the liver

## 2019-04-08 NOTE — Telephone Encounter (Signed)
Patient sister verbalized understanding. She states she will take her [facility] went to last time she had blood work done.

## 2019-04-13 DIAGNOSIS — Z23 Encounter for immunization: Secondary | ICD-10-CM | POA: Diagnosis not present

## 2019-04-16 LAB — GAMMA GT: GGT: 20 IU/L (ref 0–60)

## 2019-04-16 LAB — MITOCHONDRIAL ANTIBODIES: Mitochondrial Ab: 20.6 Units — ABNORMAL HIGH (ref 0.0–20.0)

## 2019-04-25 DIAGNOSIS — N6489 Other specified disorders of breast: Secondary | ICD-10-CM | POA: Diagnosis not present

## 2019-04-25 DIAGNOSIS — R928 Other abnormal and inconclusive findings on diagnostic imaging of breast: Secondary | ICD-10-CM | POA: Diagnosis not present

## 2019-04-28 ENCOUNTER — Ambulatory Visit (INDEPENDENT_AMBULATORY_CARE_PROVIDER_SITE_OTHER): Payer: Medicare Other | Admitting: Podiatry

## 2019-04-28 ENCOUNTER — Encounter: Payer: Self-pay | Admitting: Podiatry

## 2019-04-28 ENCOUNTER — Other Ambulatory Visit: Payer: Self-pay

## 2019-04-28 DIAGNOSIS — M79676 Pain in unspecified toe(s): Secondary | ICD-10-CM

## 2019-04-28 DIAGNOSIS — B351 Tinea unguium: Secondary | ICD-10-CM | POA: Diagnosis not present

## 2019-04-28 DIAGNOSIS — E119 Type 2 diabetes mellitus without complications: Secondary | ICD-10-CM | POA: Diagnosis not present

## 2019-04-28 NOTE — Progress Notes (Signed)
Complaint:  Visit Type: Patient returns to my office for continued preventative foot care services. Complaint: Patient states" my nails have grown long and thick and become painful to walk and wear shoes" Patient has been diagnosed with DM with no foot complications. The patient presents for preventative foot care services. No changes to ROS.    Podiatric Exam: Vascular: dorsalis pedis and posterior tibial pulses are palpable bilateral. Capillary return is immediate. Temperature gradient is WNL. Skin turgor WNL  Sensorium: Normal Semmes Weinstein monofilament test. Normal tactile sensation bilaterally. Nail Exam: Pt has elongated ingrown toenails  B/L.Marland Kitchen  Thick disfigured hallux nails  B/l. Ulcer Exam: There is no evidence of ulcer or pre-ulcerative changes or infection. Orthopedic Exam: Muscle tone and strength are WNL. No limitations in general ROM. No crepitus or effusions noted. Foot type and digits show no abnormalities. Bony prominences are unremarkable. Painful bone 5th toe left foot. Skin: No Porokeratosis. No infection or ulcers.   Diagnosis:  Onychomycosis  Hallux  B/L, Pain in right toe, pain in left toes .  Treatment & Plan Procedures and Treatment: Consent by patient was obtained for treatment procedures.   Debridement of mycotic and hypertrophic toenails, 1 through 5 bilateral and clearing of subungual debris. No ulceration, no infection noted.  Return Visit-Office Procedure: Patient instructed to return to the office for a follow up visit 3 months   for continued evaluation and treatment.    Gardiner Barefoot DPM

## 2019-05-19 ENCOUNTER — Ambulatory Visit: Payer: Medicare Other | Admitting: Podiatry

## 2019-05-30 ENCOUNTER — Telehealth: Payer: Self-pay

## 2019-05-30 ENCOUNTER — Other Ambulatory Visit: Payer: Self-pay

## 2019-05-30 DIAGNOSIS — Z20822 Contact with and (suspected) exposure to covid-19: Secondary | ICD-10-CM

## 2019-05-30 DIAGNOSIS — Z20828 Contact with and (suspected) exposure to other viral communicable diseases: Secondary | ICD-10-CM | POA: Diagnosis not present

## 2019-05-30 NOTE — Telephone Encounter (Signed)
Patient reports she has been sick for two days. Patient reports she is having nasal congestion, ear pain, headache, cough and sore throat. Patient denies any fever, chills, shortness of breath or painful breathing. Patient reports temperature today was 99.2. Patient's sister June reports that she gave Emma Fletcher Tylenol Cold and sinus. Mrs. June is requesting we send medication to [facility] on [address]

## 2019-05-30 NOTE — Telephone Encounter (Signed)
Patient advised as below. Patient verbalizes understanding and is in agreement with treatment plan.  

## 2019-05-30 NOTE — Telephone Encounter (Signed)
Copied from [place] [id number]. Topic: General - Inquiry >> May 30, 2019  8:53 AM Mathis Bud wrote: Reason for CRM: Patient sister called (June) called regarding patient is starting a cold and would like something called into the pharmacy. Call back [phone] PHARMACY [phone]

## 2019-05-30 NOTE — Telephone Encounter (Signed)
There is no prescription therapy for a viral illness.  Use supportive OTC therapies to treat symptoms, such as mucinex, delsym, etc.  I would also recommend COVID testing.  She can go today anytime before 3pm or it will be by appt starting on Monday.

## 2019-05-31 LAB — NOVEL CORONAVIRUS, NAA: SARS-CoV-2, NAA: NOT DETECTED

## 2019-06-02 DIAGNOSIS — I447 Left bundle-branch block, unspecified: Secondary | ICD-10-CM | POA: Diagnosis not present

## 2019-06-02 DIAGNOSIS — I10 Essential (primary) hypertension: Secondary | ICD-10-CM | POA: Diagnosis not present

## 2019-06-02 DIAGNOSIS — I471 Supraventricular tachycardia: Secondary | ICD-10-CM | POA: Diagnosis not present

## 2019-06-02 DIAGNOSIS — I5032 Chronic diastolic (congestive) heart failure: Secondary | ICD-10-CM | POA: Diagnosis not present

## 2019-06-02 DIAGNOSIS — I350 Nonrheumatic aortic (valve) stenosis: Secondary | ICD-10-CM | POA: Diagnosis not present

## 2019-06-11 ENCOUNTER — Ambulatory Visit: Payer: Medicaid Other | Admitting: Gastroenterology

## 2019-06-16 ENCOUNTER — Other Ambulatory Visit: Payer: Self-pay | Admitting: Family Medicine

## 2019-06-23 ENCOUNTER — Other Ambulatory Visit: Payer: Self-pay

## 2019-06-23 ENCOUNTER — Ambulatory Visit (INDEPENDENT_AMBULATORY_CARE_PROVIDER_SITE_OTHER): Payer: Medicare Other | Admitting: Family Medicine

## 2019-06-23 ENCOUNTER — Encounter: Payer: Self-pay | Admitting: Family Medicine

## 2019-06-23 VITALS — BP 136/76 | HR 72 | Temp 97.1°F | Wt 223.0 lb

## 2019-06-23 DIAGNOSIS — E785 Hyperlipidemia, unspecified: Secondary | ICD-10-CM

## 2019-06-23 DIAGNOSIS — E559 Vitamin D deficiency, unspecified: Secondary | ICD-10-CM | POA: Diagnosis not present

## 2019-06-23 DIAGNOSIS — D638 Anemia in other chronic diseases classified elsewhere: Secondary | ICD-10-CM

## 2019-06-23 DIAGNOSIS — I429 Cardiomyopathy, unspecified: Secondary | ICD-10-CM | POA: Diagnosis not present

## 2019-06-23 DIAGNOSIS — E1169 Type 2 diabetes mellitus with other specified complication: Secondary | ICD-10-CM | POA: Diagnosis not present

## 2019-06-23 DIAGNOSIS — D513 Other dietary vitamin B12 deficiency anemia: Secondary | ICD-10-CM

## 2019-06-23 DIAGNOSIS — E039 Hypothyroidism, unspecified: Secondary | ICD-10-CM

## 2019-06-23 DIAGNOSIS — I10 Essential (primary) hypertension: Secondary | ICD-10-CM | POA: Diagnosis not present

## 2019-06-23 DIAGNOSIS — N182 Chronic kidney disease, stage 2 (mild): Secondary | ICD-10-CM

## 2019-06-23 DIAGNOSIS — J449 Chronic obstructive pulmonary disease, unspecified: Secondary | ICD-10-CM | POA: Diagnosis not present

## 2019-06-23 DIAGNOSIS — Z1231 Encounter for screening mammogram for malignant neoplasm of breast: Secondary | ICD-10-CM | POA: Diagnosis not present

## 2019-06-23 NOTE — Assessment & Plan Note (Signed)
Noted in patient's chart, likely 2/2 HTN BP control important

## 2019-06-23 NOTE — Assessment & Plan Note (Signed)
Status post work-up with hematology Continue to recheck B12 and CBC twice annually

## 2019-06-23 NOTE — Assessment & Plan Note (Signed)
Recently started on atorvastatin Tolerating well Continue atorvastatin 10 mg daily Recheck FLP and CMP

## 2019-06-23 NOTE — Assessment & Plan Note (Addendum)
Associated with hypertension, hyperlipidemia Previously well controlled No hypoglycemic events On statin and ACE inhibitor Continue Metformin XR 1000 mg daily Recheck A1c Up-to-date on foot exam and vaccinations Encouraged her to schedule follow-up eye exam Follow-up in 6 months

## 2019-06-23 NOTE — Patient Instructions (Signed)
Preventive Care 63-63 Years Old, Female Preventive care refers to visits with your health care provider and lifestyle choices that can promote health and wellness. This includes:  A yearly physical exam. This may also be called an annual well check.  Regular dental visits and eye exams.  Immunizations.  Screening for certain conditions.  Healthy lifestyle choices, such as eating a healthy diet, getting regular exercise, not using drugs or products that contain nicotine and tobacco, and limiting alcohol use. What can I expect for my preventive care visit? Physical exam Your health care provider will check your:  Height and weight. This may be used to calculate body mass index (BMI), which tells if you are at a healthy weight.  Heart rate and blood pressure.  Skin for abnormal spots. Counseling Your health care provider may ask you questions about your:  Alcohol, tobacco, and drug use.  Emotional well-being.  Home and relationship well-being.  Sexual activity.  Eating habits.  Work and work environment.  Method of birth control.  Menstrual cycle.  Pregnancy history. What immunizations do I need?  Influenza (flu) vaccine  This is recommended every year. Tetanus, diphtheria, and pertussis (Tdap) vaccine  You may need a Td booster every 10 years. Varicella (chickenpox) vaccine  You may need this if you have not been vaccinated. Zoster (shingles) vaccine  You may need this after age 63. Measles, mumps, and rubella (MMR) vaccine  You may need at least one dose of MMR if you were born in 1957 or later. You may also need a second dose. Pneumococcal conjugate (PCV13) vaccine  You may need this if you have certain conditions and were not previously vaccinated. Pneumococcal polysaccharide (PPSV23) vaccine  You may need one or two doses if you smoke cigarettes or if you have certain conditions. Meningococcal conjugate (MenACWY) vaccine  You may need this if you  have certain conditions. Hepatitis A vaccine  You may need this if you have certain conditions or if you travel or work in places where you may be exposed to hepatitis A. Hepatitis B vaccine  You may need this if you have certain conditions or if you travel or work in places where you may be exposed to hepatitis B. Haemophilus influenzae type b (Hib) vaccine  You may need this if you have certain conditions. Human papillomavirus (HPV) vaccine  If recommended by your health care provider, you may need three doses over 6 months. You may receive vaccines as individual doses or as more than one vaccine together in one shot (combination vaccines). Talk with your health care provider about the risks and benefits of combination vaccines. What tests do I need? Blood tests  Lipid and cholesterol levels. These may be checked every 5 years, or more frequently if you are over 63 years old.  Hepatitis C test.  Hepatitis B test. Screening  Lung cancer screening. You may have this screening every year starting at age 63 if you have a 30-pack-year history of smoking and currently smoke or have quit within the past 15 years.  Colorectal cancer screening. All adults should have this screening starting at age 63 and continuing until age 75. Your health care provider may recommend screening at age 63 if you are at increased risk. You will have tests every 1-10 years, depending on your results and the type of screening test.  Diabetes screening. This is done by checking your blood sugar (glucose) after you have not eaten for a while (fasting). You may have this   done every 1-3 years.  Mammogram. This may be done every 1-2 years. Talk with your health care provider about when you should start having regular mammograms. This may depend on whether you have a family history of breast cancer.  BRCA-related cancer screening. This may be done if you have a family history of breast, ovarian, tubal, or peritoneal  cancers.  Pelvic exam and Pap test. This may be done every 3 years starting at age 63. Starting at age 63, this may be done every 5 years if you have a Pap test in combination with an HPV test. Other tests  Sexually transmitted disease (STD) testing.  Bone density scan. This is done to screen for osteoporosis. You may have this scan if you are at high risk for osteoporosis. Follow these instructions at home: Eating and drinking  Eat a diet that includes fresh fruits and vegetables, whole grains, lean protein, and low-fat dairy.  Take vitamin and mineral supplements as recommended by your health care provider.  Do not drink alcohol if: ? Your health care provider tells you not to drink. ? You are pregnant, may be pregnant, or are planning to become pregnant.  If you drink alcohol: ? Limit how much you have to 0-1 drink a day. ? Be aware of how much alcohol is in your drink. In the [place] one drink equals one 12 oz bottle of beer (355 mL), one 5 oz glass of wine (148 mL), or one 1 oz glass of hard liquor (44 mL). Lifestyle  Take daily care of your teeth and gums.  Stay active. Exercise for at least 30 minutes on 5 or more days each week.  Do not use any products that contain nicotine or tobacco, such as cigarettes, e-cigarettes, and chewing tobacco. If you need help quitting, ask your health care provider.  If you are sexually active, practice safe sex. Use a condom or other form of birth control (contraception) in order to prevent pregnancy and STIs (sexually transmitted infections).  If told by your health care provider, take low-dose aspirin daily starting at age 63. What's next?  Visit your health care provider once a year for a well check visit.  Ask your health care provider how often you should have your eyes and teeth checked.  Stay up to date on all vaccines. This information is not intended to replace advice given to you by your health care provider. Make sure you  discuss any questions you have with your health care provider. Document Revised: 02/14/2018 Document Reviewed: 02/14/2018 Elsevier Patient Education  2020 [employer].

## 2019-06-23 NOTE — Progress Notes (Deleted)
Patient: Emma Fletcher, Female    DOB: 1956-02-20, 64 y.o.   MRN: [id number] Visit Date: 06/23/2019  Today's Provider: Lavon Paganini, MD   No chief complaint on file.  Subjective:     Annual physical exam Emma Fletcher is a 64 y.o. female who presents today for health maintenance and complete physical. She feels {DESC; WELL/FAIRLY WELL/POORLY:18703}. She reports exercising ***. She reports she is sleeping {DESC; WELL/FAIRLY WELL/POORLY:18703}.  -----------------------------------------------------------------   Review of Systems  Constitutional: Negative.   HENT: Negative.   Eyes: Negative.   Respiratory: Negative.   Cardiovascular: Negative.   Gastrointestinal: Negative.   Endocrine: Negative.   Genitourinary: Negative.   Musculoskeletal: Negative.   Skin: Negative.   Allergic/Immunologic: Negative.   Neurological: Negative.   Hematological: Negative.   Psychiatric/Behavioral: Negative.     Social History      She  reports that she has never smoked. She has never used smokeless tobacco. She reports that she does not drink alcohol or use drugs.       Social History   Socioeconomic History  . Marital status: Single    Spouse name: Not on file  . Number of children: 0  . Years of education: Not on file  . Highest education level: High school graduate  Occupational History  . Occupation: Part time works in [place] Use  . Smoking status: Never Smoker  . Smokeless tobacco: Never Used  Substance and Sexual Activity  . Alcohol use: No    Alcohol/week: 0.0 standard drinks  . Drug use: No  . Sexual activity: Not on file  Other Topics Concern  . Not on file  Social History Narrative  . Not on file   Social Determinants of Health   Financial Resource Strain: Low Risk   . Difficulty of Paying Living Expenses: Not hard at all  Food Insecurity: No Food Insecurity  . Worried About Charity fundraiser in the Last Year: Never true  . Ran  Out of Food in the Last Year: Never true  Transportation Needs: No Transportation Needs  . Lack of Transportation (Medical): No  . Lack of Transportation (Non-Medical): No  Physical Activity: Inactive  . Days of Exercise per Week: 0 days  . Minutes of Exercise per Session: 0 min  Stress: No Stress Concern Present  . Feeling of Stress : Not at all  Social Connections: Unknown  . Frequency of Communication with Friends and Family: Patient refused  . Frequency of Social Gatherings with Friends and Family: Patient refused  . Attends Religious Services: Patient refused  . Active Member of Clubs or Organizations: Patient refused  . Attends Archivist Meetings: Patient refused  . Marital Status: Patient refused    Past Medical History:  Diagnosis Date  . Anemia   . Bronchitis   . Cardiomyopathy secondary   . Developmental delay   . Diabetes mellitus without complication ([place])   . Edema   . Hypercholesterolemia   . Unspecified essential hypertension      Patient Active Problem List   Diagnosis Date Noted  . HLD (hyperlipidemia) 09/20/2017  . Hypothyroidism 09/20/2017  . Dysfunction of both eustachian tubes 09/20/2017  . CKD (chronic kidney disease), stage II 05/16/2016  . T2DM (type 2 diabetes mellitus) ([place]) 12/18/2015  . Hyponatremia 12/18/2015  . Anemia 12/18/2015  . Essential hypertension 11/10/2008  . Secondary cardiomyopathy ([place]) 11/10/2008  . EDEMA 11/10/2008    Past Surgical History:  Procedure  Laterality Date  . COLONOSCOPY    . COLONOSCOPY WITH PROPOFOL N/A 04/24/2018   Procedure: COLONOSCOPY WITH PROPOFOL;  Surgeon: Jonathon Bellows, MD;  Location: [facility] ENDOSCOPY;  Service: Gastroenterology;  Laterality: N/A;  . none      Family History        Family Status  Relation Name Status  . Mother  Deceased at age 63  . Father  Deceased at age 22  . Mat Aunt  (Not Specified)  . Mat Uncle  (Not Specified)  . Annamarie Major  (Not Specified)  . Other  (Not  Specified)        Her family history includes Breast cancer in her maternal aunt; Coronary artery disease in her father; Diabetes in her father and mother; Hypertension in her mother; Lung cancer in her maternal uncle; Prostate cancer in her paternal uncle; Stroke in her mother; Uterine cancer in an other family member.      No Known Allergies   Current Outpatient Medications:  .  atorvastatin (LIPITOR) 10 MG tablet, , Disp: , Rfl:  .  Blood Glucose Monitoring Suppl (ONE TOUCH ULTRA 2) w/Device KIT, Use as directed to check blood sugar once daily, Disp: 1 each, Rfl: 0 .  budesonide-formoterol (SYMBICORT) 160-4.5 MCG/ACT inhaler, Inhale 2 puffs into the lungs 2 (two) times daily., Disp: 3 Inhaler, Rfl: 3 .  Cholecalciferol (VITAMIN D3) 125 MCG (5000 UT) CAPS, TAKE 1 CAPSULE EVERY DAY, Disp: 90 capsule, Rfl: 3 .  Choline Fenofibrate (FENOFIBRIC ACID) 135 MG CPDR, Take 135 mg by mouth daily. , Disp: , Rfl:  .  Ferrous Sulfate (IRON) 325 (65 Fe) MG TABS, Take 2 tablets by mouth daily., Disp: , Rfl:  .  fluticasone (FLONASE) 50 MCG/ACT nasal spray, Place 2 sprays into both nostrils daily., Disp: 16 g, Rfl: 6 .  furosemide (LASIX) 20 MG tablet, TAKE 1 TABLET (20 MG TOTAL) BY MOUTH DAILY., Disp: 90 tablet, Rfl: 2 .  glucose blood (ONE TOUCH ULTRA TEST) test strip, Use as instructed to check blood glucose daily, Disp: 100 each, Rfl: 3 .  lansoprazole (PREVACID) 30 MG capsule, TAKE 1 CAPSULE (30 MG TOTAL) BY MOUTH DAILY., Disp: 90 capsule, Rfl: 2 .  levothyroxine (SYNTHROID, LEVOTHROID) 100 MCG tablet, TAKE 1 TABLET (100 MCG TOTAL) BY MOUTH DAILY., Disp: 90 tablet, Rfl: 3 .  lisinopril (ZESTRIL) 20 MG tablet, TAKE 1 TABLET EVERY DAY, Disp: 90 tablet, Rfl: 2 .  metFORMIN (GLUCOPHAGE-XR) 500 MG 24 hr tablet, TAKE 2 TABLETS (1,000 MG TOTAL) EVERY EVENING, Disp: 180 tablet, Rfl: 3 .  Multiple Vitamins-Minerals (ONE-A-DAY WOMENS 50 PLUS PO), Take 1 tablet by mouth daily., Disp: , Rfl:  .  omeprazole  (PRILOSEC) 20 MG capsule, Take 20 mg by mouth daily., Disp: , Rfl:  .  ONETOUCH DELICA LANCETS 77N MISC, Use as directed to check blood glucose daily, Disp: 100 each, Rfl: 5 .  polyethylene glycol powder (GLYCOLAX/MIRALAX) 17 GM/SCOOP powder, Please use one cup of miralax daily., Disp: 255 g, Rfl: 3 .  potassium chloride (KLOR-CON) 10 MEQ tablet, TAKE 1 TABLET (10 MEQ TOTAL) BY MOUTH DAILY., Disp: 90 tablet, Rfl: 2 .  SPIRIVA HANDIHALER 18 MCG inhalation capsule, Place 1 capsule (18 mcg total) into inhaler and inhale daily., Disp: 90 capsule, Rfl: 3 .  vitamin B-12 (CYANOCOBALAMIN) 1000 MCG tablet, Take 1,000 mcg by mouth daily., Disp: , Rfl:  .  vitamin C (ASCORBIC ACID) 500 MG tablet, Take 500 mg by mouth daily., Disp: , Rfl:  Patient Care Team: Virginia Crews, MD as PCP - General (Family Medicine) Gardiner Barefoot, DPM as Consulting Physician (Podiatry) Virgel Manifold, MD as Consulting Physician (Gastroenterology)    Objective:    Vitals: LMP  (LMP Unknown)   There were no vitals filed for this visit.   Physical Exam   Depression Screen PHQ 2/9 Scores 03/31/2019 03/26/2018 03/26/2018 09/20/2017  PHQ - 2 Score 0 0 0 0  PHQ- 9 Score - 0 - -       Assessment & Plan:     Routine Health Maintenance and Physical Exam  Exercise Activities and Dietary recommendations Goals    . DIET - INCREASE WATER INTAKE     Recommend to drink at least 6-8 8oz glasses of water per day.       Immunization History  Administered Date(s) Administered  . Influenza,inj,Quad PF,6+ Mos 03/26/2018  . Pneumococcal Polysaccharide-23 03/26/2018  . Tdap 10/20/2010, 03/30/2019    Health Maintenance  Topic Date Due  . INFLUENZA VACCINE  01/18/2019  . OPHTHALMOLOGY EXAM  01/24/2019  . HEMOGLOBIN A1C  06/03/2019  . MAMMOGRAM  04/23/2020  . FOOT EXAM  04/27/2020  . PAP SMEAR-Modifier  03/26/2021  . COLONOSCOPY  04/24/2028  . TETANUS/TDAP  03/29/2029  . PNEUMOCOCCAL POLYSACCHARIDE  VACCINE AGE 70-64 HIGH RISK  Completed  . Hepatitis C Screening  Completed  . HIV Screening  Completed     Discussed health benefits of physical activity, and encouraged her to engage in regular exercise appropriate for her age and condition.    --------------------------------------------------------------------    Lavon Paganini, MD  [place]

## 2019-06-23 NOTE — Assessment & Plan Note (Signed)
Recheck metabolic panel Needs good control of hypertension and diabetes and avoidance of nephrotoxic medications

## 2019-06-23 NOTE — Assessment & Plan Note (Signed)
Well controlled Continue current medications Recheck metabolic panel F/u in 6 months  

## 2019-06-23 NOTE — Assessment & Plan Note (Signed)
Chronic and stable Continue Spiriva

## 2019-06-23 NOTE — Progress Notes (Signed)
Patient: Emma Fletcher Female    DOB: [birth year]   64 y.o.   MRN: [id number] Visit Date: 06/23/2019  Today's Provider: Lavon Paganini, MD   Chief Complaint  Patient presents with  . Diabetes  . Hypertension  . Hyperlipidemia   Subjective:   Patient seen by McKenzie for AWV on 03/31/2019  HPI  Diabetes Mellitus Type II, Follow-up:  Lab Results  Component Value Date   HGBA1C 7.1 (H) 12/02/2018   Last seen for diabetes 5 months ago.  Management since then includes no changes She reports good compliance with treatment. She is not having side effects.  Current symptoms include none Home blood sugar records: fasting range:140-160's  Episodes of hypoglycemia? No               Current Insulin Regimen: none Most Recent Eye Exam: 01/23/2018 Weight trend: stable Current diet: well balanced Current exercise: 2 times per day on stationary bicycle  ------------------------------------------------------------------------   Hypertension, follow-up: BP Readings from Last 3 Encounters:  06/23/19 136/76  02/26/19 123/72  02/14/19 126/69   She was last seen for hypertension 5 months ago.  BP at that visit was . Management since that visit includes no change. She reports good compliance with treatment. She is not having side effects.  She is exercising. She is adherent to low salt diet.   Outside blood pressures are not being checked. She is experiencing none.  Patient denies chest pain, chest pressure/discomfort, claudication, dyspnea, exertional chest pressure/discomfort, fatigue, irregular heart beat, lower extremity edema, near-syncope, orthopnea, palpitations, paroxysmal nocturnal dyspnea, syncope and tachypnea.   Cardiovascular risk factors include diabetes mellitus, dyslipidemia and hypertension.  Use of agents associated with hypertension: thyroid hormones.   ------------------------------------------------------------------------     Lipid/Cholesterol, Follow-up:   Last seen for this 5 months ago.  Management since that visit includes no change.  Last Lipid Panel: Lab Results  Component Value Date   CHOL 194 12/02/2018   HDL 59 12/02/2018   LDLCALC 120 (H) 12/02/2018   TRIG 74 12/02/2018   CHOLHDL 3.3 12/02/2018   She reports good compliance with treatment. She is not having side effects.   Wt Readings from Last 3 Encounters:  06/23/19 223 lb (101.2 kg)  02/26/19 216 lb 4 oz (98.1 kg)  07/02/18 219 lb (99.3 kg)   ------------------------------------------------------------------------  No Known Allergies   Current Outpatient Medications:  .  atorvastatin (LIPITOR) 10 MG tablet, , Disp: , Rfl:  .  Blood Glucose Monitoring Suppl (ONE TOUCH ULTRA 2) w/Device KIT, Use as directed to check blood sugar once daily, Disp: 1 each, Rfl: 0 .  budesonide-formoterol (SYMBICORT) 160-4.5 MCG/ACT inhaler, Inhale 2 puffs into the lungs 2 (two) times daily., Disp: 3 Inhaler, Rfl: 3 .  Cholecalciferol (VITAMIN D3) 125 MCG (5000 UT) CAPS, TAKE 1 CAPSULE EVERY DAY, Disp: 90 capsule, Rfl: 3 .  Choline Fenofibrate (FENOFIBRIC ACID) 135 MG CPDR, Take 135 mg by mouth daily. , Disp: , Rfl:  .  Ferrous Sulfate (IRON) 325 (65 Fe) MG TABS, Take 2 tablets by mouth daily., Disp: , Rfl:  .  fluticasone (FLONASE) 50 MCG/ACT nasal spray, Place 2 sprays into both nostrils daily., Disp: 16 g, Rfl: 6 .  furosemide (LASIX) 20 MG tablet, TAKE 1 TABLET (20 MG TOTAL) BY MOUTH DAILY., Disp: 90 tablet, Rfl: 2 .  glucose blood (ONE TOUCH ULTRA TEST) test strip, Use as instructed to check blood glucose daily, Disp: 100 each, Rfl: 3 .  lansoprazole (  PREVACID) 30 MG capsule, TAKE 1 CAPSULE (30 MG TOTAL) BY MOUTH DAILY., Disp: 90 capsule, Rfl: 2 .  levothyroxine (SYNTHROID, LEVOTHROID) 100 MCG tablet, TAKE 1 TABLET (100 MCG TOTAL) BY MOUTH DAILY., Disp: 90 tablet, Rfl: 3 .  lisinopril (ZESTRIL) 20 MG tablet, TAKE 1 TABLET EVERY DAY, Disp: 90 tablet,  Rfl: 2 .  metFORMIN (GLUCOPHAGE-XR) 500 MG 24 hr tablet, TAKE 2 TABLETS (1,000 MG TOTAL) EVERY EVENING, Disp: 180 tablet, Rfl: 3 .  Multiple Vitamins-Minerals (ONE-A-DAY WOMENS 50 PLUS PO), Take 1 tablet by mouth daily., Disp: , Rfl:  .  omeprazole (PRILOSEC) 20 MG capsule, Take 20 mg by mouth daily., Disp: , Rfl:  .  ONETOUCH DELICA LANCETS 19E MISC, Use as directed to check blood glucose daily, Disp: 100 each, Rfl: 5 .  polyethylene glycol powder (GLYCOLAX/MIRALAX) 17 GM/SCOOP powder, Please use one cup of miralax daily., Disp: 255 g, Rfl: 3 .  potassium chloride (KLOR-CON) 10 MEQ tablet, TAKE 1 TABLET (10 MEQ TOTAL) BY MOUTH DAILY., Disp: 90 tablet, Rfl: 2 .  SPIRIVA HANDIHALER 18 MCG inhalation capsule, Place 1 capsule (18 mcg total) into inhaler and inhale daily., Disp: 90 capsule, Rfl: 3 .  vitamin B-12 (CYANOCOBALAMIN) 1000 MCG tablet, Take 1,000 mcg by mouth daily., Disp: , Rfl:  .  vitamin C (ASCORBIC ACID) 500 MG tablet, Take 500 mg by mouth daily., Disp: , Rfl:   Review of Systems  Constitutional: Negative.   HENT: Positive for ear discharge and hearing loss. Negative for congestion, dental problem, drooling, ear pain, facial swelling, mouth sores, nosebleeds, postnasal drip, rhinorrhea, sinus pressure, sinus pain, sneezing, sore throat, tinnitus, trouble swallowing and voice change.   Eyes: Negative.   Respiratory: Negative.   Cardiovascular: Negative.   Endocrine: Negative.   Genitourinary: Negative.   Musculoskeletal: Negative.   Allergic/Immunologic: Negative.   Neurological: Negative.   Psychiatric/Behavioral: Negative.     Social History   Tobacco Use  . Smoking status: Never Smoker  . Smokeless tobacco: Never Used  Substance Use Topics  . Alcohol use: No    Alcohol/week: 0.0 standard drinks      Objective:   BP 136/76 (BP Location: Left Arm, Patient Position: Sitting, Cuff Size: Normal)   Pulse 72   Temp (!) 97.1 F (36.2 C) (Temporal)   Wt 223 lb (101.2 kg)    LMP  (LMP Unknown)   BMI 35.99 kg/m  Vitals:   06/23/19 0904  BP: 136/76  Pulse: 72  Temp: (!) 97.1 F (36.2 C)  TempSrc: Temporal  Weight: 223 lb (101.2 kg)  Body mass index is 35.99 kg/m.   Physical Exam Vitals reviewed.  Constitutional:      General: She is not in acute distress.    Appearance: Normal appearance. She is well-developed. She is not diaphoretic.  HENT:     Head: Normocephalic and atraumatic.     Right Ear: External ear normal.     Left Ear: External ear normal.  Eyes:     General: No scleral icterus.    Conjunctiva/sclera: Conjunctivae normal.     Pupils: Pupils are equal, round, and reactive to light.  Neck:     Thyroid: No thyromegaly.  Cardiovascular:     Rate and Rhythm: Normal rate and regular rhythm.     Pulses: Normal pulses.     Heart sounds: Murmur present.  Pulmonary:     Effort: Pulmonary effort is normal. No respiratory distress.     Breath sounds: Normal breath sounds. No wheezing  or rales.  Abdominal:     General: There is no distension.     Palpations: Abdomen is soft.     Tenderness: There is no abdominal tenderness. There is no rebound.  Musculoskeletal:        General: No deformity.     Cervical back: Neck supple.     Right lower leg: No edema.     Left lower leg: No edema.  Lymphadenopathy:     Cervical: No cervical adenopathy.  Skin:    General: Skin is warm and dry.     Capillary Refill: Capillary refill takes less than 2 seconds.     Findings: No rash.  Neurological:     Mental Status: She is alert and oriented to person, place, and time. Mental status is at baseline.  Psychiatric:        Mood and Affect: Mood normal.        Behavior: Behavior normal.        Thought Content: Thought content normal.      No results found for any visits on 06/23/19.     Assessment & Plan    Problem List Items Addressed This Visit      Cardiovascular and Mediastinum   Essential hypertension - Primary    Well controlled  Continue current medications Recheck metabolic panel F/u in 6 months       Relevant Orders   CMP (Comprehensive metabolic panel)   Secondary cardiomyopathy ([place])    Noted in patient's chart, likely 2/2 HTN BP control important      Relevant Orders   CMP (Comprehensive metabolic panel)     Respiratory   Chronic obstructive pulmonary disease (HCC)    Chronic and stable Continue Spiriva        Endocrine   T2DM (type 2 diabetes mellitus) ([place])    Associated with hypertension, hyperlipidemia Previously well controlled No hypoglycemic events On statin and ACE inhibitor Continue Metformin XR 1000 mg daily Recheck A1c Up-to-date on foot exam and vaccinations Encouraged her to schedule follow-up eye exam Follow-up in 6 months       Relevant Orders   Hemoglobin A1c   Hyperlipidemia associated with type 2 diabetes mellitus ([place])    Recently started on atorvastatin Tolerating well Continue atorvastatin 10 mg daily Recheck FLP and CMP      Relevant Orders   CMP (Comprehensive metabolic panel)   Lipid panel   Hypothyroidism    Previously well controlled Continue Synthroid at current dose  Recheck TSH and adjust Synthroid as indicated        Relevant Orders   TSH     Genitourinary   CKD (chronic kidney disease), stage II    Recheck metabolic panel Needs good control of hypertension and diabetes and avoidance of nephrotoxic medications      Relevant Orders   CMP (Comprehensive metabolic panel)   Vit D  25 hydroxy     Other   Anemia of chronic disease    Status post work-up with hematology Continue to recheck B12 and CBC twice annually      Relevant Orders   CBC w/Diff/Platelet   B12    Other Visit Diagnoses    Avitaminosis D       Relevant Orders   Vit D  25 hydroxy   Encounter for screening mammogram for malignant neoplasm of breast       Relevant Orders   MM 3D SCREEN BREAST BILATERAL   Other dietary vitamin B12 deficiency anemia  Relevant Orders   B12       Return in about 6 months (around 12/21/2019) for chronic disease f/u.   The entirety of the information documented in the History of Present Illness, Review of Systems and Physical Exam were personally obtained by me. Portions of this information were initially documented by Tiburcio Pea, CMA and reviewed by me for thoroughness and accuracy.    Alisyn Lequire, Dionne Bucy, MD MPH [facility]

## 2019-06-23 NOTE — Assessment & Plan Note (Signed)
Previously well controlled Continue Synthroid at current dose  Recheck TSH and adjust Synthroid as indicated   

## 2019-06-24 LAB — COMPREHENSIVE METABOLIC PANEL
ALT: 12 IU/L (ref 0–32)
AST: 11 IU/L (ref 0–40)
Albumin/Globulin Ratio: 1.5 (ref 1.2–2.2)
Albumin: 4.2 g/dL (ref 3.8–4.8)
Alkaline Phosphatase: 151 IU/L — ABNORMAL HIGH (ref 39–117)
BUN/Creatinine Ratio: 21 (ref 12–28)
BUN: 20 mg/dL (ref 8–27)
Bilirubin Total: 0.3 mg/dL (ref 0.0–1.2)
CO2: 24 mmol/L (ref 20–29)
Calcium: 10.1 mg/dL (ref 8.7–10.3)
Chloride: 94 mmol/L — ABNORMAL LOW (ref 96–106)
Creatinine, Ser: 0.97 mg/dL (ref 0.57–1.00)
GFR calc Af Amer: 72 mL/min/{1.73_m2} (ref >59)
GFR calc non Af Amer: 63 mL/min/{1.73_m2} (ref >59)
Globulin, Total: 2.8 g/dL (ref 1.5–4.5)
Glucose: 163 mg/dL — ABNORMAL HIGH (ref 65–99)
Potassium: 4.5 mmol/L (ref 3.5–5.2)
Sodium: 133 mmol/L — ABNORMAL LOW (ref 134–144)
Total Protein: 7 g/dL (ref 6.0–8.5)

## 2019-06-24 LAB — LIPID PANEL
Chol/HDL Ratio: 2.7 ratio (ref 0.0–4.4)
Cholesterol, Total: 172 mg/dL (ref 100–199)
HDL: 64 mg/dL (ref >39)
LDL Chol Calc (NIH): 91 mg/dL (ref 0–99)
Triglycerides: 95 mg/dL (ref 0–149)
VLDL Cholesterol Cal: 17 mg/dL (ref 5–40)

## 2019-06-24 LAB — CBC WITH DIFFERENTIAL/PLATELET
Basophils Absolute: 0 10*3/uL (ref 0.0–0.2)
Basos: 0 %
EOS (ABSOLUTE): 0.1 10*3/uL (ref 0.0–0.4)
Eos: 1 %
Hematocrit: 30.8 % — ABNORMAL LOW (ref 34.0–46.6)
Hemoglobin: 10.5 g/dL — ABNORMAL LOW (ref 11.1–15.9)
Immature Grans (Abs): 0 10*3/uL (ref 0.0–0.1)
Immature Granulocytes: 0 %
Lymphocytes Absolute: 1.2 10*3/uL (ref 0.7–3.1)
Lymphs: 19 %
MCH: 29.8 pg (ref 26.6–33.0)
MCHC: 34.1 g/dL (ref 31.5–35.7)
MCV: 88 fL (ref 79–97)
Monocytes Absolute: 0.5 10*3/uL (ref 0.1–0.9)
Monocytes: 8 %
Neutrophils Absolute: 4.7 10*3/uL (ref 1.4–7.0)
Neutrophils: 72 %
Platelets: 316 10*3/uL (ref 150–450)
RBC: 3.52 x10E6/uL — ABNORMAL LOW (ref 3.77–5.28)
RDW: 11.7 % (ref 11.7–15.4)
WBC: 6.5 10*3/uL (ref 3.4–10.8)

## 2019-06-24 LAB — VITAMIN B12: Vitamin B-12: 1535 pg/mL — ABNORMAL HIGH (ref 232–1245)

## 2019-06-24 LAB — TSH: TSH: 1.29 u[IU]/mL (ref 0.450–4.500)

## 2019-06-24 LAB — HEMOGLOBIN A1C
Est. average glucose Bld gHb Est-mCnc: 166 mg/dL
Hgb A1c MFr Bld: 7.4 % — ABNORMAL HIGH (ref 4.8–5.6)

## 2019-06-24 LAB — VITAMIN D 25 HYDROXY (VIT D DEFICIENCY, FRACTURES): Vit D, 25-Hydroxy: 54.6 ng/mL (ref 30.0–100.0)

## 2019-06-26 ENCOUNTER — Telehealth: Payer: Self-pay

## 2019-06-26 NOTE — Telephone Encounter (Signed)
Patient has review results on mychart. Patient will try to start a healthy life style. Patient will call if she is ready to start new medications.

## 2019-06-26 NOTE — Telephone Encounter (Signed)
-----   Message from Erasmo Downer, MD sent at 06/26/2019  9:23 AM EST ----- Normal kidney function.  Sodium is slightly better than last time it was checked and around baseline.  Liver enzyme, alkaline phosphatase, continues to increase.  She needs to follow-up with Dr. Maximino Greenland with [facility].  Hemoglobin/anemia, slightly worse than last time it was checked, but still around her baseline.  We will continue to monitor this every 6 months.  Her A1c has increased and is now at 7.4.  This means that her diabetes is not well controlled.  I would recommend continuing Metformin and starting a once weekly injection, like Trulicity.  If patient would like, we could have her in the office for a visit where we could demonstrate the first dose of this with one of our samples, so she could see how to inject it.  Her cholesterol is also not to goal in the setting of her diabetes.  We want her LDL, the bad cholesterol, to be less than 70.  Increase atorvastatin to 20 mg daily.  Okay to send new prescription for this new dose.

## 2019-07-08 ENCOUNTER — Other Ambulatory Visit: Payer: Self-pay | Admitting: Family Medicine

## 2019-07-28 ENCOUNTER — Other Ambulatory Visit: Payer: Self-pay

## 2019-07-28 ENCOUNTER — Encounter: Payer: Self-pay | Admitting: Podiatry

## 2019-07-28 ENCOUNTER — Ambulatory Visit: Payer: Medicare Other | Admitting: Podiatry

## 2019-07-28 ENCOUNTER — Ambulatory Visit (INDEPENDENT_AMBULATORY_CARE_PROVIDER_SITE_OTHER): Payer: Medicare Other | Admitting: Podiatry

## 2019-07-28 DIAGNOSIS — B351 Tinea unguium: Secondary | ICD-10-CM

## 2019-07-28 DIAGNOSIS — E119 Type 2 diabetes mellitus without complications: Secondary | ICD-10-CM

## 2019-07-28 DIAGNOSIS — M79676 Pain in unspecified toe(s): Secondary | ICD-10-CM

## 2019-07-28 NOTE — Progress Notes (Signed)
Complaint:  Visit Type: Patient returns to my office for continued preventative foot care services. Complaint: Patient states" my nails have grown long and thick and become painful to walk and wear shoes" Patient has been diagnosed with DM with no foot complications. The patient presents for preventative foot care services. No changes to ROS.    Podiatric Exam: Vascular: dorsalis pedis and posterior tibial pulses are palpable bilateral. Capillary return is immediate. Temperature gradient is WNL. Skin turgor WNL  Sensorium: Normal Semmes Weinstein monofilament test. Normal tactile sensation bilaterally. Nail Exam: Pt has elongated ingrown toenails  B/L.Marland Kitchen  Thick disfigured hallux nails  B/l. Ulcer Exam: There is no evidence of ulcer or pre-ulcerative changes or infection. Orthopedic Exam: Muscle tone and strength are WNL. No limitations in general ROM. No crepitus or effusions noted. Foot type and digits show no abnormalities. Bony prominences are unremarkable.  Skin: No Porokeratosis. No infection or ulcers.   Diagnosis:  Onychomycosis  Hallux  B/L, Pain in right toe, pain in left toes .  Treatment & Plan Procedures and Treatment: Consent by patient was obtained for treatment procedures.   Debridement of mycotic and hypertrophic toenails, 1 through 5 bilateral and clearing of subungual debris. No ulceration, no infection noted.  Return Visit-Office Procedure: Patient instructed to return to the office for a follow up visit 3 months   for continued evaluation and treatment.    Helane Gunther DPM

## 2019-08-01 DIAGNOSIS — M778 Other enthesopathies, not elsewhere classified: Secondary | ICD-10-CM | POA: Diagnosis not present

## 2019-08-21 ENCOUNTER — Other Ambulatory Visit: Payer: Self-pay | Admitting: Family Medicine

## 2019-09-01 ENCOUNTER — Other Ambulatory Visit: Payer: Self-pay | Admitting: Family Medicine

## 2019-09-01 NOTE — Telephone Encounter (Signed)
[facility] faxed refill request for the following medications:   levothyroxine (SYNTHROID, LEVOTHROID) 100 MCG tablet   Please advise.  Thanks, [employer]

## 2019-09-02 MED ORDER — LEVOTHYROXINE SODIUM 100 MCG PO TABS: 100.0000 ug | ORAL_TABLET | Freq: Every day | ORAL | 3 refills | Status: DC

## 2019-09-08 ENCOUNTER — Telehealth: Payer: Self-pay | Admitting: Family Medicine

## 2019-09-08 ENCOUNTER — Other Ambulatory Visit: Payer: Self-pay | Admitting: Family Medicine

## 2019-09-08 NOTE — Telephone Encounter (Signed)
RX REFILL Medication atorvastatin (LIPITOR) 10 MG tablet PHARMACY [facility] - [place], [place] - [address] Phone:  [phone]  Fax:  [phone]      Patient would like PCP nurse to call back regarding how to take medication. Call back [phone]

## 2019-09-09 MED ORDER — ATORVASTATIN CALCIUM 20 MG PO TABS: 20.0000 mg | ORAL_TABLET | Freq: Every day | ORAL | 1 refills | Status: DC

## 2019-09-09 NOTE — Telephone Encounter (Signed)
Yes. Ok to send Atorvastatin 20mg  pill, take 1 pill daily #90 r3

## 2019-09-09 NOTE — Telephone Encounter (Signed)
Spoke with pt's sister June Watson (On DPR) Pt is taking Atorvastatin 20mg  since increasing 06/26/2019.  Is it okay to send the 20mg  tablets to [facility]?  Thanks,   - 

## 2019-09-15 ENCOUNTER — Other Ambulatory Visit: Payer: Self-pay

## 2019-09-15 ENCOUNTER — Encounter: Payer: Self-pay | Admitting: *Deleted

## 2019-09-15 ENCOUNTER — Ambulatory Visit (INDEPENDENT_AMBULATORY_CARE_PROVIDER_SITE_OTHER): Payer: Medicare Other

## 2019-09-15 ENCOUNTER — Encounter: Payer: Self-pay | Admitting: Podiatry

## 2019-09-15 ENCOUNTER — Ambulatory Visit (INDEPENDENT_AMBULATORY_CARE_PROVIDER_SITE_OTHER): Payer: Medicare Other | Admitting: Podiatry

## 2019-09-15 VITALS — Temp 98.1°F

## 2019-09-15 DIAGNOSIS — S99822A Other specified injuries of left foot, initial encounter: Secondary | ICD-10-CM

## 2019-09-15 DIAGNOSIS — M898X9 Other specified disorders of bone, unspecified site: Secondary | ICD-10-CM | POA: Diagnosis not present

## 2019-09-15 DIAGNOSIS — S99922A Unspecified injury of left foot, initial encounter: Secondary | ICD-10-CM | POA: Diagnosis not present

## 2019-09-15 NOTE — Progress Notes (Signed)
Complaint:  Visit Type: Patient returns to my office with chief complaint of pain and swelling on the top of her left foot.  She says this is aggravated with activity.  She says she has no history of trauma or injury to her left foot.  She says that she has difficulty taking anti-inflammatory medicine and thus requests an injection if needed.  This patient is diabetic.  She presents the office today for an evaluation and treatment of her painful left foot.  Podiatric Exam: Vascular: dorsalis pedis and posterior tibial pulses are palpable bilateral. Capillary return is immediate. Temperature gradient is WNL. Skin turgor WNL  Sensorium: Normal Semmes Weinstein monofilament test. Normal tactile sensation bilaterally. Nail Exam: Pt has elongated ingrown toenails  B/L.Marland Kitchen  Thick disfigured hallux nails  B/l. Ulcer Exam: There is no evidence of ulcer or pre-ulcerative changes or infection. Orthopedic Exam: Muscle tone and strength are WNL. No limitations in general ROM. No crepitus or effusions noted. Foot type and digits show no abnormalities. Bony prominences are unremarkable.  Palpable pain noted to the fourth and fifth metatarsal shafts of the left foot.  Mild swelling noted on the dorsum of the fourth and fifth metatarsals left foot.   Skin: No Porokeratosis. No infection or ulcers.   Diagnosis:  Bone pain left foot  Possible stress fracture.  Treatment & Plan Procedures and Treatment: Consent by patient was obtained for treatment procedures.  X-rays revealed no evidence of any displaced fracture or even stress fracture of the fourth and fifth metatarsal left foot.  There is a cystic area on the medial aspect of the fourth metatarsal left foot. There is no palpable pain at the cystic area fourth metatarsal clinically.  I proceeded to dispense a Cam walker for this patient to ambulate with for the next 2 weeks.  She is to return to the office in 2 weeks for further evaluation and treatment. Return  Visit-Office Procedure: Patient instructed to return to the office for a follow up visit 2 weeks   for continued evaluation and treatment of left foot pain.      Helane Gunther DPM

## 2019-09-17 ENCOUNTER — Telehealth: Payer: Self-pay | Admitting: *Deleted

## 2019-09-17 DIAGNOSIS — Z23 Encounter for immunization: Secondary | ICD-10-CM | POA: Diagnosis not present

## 2019-09-17 NOTE — Telephone Encounter (Signed)
"  THIS IS Emma Fletcher CALL ME."

## 2019-09-17 NOTE — Telephone Encounter (Signed)
I returned call to patient, she stated that she thought she had left her medicaid card at the office, but had found it.  She did not need anything else.

## 2019-09-29 ENCOUNTER — Encounter: Payer: Self-pay | Admitting: Podiatry

## 2019-09-29 ENCOUNTER — Other Ambulatory Visit: Payer: Self-pay

## 2019-09-29 ENCOUNTER — Ambulatory Visit (INDEPENDENT_AMBULATORY_CARE_PROVIDER_SITE_OTHER): Payer: Medicare Other | Admitting: Podiatry

## 2019-09-29 DIAGNOSIS — M898X9 Other specified disorders of bone, unspecified site: Secondary | ICD-10-CM

## 2019-09-29 DIAGNOSIS — S99922A Unspecified injury of left foot, initial encounter: Secondary | ICD-10-CM

## 2019-09-29 DIAGNOSIS — E119 Type 2 diabetes mellitus without complications: Secondary | ICD-10-CM

## 2019-09-29 NOTE — Progress Notes (Signed)
This patient presents to the office stating that her left foot is doing better.  She says that her foot is 35% improved from 2 weeks ago.  She presents the office not wearing her cam walker but her regular shoes.  She says she has been working without her cam walker.  She says she wore the cam walker for only 2 to 3 days before she stopped wearing cam walker.  Examining her foot reveals continued swelling and pain over the outside top of her left foot.  She presents the office today for continued evaluation and treatment of her painful left foot.  She presents to the office without her brother.    Vascular  Dorsalis pedis and posterior tibial pulses are palpable  B/L.  Capillary return  WNL.  Temperature gradient is  WNL.  Skin turgor  WNL  Sensorium  Senn Weinstein monofilament wire  WNL. Normal tactile sensation.  Nail Exam  Patient has normal nails with no evidence of bacterial or fungal infection.  Orthopedic  Exam  Muscle tone and muscle strength  WNL.  No limitations of motion feet  B/L.  No crepitus or joint effusion noted.  Foot type is unremarkable and digits show no abnormalities.  Bony prominences are unremarkable.Palpable pain persists over the fourth metatarsal and fifth metatarsal left foot.  2-3+ swelling dorsum of left foot.    Skin  No open lesions.  Normal skin texture and turgor.  Bone pain left foot.  Possible stress fracture.  ROV.  Prior to examining her foot I reviewed her x-rays.  There is no conclusive fracture to the fourth and fifth metatarsals left foot.  My examination reveals persistent pain and swelling and I am questioning her 35% improvement.  Due to the fact that she could not wear her cam walker a surgical shoe was dispensed for her to wear.  If this patient has a stress fracture developing I felt a cam walker would be best but a surgical shoe would be necessary now.  She is to wear her surgical shoe and to return to the office in 2 weeks for further evaluation and  treatment.  When she returns to the office in 2 weeks and x-rays will be taken to check on the possibility of a stress fracture.   Helane Gunther DPM

## 2019-10-02 ENCOUNTER — Telehealth: Payer: Self-pay | Admitting: Family Medicine

## 2019-10-02 NOTE — Telephone Encounter (Signed)
Emma Fletcher is calling from [employer] is calling to see if fax was received- regarding a script for a cardaic stress test. Please advise [phone]

## 2019-10-03 NOTE — Telephone Encounter (Signed)
Spoke with pt's caregiver ([name])  Pt is not interested in this.  Left detailed message advising Lollie Sails from [employer].   Thanks,   -Vernona Rieger

## 2019-10-10 ENCOUNTER — Other Ambulatory Visit: Payer: Self-pay | Admitting: Family Medicine

## 2019-10-10 ENCOUNTER — Telehealth: Payer: Self-pay | Admitting: Podiatry

## 2019-10-10 NOTE — Telephone Encounter (Signed)
Ms. Claudette Laws called in the reschedule appoint for the patient . Ms. Claudette Laws would like to move the appointment date up 3-4 days so that the patient can return to work sooner. Ms. Claudette Laws would like to get  The medical  recommendations

## 2019-10-13 ENCOUNTER — Ambulatory Visit (INDEPENDENT_AMBULATORY_CARE_PROVIDER_SITE_OTHER): Payer: Medicare Other | Admitting: Podiatry

## 2019-10-13 ENCOUNTER — Encounter: Payer: Self-pay | Admitting: Podiatry

## 2019-10-13 ENCOUNTER — Encounter: Payer: Self-pay | Admitting: *Deleted

## 2019-10-13 ENCOUNTER — Other Ambulatory Visit: Payer: Self-pay

## 2019-10-13 DIAGNOSIS — M898X9 Other specified disorders of bone, unspecified site: Secondary | ICD-10-CM

## 2019-10-13 DIAGNOSIS — S99922A Unspecified injury of left foot, initial encounter: Secondary | ICD-10-CM | POA: Diagnosis not present

## 2019-10-13 DIAGNOSIS — E119 Type 2 diabetes mellitus without complications: Secondary | ICD-10-CM

## 2019-10-13 NOTE — Progress Notes (Signed)
This patient presents to the office stating that her left foot is doing better.  She says that her foot is causing her no pain or discomfort today.  She says she is has no swelling of the left foot since her last visit.  She has been ambulating with the wooden shoe that was dispensed at her last visit.  She is very pleased with her progress and feels that her left foot pain has resolved.  Vascular  Dorsalis pedis and posterior tibial pulses are palpable  B/L.  Capillary return  WNL.  Temperature gradient is  WNL.  Skin turgor  WNL  Sensorium  Senn Weinstein monofilament wire  WNL. Normal tactile sensation.  Nail Exam  Patient has normal nails with no evidence of bacterial or fungal infection.  Orthopedic  Exam  Muscle tone and muscle strength  WNL.  No limitations of motion feet  B/L.  No crepitus or joint effusion noted.  Foot type is unremarkable and digits show no abnormalities.  No evidence of palpable pain over the fourth fifth metatarsal left foot.  No evidence of swelling over the fourth and fifth metatarsal left foot.  No increased temperature over the fourth and fifth metatarsal left foot.  Skin  No open lesions.  Normal skin texture and turgor.  S/P foot sprain left foot   ROV.  Patient states that her left foot has caused her no pain and discomfort for days.  She has been faithfully wearing her surgical shoe that was dispensed.  She believes she can now return to her regular shoes and not experience pain and discomfort.  Therefore I told her she could return for her regular shoes and even return to work and knows shoes but if there is any pain or discomfort outside of work she needs to return to her wooden surgical shoe.  Patient is scheduled for nail care in 2 weeks and patient will be evaluated for her left foot pain at that time.  He did say 2 weeks for for renewal for coming in for her nails   Helane Gunther DPM

## 2019-10-15 DIAGNOSIS — Z23 Encounter for immunization: Secondary | ICD-10-CM | POA: Diagnosis not present

## 2019-10-16 ENCOUNTER — Ambulatory Visit: Payer: Medicare Other | Admitting: Podiatry

## 2019-10-22 DIAGNOSIS — Z8249 Family history of ischemic heart disease and other diseases of the circulatory system: Secondary | ICD-10-CM | POA: Diagnosis not present

## 2019-10-27 ENCOUNTER — Ambulatory Visit (INDEPENDENT_AMBULATORY_CARE_PROVIDER_SITE_OTHER): Payer: Medicare Other | Admitting: Podiatry

## 2019-10-27 ENCOUNTER — Other Ambulatory Visit: Payer: Self-pay

## 2019-10-27 ENCOUNTER — Encounter: Payer: Self-pay | Admitting: Podiatry

## 2019-10-27 VITALS — Temp 97.2°F

## 2019-10-27 DIAGNOSIS — E119 Type 2 diabetes mellitus without complications: Secondary | ICD-10-CM | POA: Diagnosis not present

## 2019-10-27 DIAGNOSIS — N182 Chronic kidney disease, stage 2 (mild): Secondary | ICD-10-CM | POA: Diagnosis not present

## 2019-10-27 DIAGNOSIS — B351 Tinea unguium: Secondary | ICD-10-CM

## 2019-10-27 DIAGNOSIS — M79676 Pain in unspecified toe(s): Secondary | ICD-10-CM

## 2019-10-27 DIAGNOSIS — L600 Ingrowing nail: Secondary | ICD-10-CM

## 2019-10-27 NOTE — Progress Notes (Signed)
This patient returns to my office for at risk foot care.  This patient requires this care by a professional since this patient will be at risk due to having diabetes and chronic kidney disease.  This patient is unable to cut nails herself since the patient cannot reach her nails.These nails are painful walking and wearing shoes. Her left foot is pain free. This patient presents for at risk foot care today.  General Appearance  Alert, conversant and in no acute stress.  Vascular  Dorsalis pedis and posterior tibial  pulses are palpable  bilaterally.  Capillary return is within normal limits  bilaterally. Temperature is within normal limits  bilaterally.  Neurologic  Senn-Weinstein monofilament wire test within normal limits  bilaterally. Muscle power within normal limits bilaterally.  Nails Thick disfigured discolored nails with subungual debris  from hallux to fifth toes bilaterally. No evidence of bacterial infection or drainage bilaterally.  Orthopedic  No limitations of motion  feet .  No crepitus or effusions noted.  No bony pathology or digital deformities noted.  Skin  normotropic skin with no porokeratosis noted bilaterally.  No signs of infections or ulcers noted.     Onychomycosis  Pain in right toes  Pain in left toes  Consent was obtained for treatment procedures.   Mechanical debridement of nails 1-5  bilaterally performed with a nail nipper.  Filed with dremel without incident.    Return office visit  3 months                    Told patient to return for periodic foot care and evaluation due to potential at risk complications.   Helane Gunther DPM

## 2019-11-11 DIAGNOSIS — R928 Other abnormal and inconclusive findings on diagnostic imaging of breast: Secondary | ICD-10-CM | POA: Diagnosis not present

## 2019-11-19 ENCOUNTER — Ambulatory Visit
Admission: RE | Admit: 2019-11-19 | Discharge: 2019-11-19 | Disposition: A | Discharge status: Home / Self Care | Payer: Medicare Other | Source: Ambulatory Visit | Attending: Family Medicine | Admitting: Family Medicine

## 2019-11-19 DIAGNOSIS — Z1231 Encounter for screening mammogram for malignant neoplasm of breast: Secondary | ICD-10-CM | POA: Diagnosis not present

## 2019-11-25 ENCOUNTER — Telehealth: Payer: Self-pay

## 2019-11-25 NOTE — Telephone Encounter (Signed)
Pt's sister June Watson advised.  (On DPR).  Thanks,   -Vernona Rieger

## 2019-11-25 NOTE — Telephone Encounter (Signed)
-----   Message from Erasmo Downer, MD sent at 11/25/2019 11:20 AM EDT ----- Normal mammogram. Repeat in 1 yr

## 2019-11-26 ENCOUNTER — Telehealth: Payer: Self-pay | Admitting: Family Medicine

## 2019-11-26 DIAGNOSIS — M79604 Pain in right leg: Secondary | ICD-10-CM | POA: Diagnosis not present

## 2019-11-26 NOTE — Telephone Encounter (Signed)
Emma Fletcher - patient's sister dropped off [employer] for patient.  The form was placed in Dr. B.'s box. Emma is requesting Dr. Leonard Schwartz. To write a letter stating patient cannot attend jury duty due to disabilities.  Please call Emma at [phone] for questions and when letter is ready for pick up.  Thanks, [employer]

## 2019-11-27 ENCOUNTER — Encounter: Payer: Self-pay | Admitting: Family Medicine

## 2019-11-27 NOTE — Telephone Encounter (Signed)
Letter printed.  Will sign and leave up front for pickup.

## 2019-11-27 NOTE — Telephone Encounter (Signed)
June advised as below.

## 2019-12-11 ENCOUNTER — Telehealth: Payer: Self-pay

## 2019-12-11 NOTE — Telephone Encounter (Signed)
Copied from CRM [id number]. Topic: General - Other >> Dec 11, 2019  1:32 PM Lyn Hollingshead D wrote: Reason for CRM: PT wants a call back from a nurse

## 2019-12-23 ENCOUNTER — Ambulatory Visit: Payer: Medicare Other | Admitting: Family Medicine

## 2019-12-24 ENCOUNTER — Ambulatory Visit: Payer: Medicare Other | Admitting: Family Medicine

## 2020-01-21 ENCOUNTER — Telehealth: Payer: Self-pay

## 2020-01-21 NOTE — Telephone Encounter (Signed)
Copied from CRM [id number]. Topic: General - Inquiry >> Jan 20, 2020  3:58 PM Floria Raveling A wrote: Reason for CRM: Duffy Bruce with [employer] called and stated she is refaxing over forms today for labs that needs to be signed off on but Dr B , which they are aware she is out of the office  Best number [phone] ext [phone]

## 2020-01-26 NOTE — Telephone Encounter (Signed)
Spoke to Morgan and asked her to please fax form again.

## 2020-01-26 NOTE — Telephone Encounter (Signed)
I have not come across any forms for this patient. May need to have them refaxed

## 2020-02-02 ENCOUNTER — Ambulatory Visit (INDEPENDENT_AMBULATORY_CARE_PROVIDER_SITE_OTHER): Payer: Medicare Other | Admitting: Podiatry

## 2020-02-02 ENCOUNTER — Encounter: Payer: Self-pay | Admitting: Podiatry

## 2020-02-02 ENCOUNTER — Other Ambulatory Visit: Payer: Self-pay

## 2020-02-02 ENCOUNTER — Ambulatory Visit: Payer: Medicare Other | Admitting: Family Medicine

## 2020-02-02 DIAGNOSIS — E119 Type 2 diabetes mellitus without complications: Secondary | ICD-10-CM | POA: Diagnosis not present

## 2020-02-02 DIAGNOSIS — R609 Edema, unspecified: Secondary | ICD-10-CM

## 2020-02-02 DIAGNOSIS — L600 Ingrowing nail: Secondary | ICD-10-CM | POA: Diagnosis not present

## 2020-02-02 DIAGNOSIS — N182 Chronic kidney disease, stage 2 (mild): Secondary | ICD-10-CM | POA: Diagnosis not present

## 2020-02-02 DIAGNOSIS — B351 Tinea unguium: Secondary | ICD-10-CM | POA: Diagnosis not present

## 2020-02-02 DIAGNOSIS — M79676 Pain in unspecified toe(s): Secondary | ICD-10-CM | POA: Diagnosis not present

## 2020-02-02 NOTE — Progress Notes (Signed)
This patient returns to my office for at risk foot care.  This patient requires this care by a professional since this patient will be at risk due to having diabetes and chronic kidney disease.  This patient is unable to cut nails herself since the patient cannot reach her nails.These nails are painful walking and wearing shoes. Her left foot is pain free. This patient presents for at risk foot care today.  Patient concernede about her ankle swelling.  General Appearance  Alert, conversant and in no acute stress.  Vascular  Dorsalis pedis and posterior tibial  pulses are palpable  bilaterally.  Capillary return is within normal limits  bilaterally. Temperature is within normal limits  bilaterally.  Neurologic  Senn-Weinstein monofilament wire test within normal limits  bilaterally. Muscle power within normal limits bilaterally.  Nails Thick disfigured discolored nails with subungual debris  from hallux to fifth toes bilaterally. No evidence of bacterial infection or drainage bilaterally.  Orthopedic  No limitations of motion  feet .  No crepitus or effusions noted.  No bony pathology or digital deformities noted.  Skin  normotropic skin with no porokeratosis noted bilaterally.  No signs of infections or ulcers noted.     Onychomycosis  Pain in right toes  Pain in left toes  Consent was obtained for treatment procedures.   Mechanical debridement of nails 1-5  bilaterally performed with a nail nipper.  Filed with dremel without incident.  Discussed swelling of her ankles with this patient.   Return office visit  3 months                    Told patient to return for periodic foot care and evaluation due to potential at risk complications.   Helane Gunther DPM

## 2020-02-10 ENCOUNTER — Other Ambulatory Visit: Payer: Self-pay | Admitting: Family Medicine

## 2020-02-13 DIAGNOSIS — R52 Pain, unspecified: Secondary | ICD-10-CM | POA: Diagnosis not present

## 2020-02-13 DIAGNOSIS — M722 Plantar fascial fibromatosis: Secondary | ICD-10-CM | POA: Diagnosis not present

## 2020-02-20 ENCOUNTER — Ambulatory Visit: Payer: Medicare Other | Admitting: Family Medicine

## 2020-02-26 ENCOUNTER — Other Ambulatory Visit: Payer: Self-pay

## 2020-02-26 ENCOUNTER — Encounter: Payer: Self-pay | Admitting: Family Medicine

## 2020-02-26 ENCOUNTER — Ambulatory Visit (INDEPENDENT_AMBULATORY_CARE_PROVIDER_SITE_OTHER): Payer: Medicare Other | Admitting: Family Medicine

## 2020-02-26 VITALS — BP 122/82 | HR 72 | Temp 98.7°F | Wt 225.0 lb

## 2020-02-26 DIAGNOSIS — M7061 Trochanteric bursitis, right hip: Secondary | ICD-10-CM | POA: Diagnosis not present

## 2020-02-26 DIAGNOSIS — M7071 Other bursitis of hip, right hip: Secondary | ICD-10-CM | POA: Insufficient documentation

## 2020-02-26 MED ORDER — MELOXICAM 15 MG PO TABS: 15.0000 mg | ORAL_TABLET | Freq: Every day | ORAL | 1 refills | Status: DC

## 2020-02-26 NOTE — Progress Notes (Signed)
I,Laura E Walsh,acting as a scribe for Lavon Paganini, MD.,have documented all relevant documentation on the behalf of Lavon Paganini, MD,as directed by  Lavon Paganini, MD while in the presence of Lavon Paganini, MD.    Established patient visit   Patient: Emma Fletcher   DOB: Mar 23, 1956   64 y.o. Female  MRN: [id number] Visit Date: 02/26/2020  Today's healthcare provider: Lavon Paganini, MD   Chief Complaint  Patient presents with  . Hip Pain    Right side started about three weeks ago.   Subjective    Hip Pain  The incident occurred more than 1 week ago (Started about three weeks ago.). There was no injury mechanism. The pain is present in the right hip. The quality of the pain is described as aching. The pain has been intermittent since onset. Pertinent negatives include no inability to bear weight, loss of motion, loss of sensation, muscle weakness, numbness or tingling. She has tried acetaminophen, ice and heat for the symptoms. The treatment provided no relief.   HPI    Hip Pain     Additional comments: Right side started about three weeks ago.       Last edited by Kizzie Furnish, CMA on 02/26/2020  2:23 PM. (History)       Describes it as a tightness, worse in mornings and after work, on R lateral hip with radiation to knee along lateral leg.    Medications: Outpatient Medications Prior to Visit  Medication Sig  . atorvastatin (LIPITOR) 20 MG tablet Take 1 tablet (20 mg total) by mouth daily.  . Blood Glucose Monitoring Suppl (ONE TOUCH ULTRA 2) w/Device KIT Use as directed to check blood sugar once daily  . Cholecalciferol (VITAMIN D3) 125 MCG (5000 UT) CAPS TAKE 1 CAPSULE EVERY DAY  . Choline Fenofibrate (FENOFIBRIC ACID) 135 MG CPDR Take 135 mg by mouth daily.   . Ferrous Sulfate (IRON) 325 (65 Fe) MG TABS Take 2 tablets by mouth daily.  . fluticasone (FLONASE) 50 MCG/ACT nasal spray Place 2 sprays into both nostrils daily.  . furosemide  (LASIX) 20 MG tablet TAKE 1 TABLET EVERY DAY  . glucose blood (ONE TOUCH ULTRA TEST) test strip Use as instructed to check blood glucose daily  . lansoprazole (PREVACID) 30 MG capsule TAKE 1 CAPSULE (30 MG TOTAL) BY MOUTH DAILY.  Marland Kitchen levothyroxine (SYNTHROID) 100 MCG tablet Take 1 tablet (100 mcg total) by mouth daily.  Marland Kitchen lisinopril (ZESTRIL) 20 MG tablet TAKE 1 TABLET EVERY DAY  . metFORMIN (GLUCOPHAGE-XR) 500 MG 24 hr tablet TAKE 2 TABLETS (1,000 MG TOTAL) EVERY EVENING  . Multiple Vitamins-Minerals (ONE-A-DAY WOMENS 50 PLUS PO) Take 1 tablet by mouth daily.  Glory Rosebush DELICA LANCETS 52W MISC Use as directed to check blood glucose daily  . polyethylene glycol powder (GLYCOLAX/MIRALAX) 17 GM/SCOOP powder Please use one cup of miralax daily.  . potassium chloride (KLOR-CON) 10 MEQ tablet TAKE 1 TABLET (10 MEQ TOTAL) BY MOUTH DAILY.  Marland Kitchen SPIRIVA HANDIHALER 18 MCG inhalation capsule INHALE THE CONTENTS OF 1 CAPSULE EVERY DAY  VIA  HANDIHALER  . SYMBICORT 160-4.5 MCG/ACT inhaler INHALE 2 PUFFS TWICE DAILY  . vitamin B-12 (CYANOCOBALAMIN) 1000 MCG tablet Take 1,000 mcg by mouth daily.  . vitamin C (ASCORBIC ACID) 500 MG tablet Take 500 mg by mouth daily.   No facility-administered medications prior to visit.    Review of Systems  Constitutional: Negative.   Respiratory: Negative.   Cardiovascular: Negative.  Negative for  leg swelling.  Gastrointestinal: Negative.   Musculoskeletal: Positive for arthralgias and back pain. Negative for gait problem, joint swelling, myalgias, neck pain and neck stiffness.  Neurological: Negative for dizziness, tingling, weakness, light-headedness, numbness and headaches.      Objective    BP 122/82 (BP Location: Left Arm, Patient Position: Sitting, Cuff Size: Large)   Pulse 72   Temp 98.7 F (37.1 C) (Oral)   Wt 225 lb (102.1 kg)   LMP  (LMP Unknown)   SpO2 99%   BMI 36.32 kg/m    Physical Exam Constitutional:      General: She is not in acute  distress.    Appearance: Normal appearance. She is not diaphoretic.  Cardiovascular:     Rate and Rhythm: Normal rate and regular rhythm.  Pulmonary:     Effort: Pulmonary effort is normal. No respiratory distress.  Musculoskeletal:     Right upper leg: Tenderness (over R greater trochanteric bursa and along IT band) present. No swelling, edema or deformity.     Left upper leg: Normal.     Right lower leg: No deformity or bony tenderness. No edema.     Left lower leg: Normal. No edema.  Skin:    General: Skin is warm and dry.  Neurological:     Mental Status: She is alert and oriented to person, place, and time. Mental status is at baseline.     Sensory: No sensory deficit.     Motor: No weakness.     Gait: Gait abnormal (antalgic).  Psychiatric:        Mood and Affect: Mood normal.        Behavior: Behavior normal.       No results found for any visits on 02/26/20.  Assessment & Plan     1. Trochanteric bursitis of right hip - new problem - exam is consistent with trochanteric bursitis of the right hip -No evidence of radiculopathy/sciatica or hip joint pain on exam or history -Discussed need for conservative therapy initially -Start home exercise program-handout given -Meloxicam daily for the next 2 weeks and then as needed -Avoid other NSAIDs -Can use Tylenol as needed -Ice area frequently, especially after work -If pain persists despite conservative therapy, consider corticosteroid injection at that time  Return if symptoms worsen or fail to improve.      I, Lavon Paganini, MD, have reviewed all documentation for this visit. The documentation on 02/27/20 for the exam, diagnosis, procedures, and orders are all accurate and complete.   Alannie Amodio, Dionne Bucy, MD, MPH [facility]

## 2020-02-26 NOTE — Patient Instructions (Signed)
Hip Bursitis  Hip bursitis is swelling of a fluid-filled sac (bursa) in your hip joint. This swelling (inflammation) can be painful. This condition may come and go over time. What are the causes?  Injury to the hip.  Overuse of the muscles that surround the hip joint.  An earlier injury or surgery of the hip.  Arthritis or gout.  Diabetes.  Thyroid disease.  Infection.  In some cases, the cause may not be known. What are the signs or symptoms?  Mild or moderate pain in the hip area. Pain may get worse with movement.  Tenderness and swelling of the hip, especially on the outer side of the hip.  In rare cases, the bursa may become infected. This may cause: ? A fever. ? Warmth and redness in the area. Symptoms may come and go. How is this treated? This condition is treated by resting, icing, applying pressure (compression), and raising (elevating) the injured area. You may hear this called the RICE treatment. Treatment may also include:  Using crutches.  Draining fluid out of the bursa to help relieve swelling.  Giving a shot of (injecting) medicine that helps to reduce swelling (cortisone).  Other medicines if the bursa is infected. Follow these instructions at home: Managing pain, stiffness, and swelling   If told, put ice on the painful area. ? Put ice in a plastic bag. ? Place a towel between your skin and the bag. ? Leave the ice on for 20 minutes, 2-3 times a day. ? Raise (elevate) your hip above the level of your heart as much as you can without pain. To do this, try putting a pillow under your hips while you lie down. Stop if this causes pain. Activity  Return to your normal activities as told by your doctor. Ask your doctor what activities are safe for you.  Rest and protect your hip as much as you can until you feel better. General instructions  Take over-the-counter and prescription medicines only as told by your doctor.  Wear wraps that put pressure  on your hip (compression wraps) only as told by your doctor.  Do not use your hip to support your body weight until your doctor says that you can.  Use crutches as told by your doctor.  Gently rub and stretch your injured area as often as is comfortable.  Keep all follow-up visits as told by your doctor. This is important. How is this prevented?  Exercise regularly, as told by your doctor.  Warm up and stretch before being active.  Cool down and stretch after being active.  Avoid activities that bother your hip or cause pain.  Avoid sitting down for long periods at a time. Contact a doctor if:  You have a fever.  You get new symptoms.  You have trouble walking.  You have trouble doing everyday activities.  You have pain that gets worse.  You have pain that does not get better with medicine.  You get red skin on your hip area.  You get a feeling of warmth in your hip area. Get help right away if:  You cannot move your hip.  You have very bad pain. Summary  Hip bursitis is swelling of a fluid-filled sac (bursa) in your hip.  Hip bursitis can be painful.  Symptoms often come and go over time.  This condition is treated with rest, ice, compression, elevation, and medicines. This information is not intended to replace advice given to you by your health care provider.   Make sure you discuss any questions you have with your health care provider. Document Revised: 02/11/2018 Document Reviewed: 02/11/2018 Elsevier Patient Education  2020 [employer]  

## 2020-02-27 ENCOUNTER — Encounter: Payer: Self-pay | Admitting: Family Medicine

## 2020-03-09 ENCOUNTER — Other Ambulatory Visit: Payer: Self-pay | Admitting: Family Medicine

## 2020-03-09 DIAGNOSIS — Z85038 Personal history of other malignant neoplasm of large intestine: Secondary | ICD-10-CM | POA: Diagnosis not present

## 2020-03-09 DIAGNOSIS — Z801 Family history of malignant neoplasm of trachea, bronchus and lung: Secondary | ICD-10-CM | POA: Diagnosis not present

## 2020-03-09 DIAGNOSIS — Z1379 Encounter for other screening for genetic and chromosomal anomalies: Secondary | ICD-10-CM | POA: Diagnosis not present

## 2020-03-09 NOTE — Telephone Encounter (Signed)
Requested Prescriptions  Pending Prescriptions Disp Refills   atorvastatin (LIPITOR) 20 MG tablet [Pharmacy Med Name: ATORVASTATIN CALCIUM 20 MG Tablet] 90 tablet 1    Sig: TAKE 1 TABLET EVERY DAY     Cardiovascular:  Antilipid - Statins Failed - 03/09/2020  5:59 PM      Failed - LDL in normal range and within 360 days    LDL Chol Calc (NIH)  Date Value Ref Range Status  06/23/2019 91 0 - 99 mg/dL Final         Passed - Total Cholesterol in normal range and within 360 days    Cholesterol, Total  Date Value Ref Range Status  06/23/2019 172 100 - 199 mg/dL Final         Passed - HDL in normal range and within 360 days    HDL  Date Value Ref Range Status  06/23/2019 64 >39 mg/dL Final         Passed - Triglycerides in normal range and within 360 days    Triglycerides  Date Value Ref Range Status  06/23/2019 95 0 - 149 mg/dL Final         Passed - Patient is not pregnant      Passed - Valid encounter within last 12 months    Recent Outpatient Visits          1 week ago Trochanteric bursitis of right hip   [facility] [place], Dionne Bucy, MD   8 months ago Essential hypertension   [facility] [place], Dionne Bucy, MD   1 year ago Right lower quadrant abdominal pain   [facility] [place], Dionne Bucy, MD   1 year ago Essential hypertension   [place], Dionne Bucy, MD   1 year ago Acute non-recurrent pansinusitis   [place], Dionne Bucy, MD      Future Appointments            In 3 weeks Bacigal[place], Dionne Bucy, MD [facility]            potassium chloride (KLOR-CON) 10 MEQ tablet [Pharmacy Med Name: POTASSIUM CHLORIDE ER 10 MEQ Tablet Extended Release] 90 tablet 2    Sig: TAKE 1 TABLET EVERY DAY     Endocrinology:  Minerals - Potassium Supplementation Passed - 03/09/2020  5:59 PM      Passed - K in normal range and within 360 days    Potassium   Date Value Ref Range Status  06/23/2019 4.5 3.5 - 5.2 mmol/L Final         Passed - Cr in normal range and within 360 days    Creatinine, Ser  Date Value Ref Range Status  06/23/2019 0.97 0.57 - 1.00 mg/dL Final         Passed - Valid encounter within last 12 months    Recent Outpatient Visits          1 week ago Trochanteric bursitis of right hip   [facility] [place], Dionne Bucy, MD   8 months ago Essential hypertension   [place], Dionne Bucy, MD   1 year ago Right lower quadrant abdominal pain   [facility] [place], Dionne Bucy, MD   1 year ago Essential hypertension   [place], Dionne Bucy, MD   1 year ago Acute non-recurrent pansinusitis   [facility] [place], Dionne Bucy, MD      Future Appointments  In 3 weeks Bacigalupo, Dionne Bucy, MD [facility]            Cholecalciferol (VITAMIN D3) 125 MCG (5000 UT) CAPS [Pharmacy Med Name: VITAMIN D3 125 MCG (5000 UT) Capsule] 90 capsule 3    Sig: TAKE [place] DAY     Endocrinology:  Vitamins - Vitamin D Supplementation Failed - 03/09/2020  5:59 PM      Failed - 50,000 IU strengths are not delegated      Failed - Phosphate in normal range and within 360 days    No results found for: PHOS       Passed - Ca in normal range and within 360 days    Calcium  Date Value Ref Range Status  06/23/2019 10.1 8.7 - 10.3 mg/dL Final         Passed - Vitamin D in normal range and within 360 days    Vit D, 25-Hydroxy  Date Value Ref Range Status  06/23/2019 54.6 30.0 - 100.0 ng/mL Final    Comment:    Vitamin D deficiency has been defined by the Institute of Medicine and an Endocrine Society practice guideline as a level of serum 25-OH vitamin D less than 20 ng/mL (1,2). The Endocrine Society went on to further define vitamin D insufficiency as a level between 21 and 29 ng/mL (2). 1. IOM  (Institute of Medicine). 2010. Dietary reference    intakes for calcium and D. [place]: The    [employer]. 2. Holick MF, [place], Bischoff-Ferrari HA, et al.    Evaluation, treatment, and prevention of vitamin D    deficiency: an Endocrine Society clinical practice    guideline. JCEM. 2011 Jul; 96(7):1911-30.          Passed - Valid encounter within last 12 months    Recent Outpatient Visits          1 week ago Trochanteric bursitis of right hip   [facility] [place], Dionne Bucy, MD   8 months ago Essential hypertension   [facility] [place], Dionne Bucy, MD   1 year ago Right lower quadrant abdominal pain   [facility] [place], Dionne Bucy, MD   1 year ago Essential hypertension   [facility] [place], Dionne Bucy, MD   1 year ago Acute non-recurrent pansinusitis   [facility] [place], Dionne Bucy, MD      Future Appointments            In 3 weeks Bacigalupo, Dionne Bucy, MD [facility]            metFORMIN (GLUCOPHAGE-XR) 500 MG 24 hr tablet [Pharmacy Med Name: METFORMIN HYDROCHLORIDE ER 500 MG Tablet Extended Release 24 Hour] 180 tablet 3    Sig: TAKE 2 TABLETS (1,000 MG TOTAL) EVERY EVENING     Endocrinology:  Diabetes - Biguanides Failed - 03/09/2020  5:59 PM      Failed - HBA1C is between 0 and 7.9 and within 180 days    Hgb A1c MFr Bld  Date Value Ref Range Status  06/23/2019 7.4 (H) 4.8 - 5.6 % Final    Comment:             Prediabetes: 5.7 - 6.4          Diabetes: >6.4          Glycemic control for adults with diabetes: <7.0          Passed -  Cr in normal range and within 360 days    Creatinine, Ser  Date Value Ref Range Status  06/23/2019 0.97 0.57 - 1.00 mg/dL Final         Passed - eGFR in normal range and within 360 days    GFR calc Af Amer  Date Value Ref Range Status  06/23/2019 72 >59 mL/min/1.73 Final   GFR calc non Af Amer   Date Value Ref Range Status  06/23/2019 63 >59 mL/min/1.73 Final         Passed - Valid encounter within last 6 months    Recent Outpatient Visits          1 week ago Trochanteric bursitis of right hip   [facility], Dionne Bucy, MD   8 months ago Essential hypertension   [facility] [place], Dionne Bucy, MD   1 year ago Right lower quadrant abdominal pain   [facility] [place], Dionne Bucy, MD   1 year ago Essential hypertension   [place], Dionne Bucy, MD   1 year ago Acute non-recurrent pansinusitis   [facility] Bacig[place], Dionne Bucy, MD      Future Appointments            In 3 weeks Bacigalupo, Dionne Bucy, MD [facility]            lansoprazole (PREVACID) 30 MG capsule [Pharmacy Med Name: LANSOPRAZOLE 30 MG Capsule Delayed Release] 90 capsule 2    Sig: TAKE 1 [place]     Gastroenterology: Proton Pump Inhibitors Passed - 03/09/2020  5:59 PM      Passed - Valid encounter within last 12 months    Recent Outpatient Visits          1 week ago Trochanteric bursitis of right hip   [facility], Dionne Bucy, MD   8 months ago Essential hypertension   [place], Dionne Bucy, MD   1 year ago Right lower quadrant abdominal pain   [facility] [place], Dionne Bucy, MD   1 year ago Essential hypertension   [place], Dionne Bucy, MD   1 year ago Acute non-recurrent pansinusitis   [facility] Bacigal[place], Dionne Bucy, MD      Future Appointments            In 3 weeks Bacigalupo, Dionne Bucy, MD [facility] [place]

## 2020-03-09 NOTE — Telephone Encounter (Signed)
Requested medications are due for refill today? Yes  Requested medications are on active medication list?  Yes  Last Refill:    Metformin  03/21/2019  # 180 with three refills Vit D  03/18/2019  # 90 with 3 refills   Future visit scheduled?  Yes in 3 weeks.   Notes to Clinic:  Metformin failed Rx refill protocol due to no Hgb A1C in past 180 days.   Vit D failed Rx refill protocol due to phosphate level in past 360 days.

## 2020-03-22 ENCOUNTER — Other Ambulatory Visit: Payer: Self-pay | Admitting: Family Medicine

## 2020-03-22 ENCOUNTER — Telehealth: Payer: Self-pay

## 2020-03-22 MED ORDER — FUROSEMIDE 20 MG PO TABS
20.0000 mg | ORAL_TABLET | Freq: Every day | ORAL | 1 refills | Status: DC
Start: 2020-03-22 — End: 2020-10-12

## 2020-03-22 MED ORDER — FUROSEMIDE 20 MG PO TABS
20.0000 mg | ORAL_TABLET | Freq: Every day | ORAL | 1 refills | Status: DC
Start: 2020-03-22 — End: 2020-03-22

## 2020-03-22 NOTE — Telephone Encounter (Signed)
Requested Prescriptions  Pending Prescriptions Disp Refills  . furosemide (LASIX) 20 MG tablet 90 tablet 1    Sig: Take 1 tablet (20 mg total) by mouth daily.     Cardiovascular:  Diuretics - Loop Failed - 03/22/2020  9:28 AM      Failed - Na in normal range and within 360 days    Sodium  Date Value Ref Range Status  06/23/2019 133 (L) 134 - 144 mmol/L Final         Passed - K in normal range and within 360 days    Potassium  Date Value Ref Range Status  06/23/2019 4.5 3.5 - 5.2 mmol/L Final         Passed - Ca in normal range and within 360 days    Calcium  Date Value Ref Range Status  06/23/2019 10.1 8.7 - 10.3 mg/dL Final         Passed - Cr in normal range and within 360 days    Creatinine, Ser  Date Value Ref Range Status  06/23/2019 0.97 0.57 - 1.00 mg/dL Final         Passed - Last BP in normal range    BP Readings from Last 1 Encounters:  02/26/20 122/82         Passed - Valid encounter within last 6 months    Recent Outpatient Visits          3 weeks ago Trochanteric bursitis of right hip   [facility] [place], Marzella Schlein, MD   9 months ago Essential hypertension   [facility] [place] [place], Marzella Schlein, MD   1 year ago Right lower quadrant abdominal pain   [facility] [place], Marzella Schlein, MD   1 year ago Essential hypertension   [facility] [place], Marzella Schlein, MD   1 year ago Acute non-recurrent pansinusitis   [facility] Bacigal[place], Marzella Schlein, MD      Future Appointments            In 3 weeks Bacigalupo, Marzella Schlein, MD [facility]

## 2020-03-22 NOTE — Addendum Note (Signed)
Addended by: Kavin Leech E on: 03/22/2020 05:10 PM   Modules accepted: Orders

## 2020-03-22 NOTE — Telephone Encounter (Signed)
Pt request refill  furosemide (LASIX) 20 MG tablet  90 day  [facility] [place] [place] - [address] Phone:  [phone]  Fax:  [phone]

## 2020-03-22 NOTE — Telephone Encounter (Signed)
Copied from CRM [id number]. Topic: General - Inquiry >> Mar 22, 2020  8:57 AM Crist Infante wrote: Reason for CRM: June called for the pt: pt got in the mail a package fedex from [facility] genetic cardiovascular testing from Dr B. She said she also got a call after it was delivered , they left a message. She wants to know did Dr B order this?  It looks suspicious.

## 2020-03-28 DIAGNOSIS — Z23 Encounter for immunization: Secondary | ICD-10-CM | POA: Diagnosis not present

## 2020-03-31 ENCOUNTER — Telehealth: Payer: Self-pay | Admitting: Family Medicine

## 2020-03-31 ENCOUNTER — Ambulatory Visit: Payer: Medicare Other | Admitting: Family Medicine

## 2020-03-31 NOTE — Telephone Encounter (Signed)
Copied from CRM [id number]. Topic: Medicare AWV >> Mar 31, 2020  9:17 AM Claudette Laws R wrote: Reason for CRM:  Left message for patient to call back and schedule Medicare Annual Wellness Visit (AWV) either virtually or in office.  Last AWV 03/31/2019  Please schedule at anytime with [facility] Advisor.  If any questions, please contact me at [phone]

## 2020-04-12 ENCOUNTER — Ambulatory Visit (INDEPENDENT_AMBULATORY_CARE_PROVIDER_SITE_OTHER): Payer: Medicare Other | Admitting: Family Medicine

## 2020-04-12 ENCOUNTER — Encounter: Payer: Self-pay | Admitting: Family Medicine

## 2020-04-12 ENCOUNTER — Other Ambulatory Visit: Payer: Self-pay

## 2020-04-12 VITALS — BP 148/86 | HR 80 | Temp 98.1°F | Wt 211.0 lb

## 2020-04-12 DIAGNOSIS — I10 Essential (primary) hypertension: Secondary | ICD-10-CM | POA: Diagnosis not present

## 2020-04-12 DIAGNOSIS — Z79899 Other long term (current) drug therapy: Secondary | ICD-10-CM

## 2020-04-12 DIAGNOSIS — E1169 Type 2 diabetes mellitus with other specified complication: Secondary | ICD-10-CM

## 2020-04-12 DIAGNOSIS — N182 Chronic kidney disease, stage 2 (mild): Secondary | ICD-10-CM | POA: Diagnosis not present

## 2020-04-12 DIAGNOSIS — M7061 Trochanteric bursitis, right hip: Secondary | ICD-10-CM | POA: Diagnosis not present

## 2020-04-12 DIAGNOSIS — E785 Hyperlipidemia, unspecified: Secondary | ICD-10-CM

## 2020-04-12 DIAGNOSIS — D638 Anemia in other chronic diseases classified elsewhere: Secondary | ICD-10-CM

## 2020-04-12 DIAGNOSIS — E871 Hypo-osmolality and hyponatremia: Secondary | ICD-10-CM

## 2020-04-12 DIAGNOSIS — I429 Cardiomyopathy, unspecified: Secondary | ICD-10-CM | POA: Diagnosis not present

## 2020-04-12 DIAGNOSIS — J449 Chronic obstructive pulmonary disease, unspecified: Secondary | ICD-10-CM | POA: Diagnosis not present

## 2020-04-12 DIAGNOSIS — E039 Hypothyroidism, unspecified: Secondary | ICD-10-CM

## 2020-04-12 DIAGNOSIS — H6983 Other specified disorders of Eustachian tube, bilateral: Secondary | ICD-10-CM

## 2020-04-12 DIAGNOSIS — R600 Localized edema: Secondary | ICD-10-CM | POA: Diagnosis not present

## 2020-04-12 DIAGNOSIS — M898X9 Other specified disorders of bone, unspecified site: Secondary | ICD-10-CM

## 2020-04-12 NOTE — Assessment & Plan Note (Addendum)
Previous Cr 0.63  Control of HTN and diabetes are important  Avoid nephrotoxic medications Recheck Cr and adjust management as needed F/u 6 months

## 2020-04-12 NOTE — Assessment & Plan Note (Signed)
Continues to have pain wth palpation and movement consistent with R hip trochanteric bursitis Cortisone shot today F/u 6 months

## 2020-04-12 NOTE — Assessment & Plan Note (Addendum)
Previously well controlled Continue at current dose of levothyroxine F/u TSH and adjust synthroid as needed

## 2020-04-12 NOTE — Assessment & Plan Note (Signed)
Unchanged at this visit, mild systolic ejection murmur in RU Sternal border Will continue to monitor with BP control F/u 6 months

## 2020-04-12 NOTE — Assessment & Plan Note (Signed)
Well controlled Patient has adhered to compression stockings and improving diet, lost 15 lbs. CTM with lifestyle modifications, continue use of compression stockings F/u 6 months

## 2020-04-12 NOTE — Assessment & Plan Note (Signed)
Does not report any current bone pain Will f/u at next visit

## 2020-04-12 NOTE — Assessment & Plan Note (Addendum)
Continues to be stable Continue Spiriva

## 2020-04-12 NOTE — Progress Notes (Signed)
Established patient visit   Patient: Emma Fletcher   DOB: [birth year]   64 y.o. Female  MRN: [id number] Visit Date: 04/12/2020  Today's healthcare provider: Lavon Paganini, MD   Chief Complaint  Patient presents with   Diabetes   Hyperlipidemia   Hypertension   Hip Pain   Subjective    HPI   64 yo female w/ pmh of CKD, HTN, T2DM, COPD, hypothyroidism, hyperlipidemia and anemia of chronic disease presents with a months history of r hip pain. She notes that her hip is painful with movement and is often worse in the morning, slightly improving throughout the day. It is tender on palpation, radiating to the inner thigh. She has no other arthralgias, or muscle aches. ROS grossly negative.  Patient's diet has changed little since her last visit, she reports that she is trying to eat more vegetables, chicken and fish. Her exercise is limited and is mostly encompassed by her 15 minute walk to and from work.    Social History   Tobacco Use   Smoking status: Never Smoker   Smokeless tobacco: Never Used  Vaping Use   Vaping Use: Never used  Substance Use Topics   Alcohol use: No    Alcohol/week: 0.0 standard drinks   Drug use: No       Medications: Outpatient Medications Prior to Visit  Medication Sig   atorvastatin (LIPITOR) 20 MG tablet TAKE 1 TABLET EVERY DAY   Blood Glucose Monitoring Suppl (ONE TOUCH ULTRA 2) w/Device KIT Use as directed to check blood sugar once daily   Cholecalciferol (VITAMIN D3) 125 MCG (5000 UT) CAPS TAKE 1 CAPSULE EVERY DAY   Ferrous Sulfate (IRON) 325 (65 Fe) MG TABS Take 2 tablets by mouth daily.   fluticasone (FLONASE) 50 MCG/ACT nasal spray Place 2 sprays into both nostrils daily.   furosemide (LASIX) 20 MG tablet Take 1 tablet (20 mg total) by mouth daily.   glucose blood (ONE TOUCH ULTRA TEST) test strip Use as instructed to check blood glucose daily   lansoprazole (PREVACID) 30 MG capsule TAKE 1 CAPSULE EVERY DAY    levothyroxine (SYNTHROID) 100 MCG tablet Take 1 tablet (100 mcg total) by mouth daily.   lisinopril (ZESTRIL) 20 MG tablet TAKE 1 TABLET EVERY DAY   meloxicam (MOBIC) 15 MG tablet Take 1 tablet (15 mg total) by mouth daily.   metFORMIN (GLUCOPHAGE-XR) 500 MG 24 hr tablet TAKE 2 TABLETS (1,000 MG TOTAL) EVERY EVENING   Multiple Vitamins-Minerals (ONE-A-DAY WOMENS 50 PLUS PO) Take 1 tablet by mouth daily.   ONETOUCH DELICA LANCETS 37C MISC Use as directed to check blood glucose daily   polyethylene glycol powder (GLYCOLAX/MIRALAX) 17 GM/SCOOP powder Please use one cup of miralax daily.   potassium chloride (KLOR-CON) 10 MEQ tablet TAKE 1 TABLET EVERY DAY   SPIRIVA HANDIHALER 18 MCG inhalation capsule INHALE THE CONTENTS OF 1 CAPSULE EVERY DAY  VIA  HANDIHALER   SYMBICORT 160-4.5 MCG/ACT inhaler INHALE 2 PUFFS TWICE DAILY   vitamin B-12 (CYANOCOBALAMIN) 1000 MCG tablet Take 1,000 mcg by mouth daily.   vitamin C (ASCORBIC ACID) 500 MG tablet Take 500 mg by mouth daily.   [DISCONTINUED] Choline Fenofibrate (FENOFIBRIC ACID) 135 MG CPDR Take 135 mg by mouth daily.    No facility-administered medications prior to visit.    Review of Systems  Constitutional: Negative.   HENT: Negative.   Respiratory: Negative.   Cardiovascular: Negative.   Gastrointestinal: Positive for diarrhea.  Endocrine: Negative.  Genitourinary: Negative.   Musculoskeletal: Positive for arthralgias.  Skin: Negative.   Allergic/Immunologic: Negative.   Neurological: Negative.   Hematological: Negative.   Psychiatric/Behavioral: Negative.        Objective    BP (!) 148/86 (BP Location: Right Arm, Patient Position: Sitting, Cuff Size: Large)    Pulse 80    Temp 98.1 F (36.7 C) (Oral)    Wt 211 lb (95.7 kg)    LMP  (LMP Unknown)    BMI 34.06 kg/m     Physical Exam   General: Well appearing, appropriate affect sitting upright on the exam table Lungs: CTA bilaterally, normal work of  breathing Heart:RRR, mild I/IV systolic ejection murmur heart best at left upper sternal border Abdomen: Nontender, nondistended Extremities: R Hip tender to palpation over greater trochanter, all other extremities normal.  Derm: no observed Rashes Psych: A & O x3   No results found for any visits on 04/12/20.  Assessment & Plan     Problem List Items Addressed This Visit      Cardiovascular and Mediastinum   Essential hypertension - Primary    Currently 145/80, patient reports no changes in home medications. Recheck at next visit, if persists above 140 consider increasing lisinopril dose to 40 mg. Recheck metabolic panel F/u 6 months      Relevant Orders   Comprehensive metabolic panel   Secondary cardiomyopathy ([place])    Unchanged at this visit, mild systolic ejection murmur in RU Sternal border Will continue to monitor with BP control F/u 6 months        Respiratory   Chronic obstructive pulmonary disease ([place])    Continues to be stable Continue Spiriva        Endocrine   T2DM (type 2 diabetes mellitus) ([place])    Associated with hypertension and hyperlipidemia, currently on Statin and Lisinopril A1c 06/2019 was 7.5, patient journal reports fasting glucose between 140-160  No hypoglycemic events, patient has history of poor reaction to glyburide No paraesthesias, vision changes or stigmata of chronic T2DM Will add GLP-1 agonist to regimen, F/u A1C Has not had annual eye exam yet, encouraged to book F/u 6 months      Relevant Orders   Hemoglobin A1c   Hyperlipidemia associated with type 2 diabetes mellitus ([place])    Tolerating statin well, FLP from 06/2019 wnl Check FLP and LDLs F/u 6 months      Relevant Orders   Comprehensive metabolic panel   Lipid panel   Hypothyroidism    Previously well controlled Continue at current dose of levothyroxine F/u TSH and adjust synthroid as needed      Relevant Orders   TSH     Nervous and Auditory   Dysfunction of  both eustachian tubes    Eustachian tube dysfunction appears to be resolved Keep flonase available PRN for congestion and allergies F/u 6 months        Musculoskeletal and Integument   Bursitis of right hip    Continues to have pain wth palpation and movement consistent with R hip trochanteric bursitis Cortisone shot today F/u 6 months        Genitourinary   CKD (chronic kidney disease), stage II    Previous Cr 0.63  Control of HTN and diabetes are important  Avoid nephrotoxic medications Recheck Cr and adjust management as needed F/u 6 months      Relevant Orders   Comprehensive metabolic panel     Other   EDEMA    Well  controlled Patient has adhered to compression stockings and improving diet, lost 15 lbs. CTM with lifestyle modifications, continue use of compression stockings F/u 6 months      Hyponatremia    133 on last visit Recheck CMP CTM      Anemia of chronic disease    Previous HgB 10.5 F/u CBC and B12       Relevant Orders   CBC w/Diff/Platelet   B12   Bone pain    Does not report any current bone pain Will f/u at next visit       Other Visit Diagnoses    Long-term use of high-risk medication       Relevant Orders   B12        INJECTION: Patient was given informed consent,. Appropriate time out was taken. Area prepped and draped in usual sterile fashion. 1 cc of depo-medrol 40 mg/ml plus  4 cc of 1% lidocaine without epinephrine was injected into the R greater trochanteric bursa using a(n) lateral approach. The patient tolerated the procedure well. There were no complications. Post procedure instructions were given.     Return in about 6 months (around 10/11/2020) for chronic disease f/u.     Note was initiated by Stark Klein, MS3    Patient seen along with MS3 student [facility]. I personally evaluated this patient along with the student, and verified all aspects of the history, physical exam, and medical decision making as  documented by the student. I agree with the student's documentation and have made all necessary edits.  Anisten Tomassi, Dionne Bucy, MD, MPH [facility]

## 2020-04-12 NOTE — Assessment & Plan Note (Addendum)
Currently 145/80, patient reports no changes in home medications. Recheck at next visit, if persists above 140 consider increasing lisinopril dose to 40 mg. Recheck metabolic panel F/u 6 months

## 2020-04-12 NOTE — Assessment & Plan Note (Addendum)
Eustachian tube dysfunction appears to be resolved Keep flonase available PRN for congestion and allergies F/u 6 months

## 2020-04-12 NOTE — Assessment & Plan Note (Addendum)
Associated with hypertension and hyperlipidemia, currently on Statin and Lisinopril A1c 06/2019 was 7.5, patient journal reports fasting glucose between 140-160  No hypoglycemic events, patient has history of poor reaction to glyburide No paraesthesias, vision changes or stigmata of chronic T2DM Will add GLP-1 agonist to regimen, F/u A1C Has not had annual eye exam yet, encouraged to book F/u 6 months

## 2020-04-12 NOTE — Assessment & Plan Note (Signed)
Previous HgB 10.5 F/u CBC and B12

## 2020-04-12 NOTE — Assessment & Plan Note (Signed)
133 on last visit Recheck CMP CTM

## 2020-04-12 NOTE — Assessment & Plan Note (Addendum)
Tolerating statin well, FLP from 06/2019 wnl Check FLP and LDLs F/u 6 months

## 2020-04-13 LAB — LIPID PANEL
Chol/HDL Ratio: 2.3 ratio (ref 0.0–4.4)
Cholesterol, Total: 154 mg/dL (ref 100–199)
HDL: 66 mg/dL (ref >39)
LDL Chol Calc (NIH): 70 mg/dL (ref 0–99)
Triglycerides: 99 mg/dL (ref 0–149)
VLDL Cholesterol Cal: 18 mg/dL (ref 5–40)

## 2020-04-13 LAB — CBC WITH DIFFERENTIAL/PLATELET
Basophils Absolute: 0 10*3/uL (ref 0.0–0.2)
Basos: 0 %
EOS (ABSOLUTE): 0 10*3/uL (ref 0.0–0.4)
Eos: 0 %
Hematocrit: 32.9 % — ABNORMAL LOW (ref 34.0–46.6)
Hemoglobin: 10.9 g/dL — ABNORMAL LOW (ref 11.1–15.9)
Immature Grans (Abs): 0 10*3/uL (ref 0.0–0.1)
Immature Granulocytes: 0 %
Lymphocytes Absolute: 1.1 10*3/uL (ref 0.7–3.1)
Lymphs: 13 %
MCH: 30.4 pg (ref 26.6–33.0)
MCHC: 33.1 g/dL (ref 31.5–35.7)
MCV: 92 fL (ref 79–97)
Monocytes Absolute: 0.6 10*3/uL (ref 0.1–0.9)
Monocytes: 7 %
Neutrophils Absolute: 7.1 10*3/uL — ABNORMAL HIGH (ref 1.4–7.0)
Neutrophils: 80 %
Platelets: 328 10*3/uL (ref 150–450)
RBC: 3.58 x10E6/uL — ABNORMAL LOW (ref 3.77–5.28)
RDW: 11.6 % — ABNORMAL LOW (ref 11.7–15.4)
WBC: 8.9 10*3/uL (ref 3.4–10.8)

## 2020-04-13 LAB — VITAMIN B12: Vitamin B-12: 789 pg/mL (ref 232–1245)

## 2020-04-13 LAB — COMPREHENSIVE METABOLIC PANEL
ALT: 17 IU/L (ref 0–32)
AST: 14 IU/L (ref 0–40)
Albumin/Globulin Ratio: 1.8 (ref 1.2–2.2)
Albumin: 4.3 g/dL (ref 3.8–4.8)
Alkaline Phosphatase: 172 IU/L — ABNORMAL HIGH (ref 44–121)
BUN/Creatinine Ratio: 26 (ref 12–28)
BUN: 19 mg/dL (ref 8–27)
Bilirubin Total: 0.4 mg/dL (ref 0.0–1.2)
CO2: 21 mmol/L (ref 20–29)
Calcium: 10 mg/dL (ref 8.7–10.3)
Chloride: 94 mmol/L — ABNORMAL LOW (ref 96–106)
Creatinine, Ser: 0.72 mg/dL (ref 0.57–1.00)
GFR calc Af Amer: 103 mL/min/{1.73_m2} (ref >59)
GFR calc non Af Amer: 89 mL/min/{1.73_m2} (ref >59)
Globulin, Total: 2.4 g/dL (ref 1.5–4.5)
Glucose: 220 mg/dL — ABNORMAL HIGH (ref 65–99)
Potassium: 4.6 mmol/L (ref 3.5–5.2)
Sodium: 133 mmol/L — ABNORMAL LOW (ref 134–144)
Total Protein: 6.7 g/dL (ref 6.0–8.5)

## 2020-04-13 LAB — HEMOGLOBIN A1C
Est. average glucose Bld gHb Est-mCnc: 166 mg/dL
Hgb A1c MFr Bld: 7.4 % — ABNORMAL HIGH (ref 4.8–5.6)

## 2020-04-13 LAB — TSH: TSH: 0.982 u[IU]/mL (ref 0.450–4.500)

## 2020-04-14 ENCOUNTER — Telehealth: Payer: Self-pay

## 2020-04-14 NOTE — Telephone Encounter (Signed)
-----   Message from Erasmo Downer, MD sent at 04/13/2020  3:59 PM EDT ----- Normal/stable labs. A1c is still elevated. Recommend starting Ozempic 0.25mg  injection weekly.  F/u in 3 months and repeat A1c

## 2020-04-14 NOTE — Telephone Encounter (Signed)
Left message for June (Pt's sister) to call back to go over pt's lab results.  PEC please advise June as below.   Thanks,   -Vernona Rieger

## 2020-04-19 ENCOUNTER — Telehealth: Payer: Self-pay

## 2020-04-19 MED ORDER — OZEMPIC (0.25 OR 0.5 MG/DOSE) 2 MG/1.5ML ~~LOC~~ SOPN: 0.2500 mg | PEN_INJECTOR | SUBCUTANEOUS | 3 refills | Status: DC

## 2020-04-19 NOTE — Telephone Encounter (Signed)
-----   Message from Adline Peals, [place] sent at 04/19/2020  4:24 PM EDT -----  ----- Message ----- From: Carlean Purl, RN Sent: 04/19/2020   8:50 AM EDT To: [facility]  Result note read to pt's sister June,agreeable to Ozempic. Please send to: [facility] - [place], [place] - [address]  [address] [place] [address]  Phone:  [phone] Fax:  [phone]   Will CB for appt F/U A1C

## 2020-04-19 NOTE — Telephone Encounter (Signed)
Result note read to pt's sister June,agreeable to Ozempic. Please send to: [facility] - [place], [place] - [address]  [address] [place] [address]  Phone: [phone] Fax: [phone]   Will CB for appt F/U A1C

## 2020-04-19 NOTE — Telephone Encounter (Signed)
rx sent

## 2020-05-06 NOTE — Progress Notes (Deleted)
Subjective:   KLAIRE COURT is a 64 y.o. female who presents for Medicare Annual (Subsequent) preventive examination.  I connected with Juanetta Snow today by telephone and verified that I am speaking with the correct person using two identifiers. Location patient: home Location provider: work Persons participating in the virtual visit: patient, provider.   I discussed the limitations, risks, security and privacy concerns of performing an evaluation and management service by telephone and the availability of in person appointments. I also discussed with the patient that there may be a patient responsible charge related to this service. The patient expressed understanding and verbally consented to this telephonic visit.    Interactive audio and video telecommunications were attempted between this provider and patient, however failed, due to patient having technical difficulties OR patient did not have access to video capability.  We continued and completed visit with audio only.   Review of Systems    N/A        Objective:    There were no vitals filed for this visit. There is no height or weight on file to calculate BMI.  Advanced Directives 03/31/2019 07/02/2018 04/24/2018 10/19/2017 06/30/2017 02/23/2017 12/12/2016  Does Patient Have a Medical Advance Directive? Yes Yes Yes No Yes Yes No  Type of Paramedic of [place];Living will - [place];Living will - Healthcare Power of [place];Living will -  Does patient want to make changes to medical advance directive? - No - Patient declined - - No - Patient declined - -  Copy of [place] in Chart? Yes - validated most recent copy scanned in chart (See row information) - - - No - copy requested No - copy requested -  Would patient like information on creating a medical advance directive? - - - No - Patient declined - - No - Patient declined     Current Medications (verified) Outpatient Encounter Medications as of 05/10/2020  Medication Sig  . atorvastatin (LIPITOR) 20 MG tablet TAKE 1 TABLET EVERY DAY  . Blood Glucose Monitoring Suppl (ONE TOUCH ULTRA 2) w/Device KIT Use as directed to check blood sugar once daily  . Cholecalciferol (VITAMIN D3) 125 MCG (5000 UT) CAPS TAKE 1 CAPSULE EVERY DAY  . Ferrous Sulfate (IRON) 325 (65 Fe) MG TABS Take 2 tablets by mouth daily.  . fluticasone (FLONASE) 50 MCG/ACT nasal spray Place 2 sprays into both nostrils daily.  . furosemide (LASIX) 20 MG tablet Take 1 tablet (20 mg total) by mouth daily.  Marland Kitchen glucose blood (ONE TOUCH ULTRA TEST) test strip Use as instructed to check blood glucose daily  . lansoprazole (PREVACID) 30 MG capsule TAKE 1 CAPSULE EVERY DAY  . levothyroxine (SYNTHROID) 100 MCG tablet Take 1 tablet (100 mcg total) by mouth daily.  Marland Kitchen lisinopril (ZESTRIL) 20 MG tablet TAKE 1 TABLET EVERY DAY  . meloxicam (MOBIC) 15 MG tablet Take 1 tablet (15 mg total) by mouth daily.  . metFORMIN (GLUCOPHAGE-XR) 500 MG 24 hr tablet TAKE 2 TABLETS (1,000 MG TOTAL) EVERY EVENING  . Multiple Vitamins-Minerals (ONE-A-DAY WOMENS 50 PLUS PO) Take 1 tablet by mouth daily.  Glory Rosebush DELICA LANCETS 00L MISC Use as directed to check blood glucose daily  . polyethylene glycol powder (GLYCOLAX/MIRALAX) 17 GM/SCOOP powder Please use one cup of miralax daily.  . potassium chloride (KLOR-CON) 10 MEQ tablet TAKE 1 TABLET EVERY DAY  . Semaglutide,0.25 or 0.5MG/DOS, (OZEMPIC, 0.25 OR 0.5 MG/DOSE,) 2 MG/1.5ML SOPN Inject  0.25 mg into the skin once a week.  . SPIRIVA HANDIHALER 18 MCG inhalation capsule INHALE THE CONTENTS OF 1 CAPSULE EVERY DAY  VIA  HANDIHALER  . SYMBICORT 160-4.5 MCG/ACT inhaler INHALE 2 PUFFS TWICE DAILY  . vitamin B-12 (CYANOCOBALAMIN) 1000 MCG tablet Take 1,000 mcg by mouth daily.  . vitamin C (ASCORBIC ACID) 500 MG tablet Take 500 mg by mouth daily.   No facility-administered encounter  medications on file as of 05/10/2020.    Allergies (verified) Patient has no known allergies.   History: Past Medical History:  Diagnosis Date  . Anemia   . Bronchitis   . Cardiomyopathy secondary   . Developmental delay   . Diabetes mellitus without complication ([place])   . Edema   . Hypercholesterolemia   . Unspecified essential hypertension    Past Surgical History:  Procedure Laterality Date  . COLONOSCOPY    . COLONOSCOPY WITH PROPOFOL N/A 04/24/2018   Procedure: COLONOSCOPY WITH PROPOFOL;  Surgeon: Jonathon Bellows, MD;  Location: [facility] ENDOSCOPY;  Service: Gastroenterology;  Laterality: N/A;  . none     Family History  Problem Relation Age of Onset  . Diabetes Mother   . Hypertension Mother   . Stroke Mother   . Diabetes Father   . Coronary artery disease Father   . Breast cancer Maternal Aunt   . Lung cancer Maternal Uncle   . Prostate cancer Paternal Uncle   . Uterine cancer Other    Social History   Socioeconomic History  . Marital status: Single    Spouse name: Not on file  . Number of children: 0  . Years of education: Not on file  . Highest education level: High school graduate  Occupational History  . Occupation: Part time works in [place] Use  . Smoking status: Never Smoker  . Smokeless tobacco: Never Used  Vaping Use  . Vaping Use: Never used  Substance and Sexual Activity  . Alcohol use: No    Alcohol/week: 0.0 standard drinks  . Drug use: No  . Sexual activity: Not on file  Other Topics Concern  . Not on file  Social History Narrative  . Not on file   Social Determinants of Health   Financial Resource Strain:   . Difficulty of Paying Living Expenses: Not on file  Food Insecurity:   . Worried About Charity fundraiser in the Last Year: Not on file  . Ran Out of Food in the Last Year: Not on file  Transportation Needs:   . Lack of Transportation (Medical): Not on file  . Lack of Transportation (Non-Medical): Not on file   Physical Activity:   . Days of Exercise per Week: Not on file  . Minutes of Exercise per Session: Not on file  Stress:   . Feeling of Stress : Not on file  Social Connections:   . Frequency of Communication with Friends and Family: Not on file  . Frequency of Social Gatherings with Friends and Family: Not on file  . Attends Religious Services: Not on file  . Active Member of Clubs or Organizations: Not on file  . Attends Archivist Meetings: Not on file  . Marital Status: Not on file    Tobacco Counseling Counseling given: Not Answered   Clinical Intake:                 Diabetic? Yes  Nutrition Risk Assessment:  Has the patient had any N/V/D within the last 2 months?  No  Does the patient have any non-healing wounds?  No  Has the patient had any unintentional weight loss or weight gain?  No   Diabetes:  Is the patient diabetic?  Yes  If diabetic, was a CBG obtained today?  No  Did the patient bring in their glucometer from home?  No  How often do you monitor your CBG's? ***.   Financial Strains and Diabetes Management:  Are you having any financial strains with the device, your supplies or your medication? No .  Does the patient want to be seen by Chronic Care Management for management of their diabetes?  No  Would the patient like to be referred to a Nutritionist or for Diabetic Management?  No   Diabetic Exams:  Diabetic Eye Exam: Overdue for diabetic eye exam. Pt has been advised about the importance in completing this exam. Patient advised to call and schedule an eye exam. Diabetic Foot Exam: Overdue, Pt has been advised about the importance in completing this exam. Pt is scheduled for diabetic foot exam on ***.          Activities of Daily Living In your present state of health, do you have any difficulty performing the following activities: 04/12/2020  Hearing? Y  Vision? Y  Difficulty concentrating or making decisions? N  Walking  or climbing stairs? N  Dressing or bathing? N  Doing errands, shopping? N  Some recent data might be hidden    Patient Care Team: Virginia Crews, MD as PCP - General (Family Medicine) Gardiner Barefoot, DPM as Consulting Physician (Podiatry) Virgel Manifold, MD as Consulting Physician (Gastroenterology)  Indicate any recent Medical Services you may have received from other than [facility] providers in the past year (date may be approximate).     Assessment:   This is a routine wellness examination for [place].  Hearing/Vision screen No exam data present  Dietary issues and exercise activities discussed:    Goals    . DIET - INCREASE WATER INTAKE     Recommend to drink at least 6-8 8oz glasses of water per day.      Depression Screen PHQ 2/9 Scores 04/12/2020 03/31/2019 03/26/2018 03/26/2018 09/20/2017  PHQ - 2 Score 0 0 0 0 0  PHQ- 9 Score 0 - 0 - -    Fall Risk Fall Risk  04/12/2020 03/31/2019 03/26/2018 09/20/2017  Falls in the past year? 1 0 Yes Yes  Number falls in past yr: 0 0 1 1  Injury with Fall? 0 0 - Yes  Follow up Falls evaluation completed - - Falls evaluation completed    Any stairs in or around the home? {YES/NO:21197} If so, are there any without handrails? {YES/NO:21197} Home free of loose throw rugs in walkways, pet beds, electrical cords, etc? Yes  Adequate lighting in your home to reduce risk of falls? Yes   ASSISTIVE DEVICES UTILIZED TO PREVENT FALLS:  Life alert? {YES/NO:21197} Use of a cane, walker or w/c? {YES/NO:21197} Grab bars in the bathroom? {YES/NO:21197} Shower chair or bench in shower? {YES/NO:21197} Elevated toilet seat or a handicapped toilet? {YES/NO:21197}   Cognitive Function:     6CIT Screen 03/31/2019  What Year? 0 points  What month? 0 points  What time? 0 points  Count back from 20 0 points  Months in reverse 0 points  Repeat phrase 2 points  Total Score 2    Immunizations Immunization History  Administered  Date(s) Administered  . Influenza,inj,Quad PF,6+ Mos 03/26/2018  . Influenza-Unspecified  03/30/2019  . Pneumococcal Polysaccharide-23 03/26/2018  . Tdap 10/20/2010, 03/30/2019    TDAP status: Up to date {Flu Vaccine status:2101806} {Covid-19 vaccine status:2101808}  Qualifies for Shingles Vaccine? Yes   Zostavax completed No   Shingrix Completed?: No.    Education has been provided regarding the importance of this vaccine. Patient has been advised to call insurance company to determine out of pocket expense if they have not yet received this vaccine. Advised may also receive vaccine at local pharmacy or Health Dept. Verbalized acceptance and understanding.  Screening Tests Health Maintenance  Topic Date Due  . COVID-19 Vaccine (1) Never done  . OPHTHALMOLOGY EXAM  01/24/2019  . INFLUENZA VACCINE  01/18/2020  . FOOT EXAM  04/27/2020  . HEMOGLOBIN A1C  10/11/2020  . PAP SMEAR-Modifier  03/26/2021  . MAMMOGRAM  11/18/2021  . COLONOSCOPY  04/24/2028  . TETANUS/TDAP  03/29/2029  . PNEUMOCOCCAL POLYSACCHARIDE VACCINE AGE 70-64 HIGH RISK  Completed  . Hepatitis C Screening  Completed  . HIV Screening  Completed    Health Maintenance  Health Maintenance Due  Topic Date Due  . COVID-19 Vaccine (1) Never done  . OPHTHALMOLOGY EXAM  01/24/2019  . INFLUENZA VACCINE  01/18/2020  . FOOT EXAM  04/27/2020    Colorectal cancer screening: Completed 04/24/18. Repeat every 10 years Mammogram status: Completed 11/19/19. Repeat every year  Lung Cancer Screening: (Low Dose CT Chest recommended if Age 37-80 years, 30 pack-year currently smoking OR have quit w/in 15years.) {DOES NOT does:27190::"does not"} qualify.   Lung Cancer Screening Referral: ***  Additional Screening:  Hepatitis C Screening: Up to date  Vision Screening: Recommended annual ophthalmology exams for early detection of glaucoma and other disorders of the eye. Is the patient up to date with their annual eye exam?  Yes   Who is the provider or what is the name of the office in which the patient attends annual eye exams? *** If pt is not established with a provider, would they like to be referred to a provider to establish care? No .   Dental Screening: Recommended annual dental exams for proper oral hygiene  Community Resource Referral / Chronic Care Management: CRR required this visit?  No   CCM required this visit?  No      Plan:     I have personally reviewed and noted the following in the patient's chart:   . Medical and social history . Use of alcohol, tobacco or illicit drugs  . Current medications and supplements . Functional ability and status . Nutritional status . Physical activity . Advanced directives . List of other physicians . Hospitalizations, surgeries, and ER visits in previous 12 months . Vitals . Screenings to include cognitive, depression, and falls . Referrals and appointments  In addition, I have reviewed and discussed with patient certain preventive protocols, quality metrics, and best practice recommendations. A written personalized care plan for preventive services as well as general preventive health recommendations were provided to patient.     Granger Chui [place], [place]   01/75/1025   Nurse Notes: ***

## 2020-05-19 ENCOUNTER — Ambulatory Visit: Payer: Self-pay

## 2020-05-19 NOTE — Telephone Encounter (Signed)
June Watson, sister on [place], called and says the patient went to check up with the doctor for her hearing aid. The doctor told her she has drainage in her hear towards the outer that looks like an infection, so advised her to call the PCP. June says the patient hasn't complained of pain, just soreness after wearing the new hearing aid. No other symptoms. Appointment scheduled for tomorrow at 1320 with Marvell Fuller, FNP, care advice given, June verbalized understanding.  Reason for Disposition . Ear pain  Answer Assessment - Initial Assessment Questions 1. LOCATION: "Which ear is involved?"      Left 2. COLOR: "What is the color of the discharge?"      Dried up in the inside of ear 3. CONSISTENCY: "How runny is the discharge? Could it be water?"      Not seen any drainage 4. ONSET: "When did you first notice the discharge?"     Noticed today 5. PAIN: "Is there any earache?" "How bad is it?"  (Scale 1-10; or mild, moderate, severe)     No pain 6. OBJECTS: "Have you put anything in your ear?" (e.g., Q-tip, other object)      No, other than the hearing aid 7. OTHER SYMPTOMS: "Do you have any other symptoms?" (e.g., headache, fever, dizziness, vomiting, runny nose)     No 8. PREGNANCY: "Is there any chance you are pregnant?" "When was your last menstrual period?"     No  Protocols used: EAR - DISCHARGE-A-AH

## 2020-05-20 ENCOUNTER — Other Ambulatory Visit: Payer: Self-pay

## 2020-05-20 ENCOUNTER — Ambulatory Visit (INDEPENDENT_AMBULATORY_CARE_PROVIDER_SITE_OTHER): Payer: Medicare Other | Admitting: Adult Health

## 2020-05-20 ENCOUNTER — Encounter: Payer: Self-pay | Admitting: Adult Health

## 2020-05-20 VITALS — BP 158/79 | HR 71 | Temp 98.0°F | Resp 16 | Wt 216.0 lb

## 2020-05-20 DIAGNOSIS — J3089 Other allergic rhinitis: Secondary | ICD-10-CM

## 2020-05-20 DIAGNOSIS — H60502 Unspecified acute noninfective otitis externa, left ear: Secondary | ICD-10-CM | POA: Insufficient documentation

## 2020-05-20 DIAGNOSIS — H04209 Unspecified epiphora, unspecified lacrimal gland: Secondary | ICD-10-CM | POA: Insufficient documentation

## 2020-05-20 DIAGNOSIS — R067 Sneezing: Secondary | ICD-10-CM

## 2020-05-20 DIAGNOSIS — J309 Allergic rhinitis, unspecified: Secondary | ICD-10-CM | POA: Insufficient documentation

## 2020-05-20 MED ORDER — FLUTICASONE PROPIONATE 50 MCG/ACT NA SUSP: 2.0000 | Freq: Every day | NASAL | 6 refills | Status: DC

## 2020-05-20 MED ORDER — NEOMYCIN-POLYMYXIN-HC 3.5-10000-1 OT SOLN: 3.0000 [drp] | Freq: Three times a day (TID) | OTIC | 0 refills | Status: DC

## 2020-05-20 MED ORDER — CETIRIZINE HCL 10 MG PO TABS
10.0000 mg | ORAL_TABLET | Freq: Every day | ORAL | 2 refills | Status: DC
Start: 2020-05-20 — End: 2020-11-01

## 2020-05-20 NOTE — Patient Instructions (Signed)
Otitis Media, Adult  Otitis media means that the middle ear is red and swollen (inflamed) and full of fluid. The condition usually goes away on its own. Follow these instructions at home:  Take over-the-counter and prescription medicines only as told by your doctor.  If you were prescribed an antibiotic medicine, take it as told by your doctor. Do not stop taking the antibiotic even if you start to feel better.  Keep all follow-up visits as told by your doctor. This is important. Contact a doctor if:  You have bleeding from your nose.  There is a lump on your neck.  You are not getting better in 5 days.  You feel worse instead of better. Get help right away if:  You have pain that is not helped with medicine.  You have swelling, redness, or pain around your ear.  You get a stiff neck.  You cannot move part of your face (paralyzed).  You notice that the bone behind your ear hurts when you touch it.  You get a very bad headache. Summary  Otitis media means that the middle ear is red, swollen, and full of fluid.  This condition usually goes away on its own. In some cases, treatment may be needed.  If you were prescribed an antibiotic medicine, take it as told by your doctor. This information is not intended to replace advice given to you by your health care provider. Make sure you discuss any questions you have with your health care provider. Document Revised: 05/18/2017 Document Reviewed: 06/26/2016 Elsevier Patient Education  2020 [employer]  

## 2020-05-20 NOTE — Progress Notes (Signed)
Established patient visit   Patient: Emma Fletcher   DOB: 08/18/1955   64 y.o. Female  MRN: [id number] Visit Date: 05/20/2020  Today's healthcare provider: Marcille Buffy, FNP   Chief Complaint  Patient presents with   Ear Pain  June Watson sister legal guardian is at visit.  Subjective    Otalgia  There is pain in the left ear. This is a new problem. The current episode started 1 to 4 weeks ago. Associated symptoms include rhinorrhea. Pertinent negatives include no abdominal pain, coughing, diarrhea, ear discharge, headaches, hearing loss, neck pain, rash, sore throat or vomiting. Her past medical history is significant for hearing loss.    Ear pain x 2 weeks, she received a new hearing aide around two weeks ago and it started to hurt between that time. Says audiologist said the left ear canal looked very irritated a week ago.  Denies any drainage from ear.    Patient  denies any fever, body aches,chills, rash, chest pain, shortness of breath, nausea, vomiting, or diarrhea.  Denies dizziness, lightheadedness, pre syncopal or syncopal episodes.   Patient Active Problem List   Diagnosis Date Noted   Acute otitis externa of left ear 05/20/2020   Sneezing with watery eyes 05/20/2020   Allergic rhinitis 05/20/2020   Bursitis of right hip 02/26/2020   Bone pain 09/15/2019   Chronic obstructive pulmonary disease ([place]) 06/23/2019   Hyperlipidemia associated with type 2 diabetes mellitus ([place]) 09/20/2017   Hypothyroidism 09/20/2017   Dysfunction of both eustachian tubes 09/20/2017   CKD (chronic kidney disease), stage II 05/16/2016   T2DM (type 2 diabetes mellitus) ([place]) 12/18/2015   Hyponatremia 12/18/2015   Anemia of chronic disease 12/18/2015   Essential hypertension 11/10/2008   Secondary cardiomyopathy ([place]) 11/10/2008   EDEMA 11/10/2008   Past Medical History:  Diagnosis Date   Anemia    Bronchitis    Cardiomyopathy secondary     Developmental delay    Diabetes mellitus without complication ([place])    Edema    Hypercholesterolemia    Unspecified essential hypertension    Past Surgical History:  Procedure Laterality Date   COLONOSCOPY     COLONOSCOPY WITH PROPOFOL N/A 04/24/2018   Procedure: COLONOSCOPY WITH PROPOFOL;  Surgeon: Jonathon Bellows, MD;  Location: [facility] ENDOSCOPY;  Service: Gastroenterology;  Laterality: N/A;   none     Social History   Tobacco Use   Smoking status: Never Smoker   Smokeless tobacco: Never Used  Vaping Use   Vaping Use: Never used  Substance Use Topics   Alcohol use: No    Alcohol/week: 0.0 standard drinks   Drug use: No   Social History   Socioeconomic History   Marital status: Single    Spouse name: Not on file   Number of children: 0   Years of education: Not on file   Highest education level: High school graduate  Occupational History   Occupation: Part time works in [place] Use   Smoking status: Never Smoker   Smokeless tobacco: Never Used  Scientific laboratory technician Use: Never used  Substance and Sexual Activity   Alcohol use: No    Alcohol/week: 0.0 standard drinks   Drug use: No   Sexual activity: Not on file  Other Topics Concern   Not on file  Social History Narrative   Not on file   Social Determinants of Health   Financial Resource Strain:    Difficulty of Paying Living Expenses:  Not on file  Food Insecurity:    Worried About Charity fundraiser in the Last Year: Not on file   Ran Out of Food in the Last Year: Not on file  Transportation Needs:    Lack of Transportation (Medical): Not on file   Lack of Transportation (Non-Medical): Not on file  Physical Activity:    Days of Exercise per Week: Not on file   Minutes of Exercise per Session: Not on file  Stress:    Feeling of Stress : Not on file  Social Connections:    Frequency of Communication with Friends and Family: Not on file   Frequency of Social  Gatherings with Friends and Family: Not on file   Attends Religious Services: Not on file   Active Member of Clubs or Organizations: Not on file   Attends Archivist Meetings: Not on file   Marital Status: Not on file  Intimate Partner Violence:    Fear of Current or Ex-Partner: Not on file   Emotionally Abused: Not on file   Physically Abused: Not on file   Sexually Abused: Not on file   Family Status  Relation Name Status   Mother  Deceased at age 93   Father  Deceased at age 86   Mat Aunt  (Not Specified)   Administrator  (Not Specified)   Psychiatrist  (Not Specified)   Other  (Not Specified)   Family History  Problem Relation Age of Onset   Diabetes Mother    Hypertension Mother    Stroke Mother    Diabetes Father    Coronary artery disease Father    Breast cancer Maternal Aunt    Lung cancer Maternal Uncle    Prostate cancer Paternal Uncle    Uterine cancer Other    No Known Allergies     Medications: Outpatient Medications Prior to Visit  Medication Sig   atorvastatin (LIPITOR) 20 MG tablet TAKE 1 TABLET EVERY DAY   Blood Glucose Monitoring Suppl (ONE TOUCH ULTRA 2) w/Device KIT Use as directed to check blood sugar once daily   Cholecalciferol (VITAMIN D3) 125 MCG (5000 UT) CAPS TAKE 1 CAPSULE EVERY DAY   Ferrous Sulfate (IRON) 325 (65 Fe) MG TABS Take 2 tablets by mouth daily.   furosemide (LASIX) 20 MG tablet Take 1 tablet (20 mg total) by mouth daily.   glucose blood (ONE TOUCH ULTRA TEST) test strip Use as instructed to check blood glucose daily   lansoprazole (PREVACID) 30 MG capsule TAKE 1 CAPSULE EVERY DAY   levothyroxine (SYNTHROID) 100 MCG tablet Take 1 tablet (100 mcg total) by mouth daily.   lisinopril (ZESTRIL) 20 MG tablet TAKE 1 TABLET EVERY DAY   meloxicam (MOBIC) 15 MG tablet Take 1 tablet (15 mg total) by mouth daily.   metFORMIN (GLUCOPHAGE-XR) 500 MG 24 hr tablet TAKE 2 TABLETS (1,000 MG TOTAL) EVERY  EVENING   Multiple Vitamins-Minerals (ONE-A-DAY WOMENS 50 PLUS PO) Take 1 tablet by mouth daily.   ONETOUCH DELICA LANCETS 40J MISC Use as directed to check blood glucose daily   polyethylene glycol powder (GLYCOLAX/MIRALAX) 17 GM/SCOOP powder Please use one cup of miralax daily.   potassium chloride (KLOR-CON) 10 MEQ tablet TAKE 1 TABLET EVERY DAY   Semaglutide,0.25 or 0.5MG /DOS, (OZEMPIC, 0.25 OR 0.5 MG/DOSE,) 2 MG/1.5ML SOPN Inject 0.25 mg into the skin once a week.   SPIRIVA HANDIHALER 18 MCG inhalation capsule INHALE THE CONTENTS OF 1 CAPSULE EVERY DAY  VIA  HANDIHALER   SYMBICORT 160-4.5 MCG/ACT inhaler INHALE 2 PUFFS TWICE DAILY   vitamin B-12 (CYANOCOBALAMIN) 1000 MCG tablet Take 1,000 mcg by mouth daily.   vitamin C (ASCORBIC ACID) 500 MG tablet Take 500 mg by mouth daily.   [DISCONTINUED] fluticasone (FLONASE) 50 MCG/ACT nasal spray Place 2 sprays into both nostrils daily.   No facility-administered medications prior to visit.    Review of Systems  Constitutional: Negative.  Negative for chills, fatigue and fever.  HENT: Positive for ear pain, rhinorrhea and sneezing. Negative for congestion, ear discharge, hearing loss, nosebleeds, postnasal drip, sinus pressure, sinus pain and sore throat.   Respiratory: Negative.  Negative for cough.   Cardiovascular: Negative.   Gastrointestinal: Negative for abdominal pain, diarrhea and vomiting.  Genitourinary: Negative.   Musculoskeletal: Negative.  Negative for neck pain.  Skin: Negative for rash.  Neurological: Negative for dizziness, light-headedness and headaches.    Last CBC Lab Results  Component Value Date   WBC 8.9 04/12/2020   HGB 10.9 (L) 04/12/2020   HCT 32.9 (L) 04/12/2020   MCV 92 04/12/2020   MCH 30.4 04/12/2020   RDW 11.6 (L) 04/12/2020   PLT 328 92/42/6834   Last metabolic panel Lab Results  Component Value Date   GLUCOSE 220 (H) 04/12/2020   NA 133 (L) 04/12/2020   K 4.6 04/12/2020   CL 94 (L)  04/12/2020   CO2 21 04/12/2020   BUN 19 04/12/2020   CREATININE 0.72 04/12/2020   GFRNONAA 89 04/12/2020   GFRAA 103 04/12/2020   CALCIUM 10.0 04/12/2020   PROT 6.7 04/12/2020   ALBUMIN 4.3 04/12/2020   LABGLOB 2.4 04/12/2020   AGRATIO 1.8 04/12/2020   BILITOT 0.4 04/12/2020   ALKPHOS 172 (H) 04/12/2020   AST 14 04/12/2020   ALT 17 04/12/2020   ANIONGAP 12 02/13/2019      Objective    BP (!) 158/79    Pulse 71    Temp 98 F (36.7 C) (Oral)    Resp 16    Wt 216 lb (98 kg)    LMP  (LMP Unknown)    BMI 34.86 kg/m  BP Readings from Last 3 Encounters:  05/20/20 (!) 158/79  04/12/20 (!) 148/86  02/26/20 122/82   Wt Readings from Last 3 Encounters:  05/20/20 216 lb (98 kg)  04/12/20 211 lb (95.7 kg)  02/26/20 225 lb (102.1 kg)      Physical Exam Vitals reviewed.  Constitutional:      General: She is not in acute distress.    Appearance: Normal appearance. She is obese. She is not ill-appearing, toxic-appearing or diaphoretic.  HENT:     Head: Normocephalic and atraumatic.     Jaw: There is normal jaw occlusion.     Right Ear: Ear canal and external ear normal. A middle ear effusion (clear bilateral fluid behind intact tympanic membranes. ) is present. There is no impacted cerumen. No mastoid tenderness. Tympanic membrane is not perforated, erythematous, retracted or bulging.     Left Ear: Swelling and tenderness (with tragal pull tenderness. ) present. A middle ear effusion is present. There is no impacted cerumen. No mastoid tenderness. Tympanic membrane is not perforated, erythematous, retracted or bulging.     Ears:     Comments: Wearing hearing aids now.     Nose: Rhinorrhea present. No congestion.     Mouth/Throat:     Mouth: Mucous membranes are moist.     Pharynx: No oropharyngeal exudate or posterior oropharyngeal erythema.  Comments:  Cobblestoning posterior pharynx; bilateral allergic shiners; bilateral TMs air fluid level clear; bilateral nasal turbinates  mild edema erythema clear discharge;    Eyes:     General: No scleral icterus.    Extraocular Movements: Extraocular movements intact.     Conjunctiva/sclera: Conjunctivae normal.     Pupils: Pupils are equal, round, and reactive to light.  Cardiovascular:     Rate and Rhythm: Normal rate and regular rhythm.     Pulses: Normal pulses.     Heart sounds: Normal heart sounds. No murmur heard.  No friction rub. No gallop.   Pulmonary:     Effort: Pulmonary effort is normal. No respiratory distress.     Breath sounds: Normal breath sounds. No stridor. No wheezing, rhonchi or rales.  Chest:     Chest wall: No tenderness.  Abdominal:     General: There is no distension.     Palpations: Abdomen is soft.     Tenderness: There is no abdominal tenderness.  Musculoskeletal:     Cervical back: Normal range of motion and neck supple. No rigidity or tenderness.  Lymphadenopathy:     Cervical: No cervical adenopathy.  Skin:    Capillary Refill: Capillary refill takes less than 2 seconds.  Neurological:     General: No focal deficit present.     Mental Status: She is alert.     Motor: No weakness.     Gait: Gait normal.  Psychiatric:        Mood and Affect: Mood normal.        Behavior: Behavior normal.        Thought Content: Thought content normal.        Judgment: Judgment normal.      No results found for any visits on 05/20/20.  Assessment & Plan     Acute otitis externa of left ear, unspecified type - Plan: neomycin-polymyxin-hydrocortisone (CORTISPORIN) OTIC solution, fluticasone (FLONASE) 50 MCG/ACT nasal spray  Allergic rhinitis due to other allergic trigger, unspecified seasonality - Plan: neomycin-polymyxin-hydrocortisone (CORTISPORIN) OTIC solution, fluticasone (FLONASE) 50 MCG/ACT nasal spray  Sneezing with watery eyes   Meds ordered this encounter  Medications   neomycin-polymyxin-hydrocortisone (CORTISPORIN) OTIC solution    Sig: Place 3 drops into the left ear 3  (three) times daily.    Dispense:  10 mL    Refill:  0   fluticasone (FLONASE) 50 MCG/ACT nasal spray    Sig: Place 2 sprays into both nostrils daily.    Dispense:  16 g    Refill:  6   cetirizine (ZYRTEC) 10 MG tablet    Sig: Take 1 tablet (10 mg total) by mouth daily.    Dispense:  90 tablet    Refill:  2   Ok to start Claritin daily as needed seasonally.  Keep hearing aid out of left ear for 1- 2 weeks, discussed cleaning of hearing aid.   Return if any symptoms worsening or changing.    Red Flags discussed. The patient was given clear instructions to go to ER or return to medical center if any red flags develop, symptoms do not improve, worsen or new problems develop. They verbalized understanding.  Return in about 3 days (around 05/23/2020), or if symptoms worsen or fail to improve, for at any time for any worsening symptoms, Go to Emergency room/ urgent care if worse.        Marcille Buffy, [place] [phone] (phone) [phone] (fax)  [facility]

## 2020-05-24 ENCOUNTER — Other Ambulatory Visit: Payer: Self-pay | Admitting: Family Medicine

## 2020-05-24 MED ORDER — LISINOPRIL 20 MG PO TABS
20.0000 mg | ORAL_TABLET | Freq: Every day | ORAL | 1 refills | Status: DC
Start: 2020-05-24 — End: 2020-10-12

## 2020-05-24 NOTE — Telephone Encounter (Signed)
Medication Refill - Medication: furosemide (LASIX) 20 MG tablet,lisinopril (ZESTRIL) 20 MG tablet    Has the patient contacted their pharmacy? yes (Agent: If no, request that the patient contact the pharmacy for the refill.) (Agent: If yes, when and what did the pharmacy advise?)Contact PCP  Preferred Pharmacy (with phone number or street name):  [facility] - [place], [place] - [address] Phone:  [phone]  Fax:  [phone]       Agent: Please be advised that RX refills may take up to 3 business days. We ask that you follow-up with your pharmacy.

## 2020-05-24 NOTE — Telephone Encounter (Signed)
Emma Fletcher, sister on [place] called and advised the patient called in for refills and Furosemide was sent to the pharmacy on 03/22/20 90/1 refill. She says she will check because she should already have it.

## 2020-05-26 ENCOUNTER — Ambulatory Visit: Payer: Medicare Other | Admitting: Physician Assistant

## 2020-05-27 NOTE — Progress Notes (Signed)
Subjective:   Emma Fletcher is a 64 y.o. female who presents for Medicare Annual (Subsequent) preventive examination.  I connected with Emma Fletcher today by telephone and verified that I am speaking with the correct person using two identifiers. Location patient: home Location provider: work Persons participating in the virtual visit: patient, provider.   I discussed the limitations, risks, security and privacy concerns of performing an evaluation and management service by telephone and the availability of in person appointments. I also discussed with the patient that there may be a patient responsible charge related to this service. The patient expressed understanding and verbally consented to this telephonic visit.    Interactive audio and video telecommunications were attempted between this provider and patient, however failed, due to patient having technical difficulties OR patient did not have access to video capability.  We continued and completed visit with audio only.   Review of Systems    N/A  Cardiac Risk Factors include: advanced age (>80mn, >>63women);diabetes mellitus;dyslipidemia;obesity (BMI >30kg/m2);hypertension     Objective:    There were no vitals filed for this visit. There is no height or weight on file to calculate BMI.  Advanced Directives 05/31/2020 03/31/2019 07/02/2018 04/24/2018 10/19/2017 06/30/2017 02/23/2017  Does Patient Have a Medical Advance Directive? Yes Yes Yes Yes No Yes Yes  Type of AParamedicof APeruLiving will H[place]Living will - H[place]ardoLiving will - Healthcare Power of [place]Living will  Does patient want to make changes to medical advance directive? - - No - Patient declined - - No - Patient declined -  Copy of [place] Chart? Yes - validated most recent copy scanned in chart (See row information) Yes - validated  most recent copy scanned in chart (See row information) - - - No - copy requested No - copy requested  Would patient like information on creating a medical advance directive? - - - - No - Patient declined - -    Current Medications (verified) Outpatient Encounter Medications as of 05/31/2020  Medication Sig  . atorvastatin (LIPITOR) 20 MG tablet TAKE 1 TABLET EVERY DAY  . Blood Glucose Monitoring Suppl (ONE TOUCH ULTRA 2) w/Device KIT Use as directed to check blood sugar once daily  . cetirizine (ZYRTEC) 10 MG tablet Take 1 tablet (10 mg total) by mouth daily.  . Cholecalciferol (VITAMIN D3) 125 MCG (5000 UT) CAPS TAKE 1 CAPSULE EVERY DAY  . Ferrous Sulfate (IRON) 325 (65 Fe) MG TABS Take 2 tablets by mouth daily.  . fluticasone (FLONASE) 50 MCG/ACT nasal spray Place 2 sprays into both nostrils daily.  . furosemide (LASIX) 20 MG tablet Take 1 tablet (20 mg total) by mouth daily.  .Marland Kitchenglucose blood (ONE TOUCH ULTRA TEST) test strip Use as instructed to check blood glucose daily  . lansoprazole (PREVACID) 30 MG capsule TAKE 1 CAPSULE EVERY DAY  . levothyroxine (SYNTHROID) 100 MCG tablet Take 1 tablet (100 mcg total) by mouth daily.  .Marland Kitchenlisinopril (ZESTRIL) 20 MG tablet Take 1 tablet (20 mg total) by mouth daily.  . meloxicam (MOBIC) 15 MG tablet Take 1 tablet (15 mg total) by mouth daily.  . metFORMIN (GLUCOPHAGE-XR) 500 MG 24 hr tablet TAKE 2 TABLETS (1,000 MG TOTAL) EVERY EVENING  . Multiple Vitamins-Minerals (ONE-A-DAY WOMENS 50 PLUS PO) Take 1 tablet by mouth daily.  .Glory RosebushDELICA LANCETS 370YMISC Use as directed to check blood glucose daily  .  polyethylene glycol powder (GLYCOLAX/MIRALAX) 17 GM/SCOOP powder Please use one cup of miralax daily.  . potassium chloride (KLOR-CON) 10 MEQ tablet TAKE 1 TABLET EVERY DAY  . Semaglutide,0.25 or 0.5MG/DOS, (OZEMPIC, 0.25 OR 0.5 MG/DOSE,) 2 MG/1.5ML SOPN Inject 0.25 mg into the skin once a week.  . SPIRIVA HANDIHALER 18 MCG inhalation capsule  INHALE THE CONTENTS OF 1 CAPSULE EVERY DAY  VIA  HANDIHALER  . SYMBICORT 160-4.5 MCG/ACT inhaler INHALE 2 PUFFS TWICE DAILY  . vitamin B-12 (CYANOCOBALAMIN) 1000 MCG tablet Take 1,000 mcg by mouth daily.  . vitamin C (ASCORBIC ACID) 500 MG tablet Take 500 mg by mouth daily.  Marland Kitchen neomycin-polymyxin-hydrocortisone (CORTISPORIN) OTIC solution Place 3 drops into the left ear 3 (three) times daily. (Patient not taking: Reported on 05/31/2020)   No facility-administered encounter medications on file as of 05/31/2020.    Allergies (verified) Patient has no known allergies.   History: Past Medical History:  Diagnosis Date  . Anemia   . Bronchitis   . Cardiomyopathy secondary   . Developmental delay   . Diabetes mellitus without complication ([place])   . Edema   . Hypercholesterolemia   . Unspecified essential hypertension    Past Surgical History:  Procedure Laterality Date  . COLONOSCOPY    . COLONOSCOPY WITH PROPOFOL N/A 04/24/2018   Procedure: COLONOSCOPY WITH PROPOFOL;  Surgeon: Jonathon Bellows, MD;  Location: [facility] ENDOSCOPY;  Service: Gastroenterology;  Laterality: N/A;  . none     Family History  Problem Relation Age of Onset  . Diabetes Mother   . Hypertension Mother   . Stroke Mother   . Diabetes Father   . Coronary artery disease Father   . Breast cancer Maternal Aunt   . Lung cancer Maternal Uncle   . Prostate cancer Paternal Uncle   . Uterine cancer Other    Social History   Socioeconomic History  . Marital status: Single    Spouse name: Not on file  . Number of children: 0  . Years of education: Not on file  . Highest education level: High school graduate  Occupational History  . Occupation: Part time works in [employer] Use  . Smoking status: Never Smoker  . Smokeless tobacco: Never Used  Vaping Use  . Vaping Use: Never used  Substance and Sexual Activity  . Alcohol use: No    Alcohol/week: 0.0 standard drinks  . Drug use: No  . Sexual activity: Not  on file  Other Topics Concern  . Not on file  Social History Narrative  . Not on file   Social Determinants of Health   Financial Resource Strain: Low Risk   . Difficulty of Paying Living Expenses: Not hard at all  Food Insecurity: No Food Insecurity  . Worried About Charity fundraiser in the Last Year: Never true  . Ran Out of Food in the Last Year: Never true  Transportation Needs: No Transportation Needs  . Lack of Transportation (Medical): No  . Lack of Transportation (Non-Medical): No  Physical Activity: Insufficiently Active  . Days of Exercise per Week: 5 days  . Minutes of Exercise per Session: 10 min  Stress: No Stress Concern Present  . Feeling of Stress : Not at all  Social Connections: Moderately Isolated  . Frequency of Communication with Friends and Family: More than three times a week  . Frequency of Social Gatherings with Friends and Family: More than three times a week  . Attends Religious Services: More than 4 times  per year  . Active Member of Clubs or Organizations: No  . Attends Archivist Meetings: Never  . Marital Status: Never married    Tobacco Counseling Counseling given: Not Answered   Clinical Intake:  Pre-visit preparation completed: Yes  Pain : No/denies pain     Nutritional Risks: None Diabetes: Yes  How often do you need to have someone help you when you read instructions, pamphlets, or other written materials from your doctor or pharmacy?: 1 - Never  Diabetic? Yes  Nutrition Risk Assessment:  Has the patient had any N/V/D within the last 2 months?  No  Does the patient have any non-healing wounds?  No  Has the patient had any unintentional weight loss or weight gain?  No   Diabetes:  Is the patient diabetic?  Yes  If diabetic, was a CBG obtained today?  No  Did the patient bring in their glucometer from home?  No  How often do you monitor your CBG's? One to two times daily.   Financial Strains and Diabetes  Management:  Are you having any financial strains with the device, your supplies or your medication? No .  Does the patient want to be seen by Chronic Care Management for management of their diabetes?  No  Would the patient like to be referred to a Nutritionist or for Diabetic Management?  No   Diabetic Exams:  Diabetic Eye Exam: Overdue for diabetic eye exam. Pt has been advised about the importance in completing this exam. Patient has an eye exam scheduled for 06/2020. Diabetic Foot Exam: Overdue, Pt has been advised about the importance in completing this exam. Note made to follow up on this at next in office apt.    Interpreter Needed?: No  Information entered by :: [facility], LPN   Activities of Daily Living In your present state of health, do you have any difficulty performing the following activities: 05/31/2020 04/12/2020  Hearing? N Y  Comment Has hearing aids in both ears. -  Vision? N Y  Difficulty concentrating or making decisions? N N  Walking or climbing stairs? N N  Dressing or bathing? N N  Doing errands, shopping? Y N  Comment Does not drive. -  Preparing Food and eating ? N -  Using the Toilet? N -  In the past six months, have you accidently leaked urine? N -  Do you have problems with loss of bowel control? N -  Managing your Medications? N -  Managing your Finances? N -  Housekeeping or managing your Housekeeping? N -  Some recent data might be hidden    Patient Care Team: Bacigalupo, Dionne Bucy, MD as PCP - General (Family Medicine) Gardiner Barefoot, DPM as Consulting Physician (Podiatry) Virgel Manifold, MD as Consulting Physician (Gastroenterology) [place], [place] (Optometry)  Indicate any recent Medical Services you may have received from other than [facility] providers in the past year (date may be approximate).     Assessment:   This is a routine wellness examination for Emma Fletcher.  Hearing/Vision screen No exam data present  Dietary issues  and exercise activities discussed: Current Exercise Habits: Home exercise routine, Type of exercise: walking, Time (Minutes): 10, Frequency (Times/Week): 5, Weekly Exercise (Minutes/Week): 50, Intensity: Mild, Exercise limited by: None identified  Goals    . DIET - INCREASE WATER INTAKE     Recommend to drink at least 6-8 8oz glasses of water per day.    . Prevent falls  Recommend to remove any items from the home that may cause slips or trips.      Depression Screen PHQ 2/9 Scores 04/12/2020 03/31/2019 03/26/2018 03/26/2018 09/20/2017  PHQ - 2 Score 0 0 0 0 0  PHQ- 9 Score 0 - 0 - -    Fall Risk Fall Risk  05/31/2020 04/12/2020 03/31/2019 03/26/2018 09/20/2017  Falls in the past year? 1 1 0 Yes Yes  Number falls in past yr: 0 0 0 1 1  Injury with Fall? 0 0 0 - Yes  Follow up Falls prevention discussed Falls evaluation completed - - Falls evaluation completed    [place]:  Any stairs in or around the home? Yes  If so, are there any without handrails? No  Home free of loose throw rugs in walkways, pet beds, electrical cords, etc? Yes  Adequate lighting in your home to reduce risk of falls? Yes   ASSISTIVE DEVICES UTILIZED TO PREVENT FALLS:  Life alert? No  Use of a cane, walker or w/c? No  Grab bars in the bathroom? Yes  Shower chair or bench in shower? Yes  Elevated toilet seat or a handicapped toilet? Yes    Cognitive Function:     6CIT Screen 05/31/2020 03/31/2019  What Year? 0 points 0 points  What month? 3 points 0 points  What time? 0 points 0 points  Count back from 20 0 points 0 points  Months in reverse 4 points 0 points  Repeat phrase 4 points 2 points  Total Score 11 2    Immunizations Immunization History  Administered Date(s) Administered  . Influenza,inj,Quad PF,6+ Mos 03/26/2018  . Influenza-Unspecified 03/30/2019, 03/19/2020  . Pneumococcal Polysaccharide-23 03/26/2018  . Tdap 10/20/2010, 03/30/2019    TDAP  status: Up to date  Flu Vaccine status: Up to date  Covid-19 vaccine status: Completed vaccines  Qualifies for Shingles Vaccine? Yes   Zostavax completed No   Shingrix Completed?: No.    Education has been provided regarding the importance of this vaccine. Patient has been advised to call insurance company to determine out of pocket expense if they have not yet received this vaccine. Advised may also receive vaccine at local pharmacy or Health Dept. Verbalized acceptance and understanding.  Screening Tests Health Maintenance  Topic Date Due  . COVID-19 Vaccine (1) Never done  . OPHTHALMOLOGY EXAM  01/24/2019  . FOOT EXAM  04/27/2020  . HEMOGLOBIN A1C  10/11/2020  . PAP SMEAR-Modifier  03/26/2021  . MAMMOGRAM  11/18/2021  . COLONOSCOPY  04/24/2028  . TETANUS/TDAP  03/29/2029  . INFLUENZA VACCINE  Completed  . PNEUMOCOCCAL POLYSACCHARIDE VACCINE AGE 78-64 HIGH RISK  Completed  . Hepatitis C Screening  Completed  . HIV Screening  Completed    Health Maintenance  Health Maintenance Due  Topic Date Due  . COVID-19 Vaccine (1) Never done  . OPHTHALMOLOGY EXAM  01/24/2019  . FOOT EXAM  04/27/2020    Colorectal cancer screening: Type of screening: Colonoscopy. Completed 04/24/18. Repeat every 10 years  Mammogram status: Completed 11/19/19. Repeat every year  Lung Cancer Screening: (Low Dose CT Chest recommended if Age 15-80 years, 30 pack-year currently smoking OR have quit w/in 15years.) does not qualify.    Additional Screening:  Hepatitis C Screening: Up to date  Vision Screening: Recommended annual ophthalmology exams for early detection of glaucoma and other disorders of the eye. Is the patient up to date with their annual eye exam?  Yes  Who is  the provider or what is the name of the office in which the patient attends annual eye exams? [facility] If pt is not established with a provider, would they like to be referred to a provider to establish care? No .    Dental Screening: Recommended annual dental exams for proper oral hygiene  Community Resource Referral / Chronic Care Management: CRR required this visit?  No   CCM required this visit?  No      Plan:     I have personally reviewed and noted the following in the patient's chart:   . Medical and social history . Use of alcohol, tobacco or illicit drugs  . Current medications and supplements . Functional ability and status . Nutritional status . Physical activity . Advanced directives . List of other physicians . Hospitalizations, surgeries, and ER visits in previous 12 months . Vitals . Screenings to include cognitive, depression, and falls . Referrals and appointments  In addition, I have reviewed and discussed with patient certain preventive protocols, quality metrics, and best practice recommendations. A written personalized care plan for preventive services as well as general preventive health recommendations were provided to patient.     Dezhane Staten [place], [place]   [id number]   Nurse Notes: Pt needs a diabetic foot exam at next in office apt. Eye exam is scheduled for 06/2020 @ [facility]. Pt has received her Covid vaccines. Requested vaccine information on MyChart or in person.

## 2020-05-31 ENCOUNTER — Other Ambulatory Visit: Payer: Self-pay

## 2020-05-31 ENCOUNTER — Ambulatory Visit (INDEPENDENT_AMBULATORY_CARE_PROVIDER_SITE_OTHER): Payer: Medicare Other

## 2020-05-31 ENCOUNTER — Telehealth: Payer: Self-pay

## 2020-05-31 DIAGNOSIS — Z0000 Encounter for general adult medical examination without abnormal findings: Secondary | ICD-10-CM | POA: Diagnosis not present

## 2020-05-31 NOTE — Telephone Encounter (Signed)
Mrs. June advised as below. She will call back to schedule appt.

## 2020-05-31 NOTE — Telephone Encounter (Signed)
Spoke with pt today and completed her telephonic AWV. Pt inquired about her 3 month f/u after starting Ozempic. Apt has to be made with June (her sister). Called June and she would like to know if this is a lab only apt or does she need to be seen too? Pt already has a 6 month f/u for 10/11/20. Pt started her Ozempic injections 05/02/20 so her 3 month recheck would be in February 2022. Please advise on apt type. Call back June @ [phone]. Thanks!

## 2020-05-31 NOTE — Telephone Encounter (Signed)
Yes, we do typically see them in person to f/u.  We will likely move the 26m f/u out a bit more after seeing her

## 2020-05-31 NOTE — Patient Instructions (Signed)
Emma Fletcher , Thank you for taking time to come for your Medicare Wellness Visit. I appreciate your ongoing commitment to your health goals. Please review the following plan we discussed and let me know if I can assist you in the future.   Screening recommendations/referrals: Colonoscopy: Up to date, due 04/2028 Mammogram: Up to date, due 11/2020 Recommended yearly ophthalmology/optometry visit for glaucoma screening and checkup Recommended yearly dental visit for hygiene and checkup  Vaccinations: Influenza vaccine: Done 03/2020 Tdap vaccine: Up to date, due 03/2029 Shingles vaccine: Shingrix discussed. Please contact your pharmacy for coverage information.     Advanced directives: Currently on file.  Conditions/risks identified: Fall risk preventatives discussed today. Recommend to drink at least 6-8 8oz glasses of water per day.  Next appointment: 10/11/20 @ 8:00 AM with Dr Brita Romp   Preventive Care 40-64 Years, Female Preventive care refers to lifestyle choices and visits with your health care provider that can promote health and wellness. What does preventive care include?  A yearly physical exam. This is also called an annual well check.  Dental exams once or twice a year.  Routine eye exams. Ask your health care provider how often you should have your eyes checked.  Personal lifestyle choices, including:  Daily care of your teeth and gums.  Regular physical activity.  Eating a healthy diet.  Avoiding tobacco and drug use.  Limiting alcohol use.  Practicing safe sex.  Taking low-dose aspirin daily starting at age 12.  Taking vitamin and mineral supplements as recommended by your health care provider. What happens during an annual well check? The services and screenings done by your health care provider during your annual well check will depend on your age, overall health, lifestyle risk factors, and family history of disease. Counseling  Your health care  provider may ask you questions about your:  Alcohol use.  Tobacco use.  Drug use.  Emotional well-being.  Home and relationship well-being.  Sexual activity.  Eating habits.  Work and work Statistician.  Method of birth control.  Menstrual cycle.  Pregnancy history. Screening  You may have the following tests or measurements:  Height, weight, and BMI.  Blood pressure.  Lipid and cholesterol levels. These may be checked every 5 years, or more frequently if you are over 45 years old.  Skin check.  Lung cancer screening. You may have this screening every year starting at age 59 if you have a 30-pack-year history of smoking and currently smoke or have quit within the past 15 years.  Fecal occult blood test (FOBT) of the stool. You may have this test every year starting at age 63.  Flexible sigmoidoscopy or colonoscopy. You may have a sigmoidoscopy every 5 years or a colonoscopy every 10 years starting at age 46.  Hepatitis C blood test.  Hepatitis B blood test.  Sexually transmitted disease (STD) testing.  Diabetes screening. This is done by checking your blood sugar (glucose) after you have not eaten for a while (fasting). You may have this done every 1-3 years.  Mammogram. This may be done every 1-2 years. Talk to your health care provider about when you should start having regular mammograms. This may depend on whether you have a family history of breast cancer.  BRCA-related cancer screening. This may be done if you have a family history of breast, ovarian, tubal, or peritoneal cancers.  Pelvic exam and Pap test. This may be done every 3 years starting at age 64. Starting at age 30, this 62  be done every 5 years if you have a Pap test in combination with an HPV test.  Bone density scan. This is done to screen for osteoporosis. You may have this scan if you are at high risk for osteoporosis. Discuss your test results, treatment options, and if necessary, the need  for more tests with your health care provider. Vaccines  Your health care provider may recommend certain vaccines, such as:  Influenza vaccine. This is recommended every year.  Tetanus, diphtheria, and acellular pertussis (Tdap, Td) vaccine. You may need a Td booster every 10 years.  Zoster vaccine. You may need this after age 33.  Pneumococcal 13-valent conjugate (PCV13) vaccine. You may need this if you have certain conditions and were not previously vaccinated.  Pneumococcal polysaccharide (PPSV23) vaccine. You may need one or two doses if you smoke cigarettes or if you have certain conditions. Talk to your health care provider about which screenings and vaccines you need and how often you need them. This information is not intended to replace advice given to you by your health care provider. Make sure you discuss any questions you have with your health care provider. Document Released: 07/02/2015 Document Revised: 02/23/2016 Document Reviewed: 04/06/2015 Elsevier Interactive Patient Education  2017 [place] Prevention in the Home Falls can cause injuries. They can happen to people of all ages. There are many things you can do to make your home safe and to help prevent falls. What can I do on the outside of my home?  Regularly fix the edges of walkways and driveways and fix any cracks.  Remove anything that might make you trip as you walk through a door, such as a raised step or threshold.  Trim any bushes or trees on the path to your home.  Use bright outdoor lighting.  Clear any walking paths of anything that might make someone trip, such as rocks or tools.  Regularly check to see if handrails are loose or broken. Make sure that both sides of any steps have handrails.  Any raised decks and porches should have guardrails on the edges.  Have any leaves, snow, or ice cleared regularly.  Use sand or salt on walking paths during winter.  Clean up any spills in  your garage right away. This includes oil or grease spills. What can I do in the bathroom?  Use night lights.  Install grab bars by the toilet and in the tub and shower. Do not use towel bars as grab bars.  Use non-skid mats or decals in the tub or shower.  If you need to sit down in the shower, use a plastic, non-slip stool.  Keep the floor dry. Clean up any water that spills on the floor as soon as it happens.  Remove soap buildup in the tub or shower regularly.  Attach bath mats securely with double-sided non-slip rug tape.  Do not have throw rugs and other things on the floor that can make you trip. What can I do in the bedroom?  Use night lights.  Make sure that you have a light by your bed that is easy to reach.  Do not use any sheets or blankets that are too big for your bed. They should not hang down onto the floor.  Have a firm chair that has side arms. You can use this for support while you get dressed.  Do not have throw rugs and other things on the floor that can make you trip.  What can I do in the kitchen?  Clean up any spills right away.  Avoid walking on wet floors.  Keep items that you use a lot in easy-to-reach places.  If you need to reach something above you, use a strong step stool that has a grab bar.  Keep electrical cords out of the way.  Do not use floor polish or wax that makes floors slippery. If you must use wax, use non-skid floor wax.  Do not have throw rugs and other things on the floor that can make you trip. What can I do with my stairs?  Do not leave any items on the stairs.  Make sure that there are handrails on both sides of the stairs and use them. Fix handrails that are broken or loose. Make sure that handrails are as long as the stairways.  Check any carpeting to make sure that it is firmly attached to the stairs. Fix any carpet that is loose or worn.  Avoid having throw rugs at the top or bottom of the stairs. If you do have  throw rugs, attach them to the floor with carpet tape.  Make sure that you have a light switch at the top of the stairs and the bottom of the stairs. If you do not have them, ask someone to add them for you. What else can I do to help prevent falls?  Wear shoes that:  Do not have high heels.  Have rubber bottoms.  Are comfortable and fit you well.  Are closed at the toe. Do not wear sandals.  If you use a stepladder:  Make sure that it is fully opened. Do not climb a closed stepladder.  Make sure that both sides of the stepladder are locked into place.  Ask someone to hold it for you, if possible.  Clearly mark and make sure that you can see:  Any grab bars or handrails.  First and last steps.  Where the edge of each step is.  Use tools that help you move around (mobility aids) if they are needed. These include:  Canes.  Walkers.  Scooters.  Crutches.  Turn on the lights when you go into a dark area. Replace any light bulbs as soon as they burn out.  Set up your furniture so you have a clear path. Avoid moving your furniture around.  If any of your floors are uneven, fix them.  If there are any pets around you, be aware of where they are.  Review your medicines with your doctor. Some medicines can make you feel dizzy. This can increase your chance of falling. Ask your doctor what other things that you can do to help prevent falls. This information is not intended to replace advice given to you by your health care provider. Make sure you discuss any questions you have with your health care provider. Document Released: 04/01/2009 Document Revised: 11/11/2015 Document Reviewed: 07/10/2014 Elsevier Interactive Patient Education  2017 [employer].

## 2020-06-07 ENCOUNTER — Encounter: Payer: Self-pay | Admitting: Podiatry

## 2020-06-07 ENCOUNTER — Other Ambulatory Visit: Payer: Self-pay

## 2020-06-07 ENCOUNTER — Ambulatory Visit (INDEPENDENT_AMBULATORY_CARE_PROVIDER_SITE_OTHER): Payer: Medicare Other | Admitting: Podiatry

## 2020-06-07 DIAGNOSIS — B351 Tinea unguium: Secondary | ICD-10-CM | POA: Diagnosis not present

## 2020-06-07 DIAGNOSIS — M79676 Pain in unspecified toe(s): Secondary | ICD-10-CM

## 2020-06-07 DIAGNOSIS — E119 Type 2 diabetes mellitus without complications: Secondary | ICD-10-CM

## 2020-06-07 DIAGNOSIS — L600 Ingrowing nail: Secondary | ICD-10-CM | POA: Diagnosis not present

## 2020-06-07 DIAGNOSIS — N182 Chronic kidney disease, stage 2 (mild): Secondary | ICD-10-CM

## 2020-06-07 NOTE — Progress Notes (Signed)
This patient returns to my office for at risk foot care.  This patient requires this care by a professional since this patient will be at risk due to having diabetes and chronic kidney disease.  This patient is unable to cut nails herself since the patient cannot reach her nails.These nails are painful walking and wearing shoes. Her left foot is pain free. This patient presents for at risk foot care today.    General Appearance  Alert, conversant and in no acute stress.  Vascular  Dorsalis pedis and posterior tibial  pulses are palpable  bilaterally.  Capillary return is within normal limits  bilaterally. Temperature is within normal limits  bilaterally.  Neurologic  Senn-Weinstein monofilament wire test within normal limits  bilaterally. Muscle power within normal limits bilaterally.  Nails Thick disfigured discolored nails with subungual debris  from hallux to fifth toes bilaterally. No evidence of bacterial infection or drainage bilaterally.  Orthopedic  No limitations of motion  feet .  No crepitus or effusions noted.  No bony pathology or digital deformities noted.  Skin  normotropic skin with no porokeratosis noted bilaterally.  No signs of infections or ulcers noted.     Onychomycosis  Pain in right toes  Pain in left toes  Consent was obtained for treatment procedures.   Mechanical debridement of nails 1-5  bilaterally performed with a nail nipper.  Filed with dremel without incident.  Discussed swelling of her ankles with this patient.   Return office visit  3 months                    Told patient to return for periodic foot care and evaluation due to potential at risk complications.   Helane Gunther DPM

## 2020-06-27 DIAGNOSIS — U071 COVID-19: Secondary | ICD-10-CM | POA: Diagnosis not present

## 2020-06-27 DIAGNOSIS — Z20822 Contact with and (suspected) exposure to covid-19: Secondary | ICD-10-CM | POA: Diagnosis not present

## 2020-06-28 ENCOUNTER — Telehealth: Payer: Self-pay | Admitting: Family Medicine

## 2020-06-28 NOTE — Telephone Encounter (Signed)
Pts sister is asking for recommendations for relief from the pts covid symptoms / Pt also stated she was suppose to be called in a cough suppressant and something for her ear/ but nothing was ever sent/ Pt also asked if a  Zpack / please advise

## 2020-06-29 NOTE — Telephone Encounter (Signed)
Pt's sister calling back to follow up on this message sent yesterday. She states the pt is blowing out lots and lots of green stuff and coughing up green.  she states the UC dr sent in the cough med. But she is asking if Dr B will call in a zpak to clear up that infection.  June is requesting a call back to advise.  She is giving pt Musinex All and it is not working. [facility] is the one that did the positive covid

## 2020-06-29 NOTE — Telephone Encounter (Signed)
Please advise 

## 2020-06-29 NOTE — Telephone Encounter (Signed)
Unfortunately, antibody treatment and antivirals are the only treatment that we have for COVID.  These are in extremely short supply and only going to severely immunocompromised individuals at this time.  Zpack and other antibiotics do not treat COVID.  Over the counter medications for symptoms and time are the only thing to do. If gets severe shortness of breath, would need to be seen in person to evaluate oxygen levels.

## 2020-06-30 ENCOUNTER — Ambulatory Visit: Payer: Self-pay | Admitting: *Deleted

## 2020-06-30 NOTE — Telephone Encounter (Signed)
Pt called and I advise her of the note below from Dr. Leonard Schwartz / Pt states she has went to the pharmacy and wanted to know what over the counter Dr. B would suggest and now she is starting to experience wheezing/ please advise asap

## 2020-06-30 NOTE — Telephone Encounter (Signed)
Patient would like to speak with the nurse or doctor regarding getting an antibiotic.  Could not understand completely what the patient wanted but she does want the nurse to call her back to discuss.  CB# [phone]

## 2020-06-30 NOTE — Telephone Encounter (Signed)
Please set her up for the respiratory clinic tonight. She can be seen in person and get x-ray if needed.

## 2020-06-30 NOTE — Telephone Encounter (Signed)
Patient's sister advised as below.  

## 2020-06-30 NOTE — Telephone Encounter (Signed)
Patient advised to use Mucinex, saline nasal spray, and Tylenol for headache. Patient's sister to call back if symptoms are worsening or go to ER.

## 2020-06-30 NOTE — Telephone Encounter (Signed)
Patient is calling to rpeort that she is having some on/off wheezing- she is wanting an appointment with her PCP- advised unable to schedule in person visit due to symptoms. Patient is very upset that she can't have in person OV- advised office to call June to discuss symptoms and visit.  Reason for Disposition . [1] MILD difficulty breathing (e.g., minimal/no SOB at rest, SOB with walking, pulse <100) AND [2] NEW-onset or WORSE than normal  Answer Assessment - Initial Assessment Questions 1. RESPIRATORY STATUS: "Describe your breathing?" (e.g., wheezing, shortness of breath, unable to speak, severe coughing)      Wheezing- off/on 2. ONSET: "When did this breathing problem begin?"      Several days- patient is coughing and has nasal congestion 3. PATTERN "Does the difficult breathing come and go, or has it been constant since it started?"      Comes and goes 4. SEVERITY: "How bad is your breathing?" (e.g., mild, moderate, severe)    - MILD: No SOB at rest, mild SOB with walking, speaks normally in sentences, can lay down, no retractions, pulse < 100.    - MODERATE: SOB at rest, SOB with minimal exertion and prefers to sit, cannot lie down flat, speaks in phrases, mild retractions, audible wheezing, pulse 100-120.    - SEVERE: Very SOB at rest, speaks in single words, struggling to breathe, sitting hunched forward, retractions, pulse > 120      No problem with SOB 5. RECURRENT SYMPTOM: "Have you had difficulty breathing before?" If Yes, ask: "When was the last time?" and "What happened that time?"      Years ago- inhaler/antibiotics 6. CARDIAC HISTORY: "Do you have any history of heart disease?" (e.g., heart attack, angina, bypass surgery, angioplasty)      *No Answer* 7. LUNG HISTORY: "Do you have any history of lung disease?"  (e.g., pulmonary embolus, asthma, emphysema)     *No Answer* 8. CAUSE: "What do you think is causing the breathing problem?"      Possible COVID 9. OTHER SYMPTOMS:  "Do you have any other symptoms? (e.g., dizziness, runny nose, cough, chest pain, fever)     Cough, chest congestion with on/off wheezing 10. PREGNANCY: "Is there any chance you are pregnant?" "When was your last menstrual period?"       n/a 11. TRAVEL: "Have you traveled out of the country in the last month?" (e.g., travel history, exposures)       n/a  Protocols used: BREATHING DIFFICULTY-A-AH

## 2020-06-30 NOTE — Telephone Encounter (Signed)
Patient sister is requesting a callback today at [phone]

## 2020-07-01 NOTE — Telephone Encounter (Signed)
Noted  

## 2020-07-01 NOTE — Telephone Encounter (Signed)
Emma Fletcher reports patient is feeling much better today. She will call back if needed.

## 2020-07-02 ENCOUNTER — Telehealth: Payer: Self-pay

## 2020-07-02 NOTE — Telephone Encounter (Signed)
Copied from CRM [id number]. Topic: General - Other >> Jul 02, 2020 12:01 PM Wyonia Hough E wrote: Reason for CRM: Called returned to nurse about respiratory clinic scheduling and xray/ she would like to do so / she is feeling better but still wants to make sure/ please advise

## 2020-07-02 NOTE — Telephone Encounter (Signed)
Mrs. June advised that respiratory clinic is closed today. Advised to go to ER or UC if needed.

## 2020-07-07 ENCOUNTER — Telehealth: Payer: Self-pay

## 2020-07-07 ENCOUNTER — Other Ambulatory Visit: Payer: Self-pay

## 2020-07-07 NOTE — Telephone Encounter (Signed)
Copied from CRM [id number]. Topic: General - Inquiry >> Jul 07, 2020  8:49 AM Adrian Prince D wrote: Reason for CRM: Patients sister and POA called to let you know that she has been taking mucinex but she is still coughing up mucus. She thinks it may have become pneumonia. She would like for the doctor to listen to her lungs. She can be reached at [phone]. Please advise

## 2020-07-07 NOTE — Telephone Encounter (Signed)
Dr. B is out of the office due to medical issue and only seeing virtual patients. They can do a virtual appt with someone in the office or we can arrange for her to go to the respiratory clinic to be seen in person.   If she is bad enough she should go to Urgent care.

## 2020-07-07 NOTE — Telephone Encounter (Signed)
[facility] faxed refill request for the following medications:  meloxicam (MOBIC) 15 MG tablet   Please advise.

## 2020-07-08 ENCOUNTER — Encounter: Payer: Self-pay | Admitting: Family Medicine

## 2020-07-08 ENCOUNTER — Telehealth (INDEPENDENT_AMBULATORY_CARE_PROVIDER_SITE_OTHER): Payer: Medicare Other | Admitting: Family Medicine

## 2020-07-08 DIAGNOSIS — R058 Other specified cough: Secondary | ICD-10-CM

## 2020-07-08 MED ORDER — MELOXICAM 15 MG PO TABS
15.0000 mg | ORAL_TABLET | Freq: Every day | ORAL | 1 refills | Status: DC
Start: 2020-07-08 — End: 2020-11-01

## 2020-07-08 NOTE — Progress Notes (Signed)
  Virtual telephone visit    Virtual Visit via Telephone Note   This visit type was conducted due to national recommendations for restrictions regarding the COVID-19 Pandemic (e.g. social distancing) in an effort to limit this patient's exposure and mitigate transmission in our community. Due to her co-morbid illnesses, this patient is at least at moderate risk for complications without adequate follow up. This format is felt to be most appropriate for this patient at this time. The patient did not have access to video technology or had technical difficulties with video requiring transitioning to audio format only (telephone). Physical exam was limited to content and character of the telephone converstion.     Patient location: home Provider location: home office Persons involved in the visit: patient, provider   I discussed the limitations of evaluation and management by telemedicine and the availability of in person appointments. The patient expressed understanding and agreed to proceed.   Visit Date: 07/08/2020  Today's healthcare provider: Angela Bacigalupo, MD   Chief Complaint  Patient presents with  . Cough   Subjective    Cough This is a new problem. The current episode started 1 to 4 weeks ago. The problem has been gradually improving. The cough is non-productive. Pertinent negatives include no chills, ear congestion, ear pain, fever, heartburn, hemoptysis, nasal congestion, postnasal drip, sore throat or shortness of breath. Treatments tried: Mucinex  The treatment provided moderate relief.    Had COVID 2 weeks ago. Worried that she could have COVID.  Other symptoms better other than rhinorrhea.  Feels a little winded.  No fever. Cough is no worse.  Wants Z pack.    Patient Active Problem List   Diagnosis Date Noted  . Acute otitis externa of left ear 05/20/2020  . Sneezing with watery eyes 05/20/2020  . Allergic rhinitis 05/20/2020  . Bursitis of right hip  02/26/2020  . Bone pain 09/15/2019  . Chronic obstructive pulmonary disease (HCC) 06/23/2019  . Hyperlipidemia associated with type 2 diabetes mellitus (HCC) 09/20/2017  . Hypothyroidism 09/20/2017  . Dysfunction of both eustachian tubes 09/20/2017  . CKD (chronic kidney disease), stage II 05/16/2016  . T2DM (type 2 diabetes mellitus) (HCC) 12/18/2015  . Hyponatremia 12/18/2015  . Anemia of chronic disease 12/18/2015  . Essential hypertension 11/10/2008  . Secondary cardiomyopathy (HCC) 11/10/2008  . EDEMA 11/10/2008   Past Medical History:  Diagnosis Date  . Anemia   . Bronchitis   . Cardiomyopathy secondary   . Developmental delay   . Diabetes mellitus without complication (HCC)   . Edema   . Hypercholesterolemia   . Unspecified essential hypertension    Social History   Tobacco Use  . Smoking status: Never Smoker  . Smokeless tobacco: Never Used  Vaping Use  . Vaping Use: Never used  Substance Use Topics  . Alcohol use: No    Alcohol/week: 0.0 standard drinks  . Drug use: No   No Known Allergies  Medications: Outpatient Medications Prior to Visit  Medication Sig  . atorvastatin (LIPITOR) 20 MG tablet TAKE 1 TABLET EVERY DAY  . Blood Glucose Monitoring Suppl (ONE TOUCH ULTRA 2) w/Device KIT Use as directed to check blood sugar once daily  . cetirizine (ZYRTEC) 10 MG tablet Take 1 tablet (10 mg total) by mouth daily.  . Cholecalciferol (VITAMIN D3) 125 MCG (5000 UT) CAPS TAKE 1 CAPSULE EVERY DAY  . Ferrous Sulfate (IRON) 325 (65 Fe) MG TABS Take 2 tablets by mouth daily.  . fluticasone (FLONASE) 50   MCG/ACT nasal spray Place 2 sprays into both nostrils daily.  . furosemide (LASIX) 20 MG tablet Take 1 tablet (20 mg total) by mouth daily.  . glucose blood (ONE TOUCH ULTRA TEST) test strip Use as instructed to check blood glucose daily  . lansoprazole (PREVACID) 30 MG capsule TAKE 1 CAPSULE EVERY DAY  . levothyroxine (SYNTHROID) 100 MCG tablet Take 1 tablet (100 mcg  total) by mouth daily.  . lisinopril (ZESTRIL) 20 MG tablet Take 1 tablet (20 mg total) by mouth daily.  . meloxicam (MOBIC) 15 MG tablet Take 1 tablet (15 mg total) by mouth daily.  . metFORMIN (GLUCOPHAGE-XR) 500 MG 24 hr tablet TAKE 2 TABLETS (1,000 MG TOTAL) EVERY EVENING  . Multiple Vitamins-Minerals (ONE-A-DAY WOMENS 50 PLUS PO) Take 1 tablet by mouth daily.  . neomycin-polymyxin-hydrocortisone (CORTISPORIN) OTIC solution Place 3 drops into the left ear 3 (three) times daily. (Patient not taking: Reported on 05/31/2020)  . ONETOUCH DELICA LANCETS 33G MISC Use as directed to check blood glucose daily  . polyethylene glycol powder (GLYCOLAX/MIRALAX) 17 GM/SCOOP powder Please use one cup of miralax daily.  . potassium chloride (KLOR-CON) 10 MEQ tablet TAKE 1 TABLET EVERY DAY  . Semaglutide,0.25 or 0.5MG/DOS, (OZEMPIC, 0.25 OR 0.5 MG/DOSE,) 2 MG/1.5ML SOPN Inject 0.25 mg into the skin once a week.  . SPIRIVA HANDIHALER 18 MCG inhalation capsule INHALE THE CONTENTS OF 1 CAPSULE EVERY DAY  VIA  HANDIHALER  . SYMBICORT 160-4.5 MCG/ACT inhaler INHALE 2 PUFFS TWICE DAILY  . vitamin B-12 (CYANOCOBALAMIN) 1000 MCG tablet Take 1,000 mcg by mouth daily.  . vitamin C (ASCORBIC ACID) 500 MG tablet Take 500 mg by mouth daily.   No facility-administered medications prior to visit.    Review of Systems  Constitutional: Negative for chills and fever.  HENT: Negative for ear pain, postnasal drip and sore throat.   Respiratory: Positive for cough. Negative for hemoptysis and shortness of breath.   Gastrointestinal: Negative for heartburn.      Objective    LMP  (LMP Unknown)    Speaking in full sentences with no respiratory distress.    Assessment & Plan     1. Post-viral cough syndrome - reassurance given tht cough can linger after viral URI for 2-4 weeks - no new fever or SOB to suggest CAP - no need for imaging at this time - discussed symptomatic management, natural course, and return  precautions - information relayed to her sister/guardian as well per patient request  No follow-ups on file.    I discussed the assessment and treatment plan with the patient. The patient was provided an opportunity to ask questions and all were answered. The patient agreed with the plan and demonstrated an understanding of the instructions.   The patient was advised to call back or seek an in-person evaluation if the symptoms worsen or if the condition fails to improve as anticipated.  I provided 12 minutes of non-face-to-face time during this encounter.  I, Angela Bacigalupo, MD, have reviewed all documentation for this visit. The documentation on 07/08/20 for the exam, diagnosis, procedures, and orders are all accurate and complete.   Bacigalupo, Angela M, MD, MPH [facility]    

## 2020-07-08 NOTE — Telephone Encounter (Signed)
Phone visit scheduled for today (07/08/2020) 11am.  Thanks,   -Vernona Rieger

## 2020-07-08 NOTE — Patient Instructions (Signed)

## 2020-07-26 ENCOUNTER — Other Ambulatory Visit: Payer: Self-pay

## 2020-07-26 ENCOUNTER — Ambulatory Visit (INDEPENDENT_AMBULATORY_CARE_PROVIDER_SITE_OTHER): Payer: Medicare Other | Admitting: Podiatry

## 2020-07-26 ENCOUNTER — Encounter: Payer: Self-pay | Admitting: Podiatry

## 2020-07-26 DIAGNOSIS — N182 Chronic kidney disease, stage 2 (mild): Secondary | ICD-10-CM | POA: Diagnosis not present

## 2020-07-26 DIAGNOSIS — E119 Type 2 diabetes mellitus without complications: Secondary | ICD-10-CM | POA: Diagnosis not present

## 2020-07-26 DIAGNOSIS — L84 Corns and callosities: Secondary | ICD-10-CM | POA: Diagnosis not present

## 2020-07-26 NOTE — Progress Notes (Signed)
This patient returns to my office for at risk foot care.  This patient requires this care by a professional since this patient will be at risk due to having diabetes and chronic kidney disease.  This patient has painful callus on the outside of her left foot.  This is painful walking and wearing her shoes.. This patient presents for at risk foot care today.    General Appearance  Alert, conversant and in no acute stress.  Vascular  Dorsalis pedis and posterior tibial  pulses are palpable  bilaterally.  Capillary return is within normal limits  bilaterally. Temperature is within normal limits  bilaterally.  Neurologic  Senn-Weinstein monofilament wire test within normal limits  bilaterally. Muscle power within normal limits bilaterally.  Nails Thick disfigured discolored nails with subungual debris  from hallux to fifth toes bilaterally. No evidence of bacterial infection or drainage bilaterally.  Orthopedic  No limitations of motion  feet .  No crepitus or effusions noted.  No bony pathology or digital deformities noted.  Skin  normotropic skin with noted bilaterally.  No signs of infections or ulcers noted.   Callus noted lateral aspect fifth metabase left foot.  Callus left foot   Consent was obtained for treatment procedures.   Debridement of callus left foot with # 15 blade followed by dremel use.   Return office visit  3 months                    Told patient to return for periodic foot care and evaluation due to potential at risk complications.   Helane Gunther DPM

## 2020-09-06 ENCOUNTER — Ambulatory Visit: Payer: Medicare Other | Admitting: Podiatry

## 2020-09-07 ENCOUNTER — Other Ambulatory Visit: Payer: Self-pay | Admitting: Family Medicine

## 2020-09-09 ENCOUNTER — Ambulatory Visit: Payer: Medicare Other | Admitting: Podiatry

## 2020-10-04 ENCOUNTER — Telehealth: Payer: Self-pay

## 2020-10-04 NOTE — Telephone Encounter (Signed)
Copied from CRM [id number]. Topic: General - Other >> Oct 04, 2020  1:35 PM Marylen Ponto wrote: Reason for CRM: Akeisha Cower with [employer] called for an update on the fax that was sent several times last week. Cb# [phone]

## 2020-10-05 DIAGNOSIS — H7403 Tympanosclerosis, bilateral: Secondary | ICD-10-CM | POA: Diagnosis not present

## 2020-10-05 DIAGNOSIS — H903 Sensorineural hearing loss, bilateral: Secondary | ICD-10-CM | POA: Diagnosis not present

## 2020-10-05 NOTE — Telephone Encounter (Signed)
NA, will try again 

## 2020-10-05 NOTE — Telephone Encounter (Signed)
Cyan Cower with [employer] called back to follow up on a fax. Please CB.

## 2020-10-06 NOTE — Telephone Encounter (Signed)
Anna-Marie Cower, calling again stating that he faxed over this paperwork again on 10/04/20. He is requesting to have a response. Please advise.

## 2020-10-08 ENCOUNTER — Telehealth: Payer: Self-pay

## 2020-10-08 NOTE — Telephone Encounter (Signed)
I spoke with pt's sister June (on pt's DPR). June stated that they have been receiving multiple calls about back braces and other medical supplies. June stated that pt doesn't need these supplies. After calling Gavriela Cower this seems to be a scam. Tonilynn Cower was advised. TNP

## 2020-10-08 NOTE — Telephone Encounter (Signed)
Please review, she is aware Dr. B is out of office. KW

## 2020-10-08 NOTE — Telephone Encounter (Signed)
Spoke with pt's sister June. June is concerned that pt was supposed to see Dr. Leonard Schwartz 10/11/20 but had to be pushed out until July due to Dr. Senaida Lange schedule. June request a call back to discuss if Dr. B wants to work pt in to be seen sooner than July to have pt's lab work done since pt has been taking Semaglutide,0.25 or 0.5MG /DOS, (OZEMPIC, 0.25 OR 0.5 MG/DOSE,) 2 MG/1.5ML SOPN  or if Dr. Leonard Schwartz thinks it would be ok for pt to get labs checked next week and then pt follow-up with Dr. B at the appt in July. Please advise. Thanks TNP

## 2020-10-11 ENCOUNTER — Ambulatory Visit: Payer: Medicare Other | Admitting: Family Medicine

## 2020-10-12 ENCOUNTER — Other Ambulatory Visit: Payer: Self-pay | Admitting: Family Medicine

## 2020-10-12 NOTE — Telephone Encounter (Signed)
Arranged follow ui

## 2020-10-12 NOTE — Telephone Encounter (Signed)
Requested Prescriptions  Pending Prescriptions Disp Refills  . furosemide (LASIX) 20 MG tablet [Pharmacy Med Name: FUROSEMIDE 20 MG Tablet] 90 tablet 0    Sig: TAKE 1 TABLET EVERY DAY     Cardiovascular:  Diuretics - Loop Failed - 10/12/2020  1:09 AM      Failed - Na in normal range and within 360 days    Sodium  Date Value Ref Range Status  04/12/2020 133 (L) 134 - 144 mmol/L Final         Failed - Last BP in normal range    BP Readings from Last 1 Encounters:  05/20/20 (!) 158/79         Passed - K in normal range and within 360 days    Potassium  Date Value Ref Range Status  04/12/2020 4.6 3.5 - 5.2 mmol/L Final         Passed - Ca in normal range and within 360 days    Calcium  Date Value Ref Range Status  04/12/2020 10.0 8.7 - 10.3 mg/dL Final         Passed - Cr in normal range and within 360 days    Creatinine, Ser  Date Value Ref Range Status  04/12/2020 0.72 0.57 - 1.00 mg/dL Final         Passed - Valid encounter within last 6 months    Recent Outpatient Visits          3 months ago Post-viral cough syndrome   [facility] [place], Marzella Schlein, MD   4 months ago Acute otitis externa of left ear, unspecified type   [facility] [place], Eula Fried, FNP   6 months ago Essential hypertension   [facility] [place], Marzella Schlein, MD   7 months ago Trochanteric bursitis of right hip   [facility] [place], Marzella Schlein, MD   1 year ago Essential hypertension   [facility] [place], Marzella Schlein, MD      Future Appointments            In 2 months Bacigalupo, Marzella Schlein, MD [facility]           . lisinopril (ZESTRIL) 20 MG tablet [Pharmacy Med Name: LISINOPRIL 20 MG Tablet] 90 tablet 0    Sig: TAKE 1 TABLET EVERY DAY     Cardiovascular:  ACE Inhibitors Failed - 10/12/2020  1:09 AM      Failed - Cr in normal range and within 180 days    Creatinine, Ser  Date Value  Ref Range Status  04/12/2020 0.72 0.57 - 1.00 mg/dL Final         Failed - K in normal range and within 180 days    Potassium  Date Value Ref Range Status  04/12/2020 4.6 3.5 - 5.2 mmol/L Final         Failed - Last BP in normal range    BP Readings from Last 1 Encounters:  05/20/20 (!) 158/79         Passed - Patient is not pregnant      Passed - Valid encounter within last 6 months    Recent Outpatient Visits          3 months ago Post-viral cough syndrome   [facility] [place], Marzella Schlein, MD   4 months ago Acute otitis externa of left ear, unspecified type   [facility] Fl[place]um, Eula Fried, FNP   6 months ago Essential  hypertension   [facility], Marzella Schlein, MD   7 months ago Trochanteric bursitis of right hip   [facility] [place], Marzella Schlein, MD   1 year ago Essential hypertension   [facility] [place]igalupo, Marzella Schlein, MD      Future Appointments            In 2 months Bacigalupo, Marzella Schlein, MD [facility]

## 2020-10-12 NOTE — Telephone Encounter (Signed)
If anyone has an appointment available within the next couple weeks, that would be better for her than waiting until July.  I doubt that I have any availability at this time, but if I do I will be happy to see her.  If no one is available, we could check her CMP, CBC, A1c and I will review those in the interim.

## 2020-10-19 ENCOUNTER — Other Ambulatory Visit: Payer: Self-pay

## 2020-10-19 DIAGNOSIS — D638 Anemia in other chronic diseases classified elsewhere: Secondary | ICD-10-CM | POA: Diagnosis not present

## 2020-10-19 DIAGNOSIS — I10 Essential (primary) hypertension: Secondary | ICD-10-CM | POA: Diagnosis not present

## 2020-10-19 DIAGNOSIS — E1169 Type 2 diabetes mellitus with other specified complication: Secondary | ICD-10-CM

## 2020-10-20 LAB — COMPREHENSIVE METABOLIC PANEL
ALT: 16 IU/L (ref 0–32)
AST: 14 IU/L (ref 0–40)
Albumin/Globulin Ratio: 1.9 (ref 1.2–2.2)
Albumin: 4.4 g/dL (ref 3.8–4.8)
Alkaline Phosphatase: 121 IU/L (ref 44–121)
BUN/Creatinine Ratio: 18 (ref 12–28)
BUN: 14 mg/dL (ref 8–27)
Bilirubin Total: 0.3 mg/dL (ref 0.0–1.2)
CO2: 23 mmol/L (ref 20–29)
Calcium: 10.2 mg/dL (ref 8.7–10.3)
Chloride: 96 mmol/L (ref 96–106)
Creatinine, Ser: 0.77 mg/dL (ref 0.57–1.00)
Globulin, Total: 2.3 g/dL (ref 1.5–4.5)
Glucose: 120 mg/dL — ABNORMAL HIGH (ref 65–99)
Potassium: 5.2 mmol/L (ref 3.5–5.2)
Sodium: 135 mmol/L (ref 134–144)
Total Protein: 6.7 g/dL (ref 6.0–8.5)
eGFR: 87 mL/min/{1.73_m2} (ref >59)

## 2020-10-20 LAB — CBC WITH DIFFERENTIAL/PLATELET
Basophils Absolute: 0 10*3/uL (ref 0.0–0.2)
Basos: 0 %
EOS (ABSOLUTE): 0.1 10*3/uL (ref 0.0–0.4)
Eos: 1 %
Hematocrit: 32.3 % — ABNORMAL LOW (ref 34.0–46.6)
Hemoglobin: 10.9 g/dL — ABNORMAL LOW (ref 11.1–15.9)
Immature Grans (Abs): 0 10*3/uL (ref 0.0–0.1)
Immature Granulocytes: 0 %
Lymphocytes Absolute: 1.1 10*3/uL (ref 0.7–3.1)
Lymphs: 17 %
MCH: 30.3 pg (ref 26.6–33.0)
MCHC: 33.7 g/dL (ref 31.5–35.7)
MCV: 90 fL (ref 79–97)
Monocytes Absolute: 0.5 10*3/uL (ref 0.1–0.9)
Monocytes: 8 %
Neutrophils Absolute: 4.8 10*3/uL (ref 1.4–7.0)
Neutrophils: 74 %
Platelets: 310 10*3/uL (ref 150–450)
RBC: 3.6 x10E6/uL — ABNORMAL LOW (ref 3.77–5.28)
RDW: 11.8 % (ref 11.7–15.4)
WBC: 6.5 10*3/uL (ref 3.4–10.8)

## 2020-10-20 LAB — HEMOGLOBIN A1C
Est. average glucose Bld gHb Est-mCnc: 163 mg/dL
Hgb A1c MFr Bld: 7.3 % — ABNORMAL HIGH (ref 4.8–5.6)

## 2020-10-25 ENCOUNTER — Other Ambulatory Visit: Payer: Self-pay

## 2020-10-25 ENCOUNTER — Encounter: Payer: Self-pay | Admitting: Podiatry

## 2020-10-25 ENCOUNTER — Ambulatory Visit (INDEPENDENT_AMBULATORY_CARE_PROVIDER_SITE_OTHER): Payer: Medicare Other | Admitting: Podiatry

## 2020-10-25 ENCOUNTER — Ambulatory Visit: Payer: Medicare Other | Admitting: Podiatry

## 2020-10-25 DIAGNOSIS — L84 Corns and callosities: Secondary | ICD-10-CM

## 2020-10-25 DIAGNOSIS — B351 Tinea unguium: Secondary | ICD-10-CM

## 2020-10-25 DIAGNOSIS — M79676 Pain in unspecified toe(s): Secondary | ICD-10-CM

## 2020-10-25 DIAGNOSIS — N182 Chronic kidney disease, stage 2 (mild): Secondary | ICD-10-CM

## 2020-10-25 DIAGNOSIS — E119 Type 2 diabetes mellitus without complications: Secondary | ICD-10-CM

## 2020-10-25 NOTE — Progress Notes (Signed)
This patient returns to my office for at risk foot care.  This patient requires this care by a professional since this patient will be at risk due to having diabetes and chronic kidney disease.  This patient has painful callus on the outside of her left foot.  This is painful walking and wearing her shoes.. This patient presents for at risk foot care today.    General Appearance  Alert, conversant and in no acute stress.  Vascular  Dorsalis pedis and posterior tibial  pulses are palpable  bilaterally.  Capillary return is within normal limits  bilaterally. Temperature is within normal limits  bilaterally.  Neurologic  Senn-Weinstein monofilament wire test within normal limits  bilaterally. Muscle power within normal limits bilaterally.  Nails Thick disfigured discolored nails with subungual debris  from hallux to fifth toes bilaterally. No evidence of bacterial infection or drainage bilaterally.  Orthopedic  No limitations of motion  feet .  No crepitus or effusions noted.  No bony pathology or digital deformities noted.  Skin  normotropic skin with noted bilaterally.  No signs of infections or ulcers noted.   Callus noted lateral aspect fifth metabase left foot.  Callus left foot   Consent was obtained for treatment procedures.   Debridement of callus left foot with # 15 blade followed by dremel use.   Return office visit  10 weeks                     Told patient to return for periodic foot care and evaluation due to potential at risk complications.   Helane Gunther DPM

## 2020-11-01 ENCOUNTER — Ambulatory Visit (INDEPENDENT_AMBULATORY_CARE_PROVIDER_SITE_OTHER): Payer: Medicare Other | Admitting: Family Medicine

## 2020-11-01 ENCOUNTER — Other Ambulatory Visit: Payer: Self-pay

## 2020-11-01 VITALS — BP 163/82 | HR 80 | Temp 98.1°F | Wt 207.0 lb

## 2020-11-01 DIAGNOSIS — Z5329 Procedure and treatment not carried out because of patient's decision for other reasons: Secondary | ICD-10-CM

## 2020-11-01 NOTE — Progress Notes (Deleted)
Established patient visit   Patient: Emma Fletcher   DOB: 09-05-1955   65 y.o. Female  MRN: [id number] Visit Date: 11/01/2020  Today's healthcare provider: Vernie Murders, PA-C   No chief complaint on file.  Subjective    HPI  Diabetes Mellitus Type II, Follow-up  Lab Results  Component Value Date   HGBA1C 7.3 (H) 10/19/2020   HGBA1C 7.4 (H) 04/12/2020   HGBA1C 7.4 (H) 06/23/2019   Wt Readings from Last 3 Encounters:  11/01/20 207 lb (93.9 kg)  05/20/20 216 lb (98 kg)  04/12/20 211 lb (95.7 kg)   Last seen for diabetes 4 months ago.  Management since then includes none. She reports good compliance with treatment. She is not having side effects.  Symptoms: No fatigue No foot ulcerations  No appetite changes No nausea  No paresthesia of the feet  No polydipsia  No polyuria No visual disturbances   No vomiting     Home blood sugar records: fasting range: 120's-160's  Episodes of hypoglycemia? No    Current insulin regiment: none   Pertinent Labs: Lab Results  Component Value Date   CHOL 154 04/12/2020   HDL 66 04/12/2020   LDLCALC 70 04/12/2020   TRIG 99 04/12/2020   CHOLHDL 2.3 04/12/2020   Lab Results  Component Value Date   NA 135 10/19/2020   K 5.2 10/19/2020   CREATININE 0.77 10/19/2020   GFRNONAA 89 04/12/2020   GFRAA 103 04/12/2020   GLUCOSE 120 (H) 10/19/2020     --------------------------------------------------------------------------------------------------- Hypertension, follow-up  BP Readings from Last 3 Encounters:  11/01/20 (!) 163/82  05/20/20 (!) 158/79  04/12/20 (!) 148/86   Wt Readings from Last 3 Encounters:  11/01/20 207 lb (93.9 kg)  05/20/20 216 lb (98 kg)  04/12/20 211 lb (95.7 kg)     She was last seen for hypertension 4 months ago.  BP at that visit was as above. Management since that visit includes none.  She reports good compliance with treatment. She is not having side effects.   Use of agents  associated with hypertension: none.   Outside blood pressures are not checked. Symptoms: No chest pain No chest pressure  No palpitations No syncope  No dyspnea No orthopnea  No paroxysmal nocturnal dyspnea No lower extremity edema   Pertinent labs: Lab Results  Component Value Date   CHOL 154 04/12/2020   HDL 66 04/12/2020   LDLCALC 70 04/12/2020   TRIG 99 04/12/2020   CHOLHDL 2.3 04/12/2020   Lab Results  Component Value Date   NA 135 10/19/2020   K 5.2 10/19/2020   CREATININE 0.77 10/19/2020   GFRNONAA 89 04/12/2020   GFRAA 103 04/12/2020   GLUCOSE 120 (H) 10/19/2020     The 10-year ASCVD risk score Mikey Bussing DC Jr., et al., 2013) is: 14%   ---------------------------------------------------------------------------------------------------       Medications: Outpatient Medications Prior to Visit  Medication Sig  . atorvastatin (LIPITOR) 20 MG tablet TAKE 1 TABLET EVERY DAY  . Blood Glucose Monitoring Suppl (ONE TOUCH ULTRA 2) w/Device KIT Use as directed to check blood sugar once daily  . Cholecalciferol (VITAMIN D3) 125 MCG (5000 UT) CAPS TAKE 1 CAPSULE EVERY DAY  . Ferrous Sulfate (IRON) 325 (65 Fe) MG TABS Take 2 tablets by mouth daily.  . furosemide (LASIX) 20 MG tablet TAKE 1 TABLET EVERY DAY  . glucose blood (ONE TOUCH ULTRA TEST) test strip Use as instructed to check blood glucose daily  .  lansoprazole (PREVACID) 30 MG capsule TAKE 1 CAPSULE EVERY DAY  . levothyroxine (SYNTHROID) 100 MCG tablet TAKE 1 TABLET EVERY DAY  . lisinopril (ZESTRIL) 20 MG tablet TAKE 1 TABLET EVERY DAY  . metFORMIN (GLUCOPHAGE-XR) 500 MG 24 hr tablet TAKE 2 TABLETS (1,000 MG TOTAL) EVERY EVENING  . Multiple Vitamins-Minerals (ONE-A-DAY WOMENS 50 PLUS PO) Take 1 tablet by mouth daily.  Marland Kitchen neomycin-polymyxin-hydrocortisone (CORTISPORIN) OTIC solution Place 3 drops into the left ear 3 (three) times daily.  Glory Rosebush DELICA LANCETS 13Y MISC Use as directed to check blood glucose daily  .  polyethylene glycol powder (GLYCOLAX/MIRALAX) 17 GM/SCOOP powder Please use one cup of miralax daily.  . Semaglutide,0.25 or 0.5MG/DOS, (OZEMPIC, 0.25 OR 0.5 MG/DOSE,) 2 MG/1.5ML SOPN Inject 0.25 mg into the skin once a week.  . SPIRIVA HANDIHALER 18 MCG inhalation capsule INHALE THE CONTENTS OF 1 CAPSULE EVERY DAY AS DIRECTED  . SYMBICORT 160-4.5 MCG/ACT inhaler INHALE 2 PUFFS TWICE DAILY  . vitamin B-12 (CYANOCOBALAMIN) 1000 MCG tablet Take 1,000 mcg by mouth daily.  . [DISCONTINUED] cetirizine (ZYRTEC) 10 MG tablet Take 1 tablet (10 mg total) by mouth daily. (Patient not taking: Reported on 11/01/2020)  . [DISCONTINUED] fluticasone (FLONASE) 50 MCG/ACT nasal spray Place 2 sprays into both nostrils daily. (Patient not taking: Reported on 11/01/2020)  . [DISCONTINUED] meloxicam (MOBIC) 15 MG tablet Take 1 tablet (15 mg total) by mouth daily. (Patient not taking: Reported on 11/01/2020)  . [DISCONTINUED] potassium chloride (KLOR-CON) 10 MEQ tablet TAKE 1 TABLET EVERY DAY (Patient not taking: Reported on 11/01/2020)  . [DISCONTINUED] vitamin C (ASCORBIC ACID) 500 MG tablet Take 500 mg by mouth daily. (Patient not taking: Reported on 11/01/2020)   No facility-administered medications prior to visit.    Review of Systems      Objective    BP (!) 163/82 (BP Location: Right Arm, Patient Position: Sitting, Cuff Size: Normal)   Pulse 80   Temp 98.1 F (36.7 C) (Oral)   Wt 207 lb (93.9 kg)   LMP  (LMP Unknown)   SpO2 100%   BMI 33.41 kg/m      Physical Exam    No results found for any visits on 11/01/20.  Assessment & Plan     ***  No follow-ups on file.      {provider attestation***:1}   Vernie Murders, Hershal Coria  [facility] [phone] (phone) [phone] (fax)  [place]

## 2020-11-22 ENCOUNTER — Other Ambulatory Visit: Payer: Self-pay | Admitting: Family Medicine

## 2020-11-22 DIAGNOSIS — Z1231 Encounter for screening mammogram for malignant neoplasm of breast: Secondary | ICD-10-CM

## 2020-12-01 ENCOUNTER — Other Ambulatory Visit: Payer: Self-pay

## 2020-12-01 ENCOUNTER — Ambulatory Visit
Admission: RE | Admit: 2020-12-01 | Discharge: 2020-12-01 | Disposition: A | Discharge status: Home / Self Care | Payer: Medicare Other | Source: Ambulatory Visit | Attending: Family Medicine | Admitting: Family Medicine

## 2020-12-01 DIAGNOSIS — Z1231 Encounter for screening mammogram for malignant neoplasm of breast: Secondary | ICD-10-CM | POA: Diagnosis not present

## 2020-12-09 ENCOUNTER — Telehealth: Payer: Self-pay

## 2020-12-09 NOTE — Telephone Encounter (Signed)
Copied from CRM [id number]. Topic: General - Call Back - No Documentation >> Dec 09, 2020  3:02 PM Randol Kern wrote: Emma Fletcher [phone]   Requesting office notes from the most recent visit. Missing this information for the freestyle libre. Fayrene Fearing called from [employer] contact center.

## 2020-12-19 DIAGNOSIS — Z20822 Contact with and (suspected) exposure to covid-19: Secondary | ICD-10-CM | POA: Diagnosis not present

## 2020-12-21 ENCOUNTER — Ambulatory Visit: Payer: Medicare Other | Admitting: Family Medicine

## 2020-12-28 ENCOUNTER — Encounter: Payer: Self-pay | Admitting: Family Medicine

## 2020-12-28 ENCOUNTER — Ambulatory Visit (INDEPENDENT_AMBULATORY_CARE_PROVIDER_SITE_OTHER): Payer: Medicare Other | Admitting: Family Medicine

## 2020-12-28 ENCOUNTER — Other Ambulatory Visit: Payer: Self-pay

## 2020-12-28 VITALS — BP 140/72 | HR 68 | Temp 98.6°F | Resp 16 | Ht 62.0 in | Wt 206.4 lb

## 2020-12-28 DIAGNOSIS — E1169 Type 2 diabetes mellitus with other specified complication: Secondary | ICD-10-CM | POA: Diagnosis not present

## 2020-12-28 DIAGNOSIS — I152 Hypertension secondary to endocrine disorders: Secondary | ICD-10-CM | POA: Diagnosis not present

## 2020-12-28 DIAGNOSIS — J449 Chronic obstructive pulmonary disease, unspecified: Secondary | ICD-10-CM

## 2020-12-28 DIAGNOSIS — E1159 Type 2 diabetes mellitus with other circulatory complications: Secondary | ICD-10-CM | POA: Diagnosis not present

## 2020-12-28 DIAGNOSIS — E785 Hyperlipidemia, unspecified: Secondary | ICD-10-CM

## 2020-12-28 DIAGNOSIS — I429 Cardiomyopathy, unspecified: Secondary | ICD-10-CM | POA: Diagnosis not present

## 2020-12-28 LAB — POCT GLYCOSYLATED HEMOGLOBIN (HGB A1C)
Est. average glucose Bld gHb Est-mCnc: 160
Hemoglobin A1C: 7.2 % — AB (ref 4.0–5.6)

## 2020-12-28 MED ORDER — SEMAGLUTIDE (1 MG/DOSE) 4 MG/3ML ~~LOC~~ SOPN: 1.0000 mg | PEN_INJECTOR | SUBCUTANEOUS | 3 refills | Status: DC

## 2020-12-28 MED ORDER — ATORVASTATIN CALCIUM 20 MG PO TABS: 20.0000 mg | ORAL_TABLET | Freq: Every day | ORAL | 3 refills | Status: DC

## 2020-12-28 NOTE — Assessment & Plan Note (Signed)
Previously well controlled Continue statin Repeat FLP and CMP at next visit 

## 2020-12-28 NOTE — Progress Notes (Signed)
Established patient visit   Patient: Emma Fletcher   DOB: [birth year]   65 y.o. Female  MRN: [id number] Visit Date: 12/28/2020  Today's healthcare provider: Lavon Paganini, MD   Chief Complaint  Patient presents with   Diabetes   Hypertension   Hyperlipidemia   Subjective    Diabetes Pertinent negatives for hypoglycemia include no dizziness or headaches. Pertinent negatives for diabetes include no chest pain, no fatigue and no weakness.  Hypertension Pertinent negatives include no chest pain, headaches, neck pain, palpitations or shortness of breath.  Hyperlipidemia Pertinent negatives include no chest pain, myalgias or shortness of breath.    Diabetes Mellitus Type II, follow-up  Lab Results  Component Value Date   HGBA1C 7.2 (A) 12/28/2020   HGBA1C 7.3 (H) 10/19/2020   HGBA1C 7.4 (H) 04/12/2020   Last seen for diabetes 8 months ago.  Management since then includes  Will add GLP-1 agonist to regimen She reports excellent compliance with treatment. 3 months and tolerating .25 to .50 mg Ozempic. She is not having side effects.  She has some weight loss associated with the Ozempic. Wt Readings from Last 3 Encounters:  12/28/20 206 lb 6.4 oz (93.6 kg)  11/01/20 207 lb (93.9 kg)  05/20/20 216 lb (98 kg)    Home blood sugar records: fasting range: 110-120's  Episodes of hypoglycemia? No    Current insulin regiment: none Most Recent Eye Exam: appt in October with [facility]  --------------------------------------------------------------------------------------------------- Hypertension, follow-up  BP Readings from Last 3 Encounters:  12/28/20 140/72  11/01/20 (!) 163/82  05/20/20 (!) 158/79   Wt Readings from Last 3 Encounters:  12/28/20 206 lb 6.4 oz (93.6 kg)  11/01/20 207 lb (93.9 kg)  05/20/20 216 lb (98 kg)     She was last seen for hypertension 8 months ago.  BP at that visit was 148/86. Management since that visit includes Recheck at next visit,  if persists above 140 consider increasing lisinopril dose to 40 mg. She reports excellent compliance with treatment. She is not having side effects.  She is exercising. She is adherent to low salt diet.   Outside blood pressures are not being checked.  She does not smoke.  Use of agents associated with hypertension: none.   --------------------------------------------------------------------------------------------------- Lipid/Cholesterol, follow-up  Last Lipid Panel: Lab Results  Component Value Date   CHOL 154 04/12/2020   LDLCALC 70 04/12/2020   HDL 66 04/12/2020   TRIG 99 04/12/2020    She was last seen for this 8 months ago.  Management since that visit includes no changes.  She reports excellent compliance with treatment. She is not having side effects.   Symptoms: No appetite changes No foot ulcerations  No chest pain No chest pressure/discomfort  No dyspnea No orthopnea  No fatigue No lower extremity edema  No palpitations No paroxysmal nocturnal dyspnea  No nausea No numbness or tingling of extremity  No polydipsia No polyuria  No speech difficulty No syncope   She is following a Low Sodium diet. She is working to limit starches in diet. Current exercise: walking  Last metabolic panel Lab Results  Component Value Date   GLUCOSE 120 (H) 10/19/2020   NA 135 10/19/2020   K 5.2 10/19/2020   BUN 14 10/19/2020   CREATININE 0.77 10/19/2020   GFRNONAA 89 04/12/2020   GFRAA 103 04/12/2020   CALCIUM 10.2 10/19/2020   AST 14 10/19/2020   ALT 16 10/19/2020   The 10-year ASCVD risk score Mikey Bussing  DC Jr., et al., 2013) is: 11.9%  Eye Exam: scheduled for 10/17 ---------------------------------------------------------------------------------------------------  Patient Active Problem List   Diagnosis Date Noted   Callus 07/26/2020   Allergic rhinitis 05/20/2020   Bursitis of right hip 02/26/2020   Bone pain 09/15/2019   Chronic obstructive pulmonary disease  ([place]) 06/23/2019   Hyperlipidemia associated with type 2 diabetes mellitus ([place]) 09/20/2017   Hypothyroidism 09/20/2017   CKD (chronic kidney disease), stage II 05/16/2016   T2DM (type 2 diabetes mellitus) ([place]) 12/18/2015   Hyponatremia 12/18/2015   Anemia of chronic disease 12/18/2015   Hypertension associated with diabetes ([place]) 11/10/2008   Secondary cardiomyopathy ([place]) 11/10/2008   EDEMA 11/10/2008   Social History   Tobacco Use   Smoking status: Never   Smokeless tobacco: Never  Vaping Use   Vaping Use: Never used  Substance Use Topics   Alcohol use: No    Alcohol/week: 0.0 standard drinks   Drug use: No   No Known Allergies  Medications: Outpatient Medications Prior to Visit  Medication Sig   Blood Glucose Monitoring Suppl (ONE TOUCH ULTRA 2) w/Device KIT Use as directed to check blood sugar once daily   Cholecalciferol (VITAMIN D3) 125 MCG (5000 UT) CAPS TAKE 1 CAPSULE EVERY DAY   Ferrous Sulfate (IRON) 325 (65 Fe) MG TABS Take 2 tablets by mouth daily.   furosemide (LASIX) 20 MG tablet TAKE 1 TABLET EVERY DAY   glucose blood (ONE TOUCH ULTRA TEST) test strip Use as instructed to check blood glucose daily   lansoprazole (PREVACID) 30 MG capsule TAKE 1 CAPSULE EVERY DAY   levothyroxine (SYNTHROID) 100 MCG tablet TAKE 1 TABLET EVERY DAY   lisinopril (ZESTRIL) 20 MG tablet TAKE 1 TABLET EVERY DAY   metFORMIN (GLUCOPHAGE-XR) 500 MG 24 hr tablet TAKE 2 TABLETS (1,000 MG TOTAL) EVERY EVENING   Multiple Vitamins-Minerals (ONE-A-DAY WOMENS 50 PLUS PO) Take 1 tablet by mouth daily.   neomycin-polymyxin-hydrocortisone (CORTISPORIN) OTIC solution Place 3 drops into the left ear 3 (three) times daily.   ONETOUCH DELICA LANCETS 01U MISC Use as directed to check blood glucose daily   polyethylene glycol powder (GLYCOLAX/MIRALAX) 17 GM/SCOOP powder Please use one cup of miralax daily.   SPIRIVA HANDIHALER 18 MCG inhalation capsule INHALE THE CONTENTS OF 1 CAPSULE EVERY DAY AS  DIRECTED   SYMBICORT 160-4.5 MCG/ACT inhaler INHALE 2 PUFFS TWICE DAILY   vitamin B-12 (CYANOCOBALAMIN) 1000 MCG tablet Take 1,000 mcg by mouth daily.   [DISCONTINUED] atorvastatin (LIPITOR) 20 MG tablet TAKE 1 TABLET EVERY DAY   [DISCONTINUED] Semaglutide,0.25 or 0.5MG/DOS, (OZEMPIC, 0.25 OR 0.5 MG/DOSE,) 2 MG/1.5ML SOPN Inject 0.25 mg into the skin once a week.   No facility-administered medications prior to visit.   Review of Systems  Constitutional:  Negative for activity change, appetite change, chills, fatigue and fever.  HENT:  Negative for ear pain, sinus pressure, sinus pain and sore throat.   Eyes:  Negative for pain and visual disturbance.  Respiratory:  Negative for cough, chest tightness, shortness of breath and wheezing.   Cardiovascular:  Negative for chest pain, palpitations and leg swelling.  Gastrointestinal:  Negative for abdominal pain, blood in stool, diarrhea, nausea and vomiting.  Genitourinary:  Negative for flank pain, frequency, pelvic pain and urgency.  Musculoskeletal:  Negative for back pain, myalgias and neck pain.  Neurological:  Negative for dizziness, weakness, light-headedness, numbness and headaches.      Objective    BP 140/72 (BP Location: Left Arm, Patient Position: Sitting, Cuff Size:  Large)   Pulse 68   Temp 98.6 F (37 C) (Oral)   Resp 16   Ht _0  (1.575 m)   Wt 206 lb 6.4 oz (93.6 kg)   LMP  (LMP Unknown)   SpO2 98%   BMI 37.75 kg/m  BP Readings from Last 3 Encounters:  12/28/20 140/72  11/01/20 (!) 163/82  05/20/20 (!) 158/79   Wt Readings from Last 3 Encounters:  12/28/20 206 lb 6.4 oz (93.6 kg)  11/01/20 207 lb (93.9 kg)  05/20/20 216 lb (98 kg)   Physical Exam Vitals reviewed.  Constitutional:      General: She is not in acute distress.    Appearance: Normal appearance. She is well-developed. She is not diaphoretic.  HENT:     Head: Normocephalic and atraumatic.  Eyes:     General: No scleral icterus.     Conjunctiva/sclera: Conjunctivae normal.  Neck:     Thyroid: No thyromegaly.  Cardiovascular:     Rate and Rhythm: Normal rate and regular rhythm.     Pulses: Normal pulses.     Heart sounds: Murmur heard.  Pulmonary:     Effort: Pulmonary effort is normal. No respiratory distress.     Breath sounds: Normal breath sounds. No wheezing, rhonchi or rales.  Musculoskeletal:     Cervical back: Neck supple.     Right lower leg: No edema.     Left lower leg: No edema.  Lymphadenopathy:     Cervical: No cervical adenopathy.  Skin:    General: Skin is warm and dry.     Findings: No rash.  Neurological:     Mental Status: She is alert and oriented to person, place, and time. Mental status is at baseline.  Psychiatric:        Mood and Affect: Mood normal.        Behavior: Behavior normal.    Results for orders placed or performed in visit on 12/28/20  POCT glycosylated hemoglobin (Hb A1C)  Result Value Ref Range   Hemoglobin A1C 7.2 (A) 4.0 - 5.6 %   Est. average glucose Bld gHb Est-mCnc 160     Assessment & Plan     Problem List Items Addressed This Visit       Cardiovascular and Mediastinum   Hypertension associated with diabetes ([place]) - Primary    Fairly well controlled Reviewed last BMP F/u in 3 months Can consider higher lisinopril dose       Relevant Medications   Semaglutide, 1 MG/DOSE, 4 MG/3ML SOPN   atorvastatin (LIPITOR) 20 MG tablet   Secondary cardiomyopathy (H[facility])    F/b Cards Continue to monitor BP as above       Relevant Medications   atorvastatin (LIPITOR) 20 MG tablet     Respiratory   Chronic obstructive pulmonary disease (H[facility])    Chronic and stable Continue spiriva         Endocrine   T2DM (type 2 diabetes mellitus) (HCC)    Improving, but still with hyperglycemia Continue metformin Increase ozempic to 23m weekly - tolerating well Upcoming eye exam On ACEi and statin F/u in 3 m and repeat A1c       Relevant Medications    Semaglutide, 1 MG/DOSE, 4 MG/3ML SOPN   atorvastatin (LIPITOR) 20 MG tablet   Other Relevant Orders   POCT glycosylated hemoglobin (Hb A1C) (Completed)   Hyperlipidemia associated with type 2 diabetes mellitus (HCC)    Previously well controlled Continue statin Repeat FLP and  CMP at next visit       Relevant Medications   Semaglutide, 1 MG/DOSE, 4 MG/3ML SOPN   atorvastatin (LIPITOR) 20 MG tablet   Return in about 3 months (around 03/30/2021) for chronic disease f/u.      I,Essence Turner,acting as a Education administrator for Lavon Paganini, MD.,have documented all relevant documentation on the behalf of Lavon Paganini, MD,as directed by  Lavon Paganini, MD while in the presence of Lavon Paganini, MD.  I, Lavon Paganini, MD, have reviewed all documentation for this visit. The documentation on 12/28/20 for the exam, diagnosis, procedures, and orders are all accurate and complete.   Cheris Tweten, Dionne Bucy, MD, MPH [facility]

## 2020-12-28 NOTE — Assessment & Plan Note (Signed)
Improving, but still with hyperglycemia Continue metformin Increase ozempic to 1mg  weekly - tolerating well Upcoming eye exam On ACEi and statin F/u in 3 m and repeat A1c

## 2020-12-28 NOTE — Patient Instructions (Signed)

## 2020-12-28 NOTE — Assessment & Plan Note (Signed)
F/b Cards Continue to monitor BP as above

## 2020-12-28 NOTE — Assessment & Plan Note (Signed)
Fairly well controlled Reviewed last BMP F/u in 3 months Can consider higher lisinopril dose

## 2020-12-28 NOTE — Assessment & Plan Note (Signed)
Chronic and stable Continue spiriva

## 2020-12-31 ENCOUNTER — Other Ambulatory Visit: Payer: Self-pay | Admitting: Family Medicine

## 2021-01-17 ENCOUNTER — Ambulatory Visit (INDEPENDENT_AMBULATORY_CARE_PROVIDER_SITE_OTHER): Payer: Medicare Other | Admitting: Podiatry

## 2021-01-17 ENCOUNTER — Encounter: Payer: Self-pay | Admitting: Podiatry

## 2021-01-17 ENCOUNTER — Other Ambulatory Visit: Payer: Self-pay

## 2021-01-17 DIAGNOSIS — N182 Chronic kidney disease, stage 2 (mild): Secondary | ICD-10-CM

## 2021-01-17 DIAGNOSIS — M79675 Pain in left toe(s): Secondary | ICD-10-CM | POA: Diagnosis not present

## 2021-01-17 DIAGNOSIS — M79674 Pain in right toe(s): Secondary | ICD-10-CM

## 2021-01-17 DIAGNOSIS — M79676 Pain in unspecified toe(s): Secondary | ICD-10-CM

## 2021-01-17 DIAGNOSIS — E119 Type 2 diabetes mellitus without complications: Secondary | ICD-10-CM | POA: Diagnosis not present

## 2021-01-17 DIAGNOSIS — B351 Tinea unguium: Secondary | ICD-10-CM | POA: Diagnosis not present

## 2021-01-17 DIAGNOSIS — L84 Corns and callosities: Secondary | ICD-10-CM

## 2021-01-17 NOTE — Progress Notes (Signed)
This patient returns to my office for at risk foot care.  This patient requires this care by a professional since this patient will be at risk due to having diabetes and chronic kidney disease.  This patient has painful callus on the outside of her left foot.  This is painful walking and wearing her shoes.. This patient presents for at risk foot care today.    General Appearance  Alert, conversant and in no acute stress.  Vascular  Dorsalis pedis and posterior tibial  pulses are palpable  bilaterally.  Capillary return is within normal limits  bilaterally. Temperature is within normal limits  bilaterally.  Neurologic  Senn-Weinstein monofilament wire test within normal limits  bilaterally. Muscle power within normal limits bilaterally.  Nails Thick disfigured discolored nails with subungual debris  from hallux to fifth toes bilaterally. No evidence of bacterial infection or drainage bilaterally.  Orthopedic  No limitations of motion  feet .  No crepitus or effusions noted.  No bony pathology or digital deformities noted.  Skin  normotropic skin with noted bilaterally.  No signs of infections or ulcers noted.   Callus noted lateral aspect fifth metabase left foot.  Callus left foot   Consent was obtained for treatment procedures.   Debridement of callus left foot with # 15 blade followed by dremel use.  Debride nails with nail nipper and dremel tool usage.   Return office visit  10 weeks                     Told patient to return for periodic foot care and evaluation due to potential at risk complications.   Helane Gunther DPM

## 2021-01-18 DIAGNOSIS — Z20822 Contact with and (suspected) exposure to covid-19: Secondary | ICD-10-CM | POA: Diagnosis not present

## 2021-02-14 ENCOUNTER — Other Ambulatory Visit: Payer: Self-pay | Admitting: Family Medicine

## 2021-02-14 MED ORDER — POTASSIUM CHLORIDE CRYS ER 10 MEQ PO TBCR: 10.0000 meq | EXTENDED_RELEASE_TABLET | Freq: Two times a day (BID) | ORAL | 1 refills | Status: DC

## 2021-02-14 NOTE — Telephone Encounter (Signed)
Copied from CRM [id number]. Topic: Quick Communication - Rx Refill/Question >> Feb 14, 2021 11:22 AM Gaetana Michaelis A wrote: Medication: potassium chloride (KLOR-CON) 10 MEQ tablet [[id number]  Has the patient contacted their pharmacy? Yes.   (Agent: If no, request that the patient contact the pharmacy for the refill.) (Agent: If yes, when and what did the pharmacy advise?)  Preferred Pharmacy (with phone number or street name): [facility] (Now [facility]) - [place], [place] - [address]  Phone:  [phone] Fax:  [phone]  Agent: Please be advised that RX refills may take up to 3 business days. We ask that you follow-up with your pharmacy.

## 2021-02-14 NOTE — Telephone Encounter (Signed)
I do not see requested med on active med list.

## 2021-02-14 NOTE — Telephone Encounter (Signed)
Opened in error

## 2021-03-04 DIAGNOSIS — Z20822 Contact with and (suspected) exposure to covid-19: Secondary | ICD-10-CM | POA: Diagnosis not present

## 2021-03-11 DIAGNOSIS — Z20822 Contact with and (suspected) exposure to covid-19: Secondary | ICD-10-CM | POA: Diagnosis not present

## 2021-03-15 ENCOUNTER — Other Ambulatory Visit: Payer: Self-pay | Admitting: Family Medicine

## 2021-03-16 NOTE — Telephone Encounter (Signed)
Requested medication (s) are due for refill today:   Yes  Requested medication (s) are on the active medication list:   Yes  Future visit scheduled:   Yes 10/18 with Bacigalupo   Last ordered: 03/11/2020 #90, 3 refills  Returned because this is a non delegated refill.   This dose is for 5000 UT but protocol is putting it as 50,000 IU.      Requested Prescriptions  Pending Prescriptions Disp Refills   Cholecalciferol (VITAMIN D3) 125 MCG (5000 UT) CAPS [Pharmacy Med Name: VITAMIN D3 125 MCG (5000 UT) Capsule] 90 capsule 3    Sig: TAKE 1 CAPSULE EVERY DAY     Endocrinology:  Vitamins - Vitamin D Supplementation Failed - 03/15/2021  9:14 PM      Failed - 50,000 IU strengths are not delegated      Failed - Phosphate in normal range and within 360 days    No results found for: PHOS        Failed - Vitamin D in normal range and within 360 days    Vit D, 25-Hydroxy  Date Value Ref Range Status  06/23/2019 54.6 30.0 - 100.0 ng/mL Final    Comment:    Vitamin D deficiency has been defined by the Institute of Medicine and an Endocrine Society practice guideline as a level of serum 25-OH vitamin D less than 20 ng/mL (1,2). The Endocrine Society went on to further define vitamin D insufficiency as a level between 21 and 29 ng/mL (2). 1. IOM (Institute of Medicine). 2010. Dietary reference    intakes for calcium and D. [place]: The    [employer]. 2. Holick MF, [place] Baker, Bischoff-Ferrari HA, et al.    Evaluation, treatment, and prevention of vitamin D    deficiency: an Endocrine Society clinical practice    guideline. JCEM. 2011 Jul; 96(7):1911-30.           Passed - Ca in normal range and within 360 days    Calcium  Date Value Ref Range Status  10/19/2020 10.2 8.7 - 10.3 mg/dL Final          Passed - Valid encounter within last 12 months    Recent Outpatient Visits           2 months ago Hypertension associated with diabetes [facility])   [facility] [place], Marzella Schlein, MD   4 months ago    [facility], Jodell Cipro, PA-C   8 months ago Post-viral cough syndrome   [facility], Marzella Schlein, MD   10 months ago Acute otitis externa of left ear, unspecified type   [facility] [place], Eula Fried, FNP   11 months ago Essential hypertension   [facility] Bac[facility], Marzella Schlein, MD       Future Appointments             In 2 weeks Bacigalupo, Marzella Schlein, MD [facility], [facility]

## 2021-03-25 ENCOUNTER — Telehealth: Payer: Self-pay | Admitting: Family Medicine

## 2021-03-25 MED ORDER — VITAMIN D3 125 MCG (5000 UT) PO CAPS: 1.0000 | ORAL_CAPSULE | Freq: Every day | ORAL | 1 refills | Status: DC

## 2021-03-25 MED ORDER — FUROSEMIDE 20 MG PO TABS: 20.0000 mg | ORAL_TABLET | Freq: Every day | ORAL | 1 refills | Status: DC

## 2021-03-25 NOTE — Telephone Encounter (Signed)
[facility] faxed refill request for the following medications:   furosemide (LASIX) 20 MG tablet   Vitamin D3 CAP  Please advise.

## 2021-04-04 ENCOUNTER — Ambulatory Visit: Payer: Medicare Other | Admitting: Family Medicine

## 2021-04-04 DIAGNOSIS — E119 Type 2 diabetes mellitus without complications: Secondary | ICD-10-CM | POA: Diagnosis not present

## 2021-04-04 LAB — HM DIABETES EYE EXAM

## 2021-04-05 ENCOUNTER — Ambulatory Visit (INDEPENDENT_AMBULATORY_CARE_PROVIDER_SITE_OTHER): Payer: Medicare Other | Admitting: Family Medicine

## 2021-04-05 ENCOUNTER — Other Ambulatory Visit: Payer: Self-pay

## 2021-04-05 ENCOUNTER — Encounter: Payer: Self-pay | Admitting: Family Medicine

## 2021-04-05 VITALS — BP 122/60

## 2021-04-05 DIAGNOSIS — D638 Anemia in other chronic diseases classified elsewhere: Secondary | ICD-10-CM

## 2021-04-05 DIAGNOSIS — I152 Hypertension secondary to endocrine disorders: Secondary | ICD-10-CM

## 2021-04-05 DIAGNOSIS — E039 Hypothyroidism, unspecified: Secondary | ICD-10-CM | POA: Diagnosis not present

## 2021-04-05 DIAGNOSIS — Z23 Encounter for immunization: Secondary | ICD-10-CM

## 2021-04-05 DIAGNOSIS — J449 Chronic obstructive pulmonary disease, unspecified: Secondary | ICD-10-CM | POA: Diagnosis not present

## 2021-04-05 DIAGNOSIS — E785 Hyperlipidemia, unspecified: Secondary | ICD-10-CM | POA: Diagnosis not present

## 2021-04-05 DIAGNOSIS — E1159 Type 2 diabetes mellitus with other circulatory complications: Secondary | ICD-10-CM | POA: Diagnosis not present

## 2021-04-05 DIAGNOSIS — E559 Vitamin D deficiency, unspecified: Secondary | ICD-10-CM | POA: Diagnosis not present

## 2021-04-05 DIAGNOSIS — E1169 Type 2 diabetes mellitus with other specified complication: Secondary | ICD-10-CM

## 2021-04-05 LAB — POCT GLYCOSYLATED HEMOGLOBIN (HGB A1C): Hemoglobin A1C: 6.9 % — AB (ref 4.0–5.6)

## 2021-04-05 MED ORDER — LISINOPRIL 20 MG PO TABS: 20.0000 mg | ORAL_TABLET | Freq: Every day | ORAL | 3 refills | Status: DC

## 2021-04-05 MED ORDER — VITAMIN D3 125 MCG (5000 UT) PO CAPS: 1.0000 | ORAL_CAPSULE | Freq: Every day | ORAL | 1 refills | Status: DC

## 2021-04-05 MED ORDER — FUROSEMIDE 20 MG PO TABS: 20.0000 mg | ORAL_TABLET | Freq: Every day | ORAL | 1 refills | Status: DC

## 2021-04-05 MED ORDER — METFORMIN HCL ER 500 MG PO TB24: 1000.0000 mg | ORAL_TABLET | Freq: Every day | ORAL | 3 refills | Status: DC

## 2021-04-05 MED ORDER — LANSOPRAZOLE 30 MG PO CPDR: 30.0000 mg | DELAYED_RELEASE_CAPSULE | Freq: Every day | ORAL | 3 refills | Status: DC

## 2021-04-05 NOTE — Assessment & Plan Note (Signed)
Well-controlled on home readings Bring home cuff to next appointment to compare with ours Continue current medications Recheck metabolic panel Follow-up in 3-6 months

## 2021-04-05 NOTE — Progress Notes (Addendum)
Established patient visit   Patient: Emma Fletcher   DOB: Aug 10, 1955   65 y.o. Female  MRN: [id number] Visit Date: 04/05/2021  Today's healthcare provider: Lavon Paganini, MD   Chief Complaint  Patient presents with   Diabetes   Hypertension   Hyperlipidemia   Subjective    HPI    Diabetes Mellitus Type II, follow-up  Lab Results  Component Value Date   HGBA1C 6.9 (A) 04/05/2021   HGBA1C 7.2 (A) 12/28/2020   HGBA1C 7.3 (H) 10/19/2020   Last seen for diabetes 3 months ago.  Management since then includes Continue metformin Increase ozempic to 64m weekly  She reports excellent compliance with treatment. She is having side effects.   Home blood sugar records: fasting range: 104  Episodes of hypoglycemia? No    Current insulin regiment: none Most Recent Eye Exam: 04/04/2021 [facility]  --------------------------------------------------------------------------------------------------- Hypertension, follow-up  BP Readings from Last 3 Encounters:  04/05/21 122/60  12/28/20 140/72  11/01/20 (!) 163/82   Wt Readings from Last 3 Encounters:  12/28/20 206 lb 6.4 oz (93.6 kg)  11/01/20 207 lb (93.9 kg)  05/20/20 216 lb (98 kg)     She was last seen for hypertension 3 months ago.  BP at that visit was 140/72. Management since that visit includes none. She reports excellent compliance with treatment. She is not having side effects.  She is exercising. She is adherent to low salt diet.   Outside blood pressures are systolic ranging [id number] 701-75  She does not smoke.  Use of agents associated with hypertension: none.   --------------------------------------------------------------------------------------------------- Lipid/Cholesterol, follow-up  Last Lipid Panel: Lab Results  Component Value Date   CHOL 145 04/05/2021   LDLCALC 66 04/05/2021   HDL 63 04/05/2021   TRIG 86 04/05/2021    She was last seen for this 3 months ago.   Management since that visit includes none continue Statin.  She reports excellent compliance with treatment. She is not having side effects.   Symptoms: No appetite changes No foot ulcerations  No chest pain No chest pressure/discomfort  No dyspnea No orthopnea  No fatigue No lower extremity edema  No palpitations No paroxysmal nocturnal dyspnea  No nausea No numbness or tingling of extremity  No polydipsia No polyuria  No speech difficulty No syncope   She is following a Regular diet. Current exercise: no regular exercise  Last metabolic panel Lab Results  Component Value Date   GLUCOSE 174 (H) 04/05/2021   NA 132 (L) 04/05/2021   K 5.0 04/05/2021   BUN 11 04/05/2021   CREATININE 0.81 04/05/2021   EGFR 81 04/05/2021   GFRNONAA 89 04/12/2020   CALCIUM 10.1 04/05/2021   AST 12 04/05/2021   ALT 12 04/05/2021   The 10-year ASCVD risk score (Arnett DK, et al., 2019) is: 9%  ---------------------------------------------------------------------------------------------------     Medications: Outpatient Medications Prior to Visit  Medication Sig   atorvastatin (LIPITOR) 20 MG tablet Take 1 tablet (20 mg total) by mouth daily.   Blood Glucose Monitoring Suppl (ONE TOUCH ULTRA 2) w/Device KIT Use as directed to check blood sugar once daily   Ferrous Sulfate (IRON) 325 (65 Fe) MG TABS Take 2 tablets by mouth daily.   glucose blood (ONE TOUCH ULTRA TEST) test strip Use as instructed to check blood glucose daily   levothyroxine (SYNTHROID) 100 MCG tablet TAKE 1 TABLET EVERY DAY   Multiple Vitamins-Minerals (ONE-A-DAY WOMENS 50 PLUS PO) Take 1 tablet by  mouth daily.   neomycin-polymyxin-hydrocortisone (CORTISPORIN) OTIC solution Place 3 drops into the left ear 3 (three) times daily.   ONETOUCH DELICA LANCETS 21J MISC Use as directed to check blood glucose daily   polyethylene glycol powder (GLYCOLAX/MIRALAX) 17 GM/SCOOP powder Please use one cup of miralax daily.   potassium  chloride (KLOR-CON) 10 MEQ tablet Take 1 tablet (10 mEq total) by mouth 2 (two) times daily.   Semaglutide, 1 MG/DOSE, 4 MG/3ML SOPN Inject 1 mg as directed once a week.   SPIRIVA HANDIHALER 18 MCG inhalation capsule INHALE THE CONTENTS OF 1 CAPSULE EVERY DAY AS DIRECTED   SYMBICORT 160-4.5 MCG/ACT inhaler INHALE 2 PUFFS TWICE DAILY   vitamin B-12 (CYANOCOBALAMIN) 1000 MCG tablet Take 1,000 mcg by mouth daily.   [DISCONTINUED] Cholecalciferol (VITAMIN D3) 125 MCG (5000 UT) CAPS Take 1 capsule (5,000 Units total) by mouth daily.   [DISCONTINUED] furosemide (LASIX) 20 MG tablet Take 1 tablet (20 mg total) by mouth daily.   [DISCONTINUED] lansoprazole (PREVACID) 30 MG capsule TAKE 1 CAPSULE EVERY DAY   [DISCONTINUED] lisinopril (ZESTRIL) 20 MG tablet TAKE 1 TABLET EVERY DAY   [DISCONTINUED] metFORMIN (GLUCOPHAGE-XR) 500 MG 24 hr tablet TAKE 2 TABLETS (1,000 MG TOTAL) EVERY EVENING   No facility-administered medications prior to visit.    Review of Systems - per HPI     Objective    BP 122/60 Comment: home reading  LMP  (LMP Unknown)  {Show previous vital signs (optional):23777}  Physical Exam Vitals reviewed.  Constitutional:      General: She is not in acute distress.    Appearance: Normal appearance. She is well-developed. She is not diaphoretic.  HENT:     Head: Normocephalic and atraumatic.  Eyes:     General: No scleral icterus.    Conjunctiva/sclera: Conjunctivae normal.  Neck:     Thyroid: No thyromegaly.  Cardiovascular:     Rate and Rhythm: Normal rate and regular rhythm.     Pulses: Normal pulses.     Heart sounds: Normal heart sounds. No murmur heard. Pulmonary:     Effort: Pulmonary effort is normal. No respiratory distress.     Breath sounds: Normal breath sounds. No wheezing, rhonchi or rales.  Musculoskeletal:     Cervical back: Neck supple.     Right lower leg: No edema.     Left lower leg: No edema.  Lymphadenopathy:     Cervical: No cervical adenopathy.   Skin:    General: Skin is warm and dry.     Findings: No rash.  Neurological:     Mental Status: She is alert and oriented to person, place, and time. Mental status is at baseline.  Psychiatric:        Mood and Affect: Mood normal.        Behavior: Behavior normal.      Results for orders placed or performed in visit on 04/05/21  Lipid panel  Result Value Ref Range   Cholesterol, Total 145 100 - 199 mg/dL   Triglycerides 86 0 - 149 mg/dL   HDL 63 >39 mg/dL   VLDL Cholesterol Cal 16 5 - 40 mg/dL   LDL Chol Calc (NIH) 66 0 - 99 mg/dL   Chol/HDL Ratio 2.3 0.0 - 4.4 ratio  Comprehensive metabolic panel  Result Value Ref Range   Glucose 174 (H) 70 - 99 mg/dL   BUN 11 8 - 27 mg/dL   Creatinine, Ser 0.81 0.57 - 1.00 mg/dL   eGFR 81 >59 mL/min/1.73  BUN/Creatinine Ratio 14 12 - 28   Sodium 132 (L) 134 - 144 mmol/L   Potassium 5.0 3.5 - 5.2 mmol/L   Chloride 96 96 - 106 mmol/L   CO2 25 20 - 29 mmol/L   Calcium 10.1 8.7 - 10.3 mg/dL   Total Protein 6.5 6.0 - 8.5 g/dL   Albumin 4.3 3.8 - 4.8 g/dL   Globulin, Total 2.2 1.5 - 4.5 g/dL   Albumin/Globulin Ratio 2.0 1.2 - 2.2   Bilirubin Total 0.2 0.0 - 1.2 mg/dL   Alkaline Phosphatase 123 (H) 44 - 121 IU/L   AST 12 0 - 40 IU/L   ALT 12 0 - 32 IU/L  CBC  Result Value Ref Range   WBC 8.4 3.4 - 10.8 x10E3/uL   RBC 3.39 (L) 3.77 - 5.28 x10E6/uL   Hemoglobin 10.2 (L) 11.1 - 15.9 g/dL   Hematocrit 30.3 (L) 34.0 - 46.6 %   MCV 89 79 - 97 fL   MCH 30.1 26.6 - 33.0 pg   MCHC 33.7 31.5 - 35.7 g/dL   RDW 11.2 (L) 11.7 - 15.4 %   Platelets 293 150 - 450 x10E3/uL  TSH  Result Value Ref Range   TSH 1.310 0.450 - 4.500 uIU/mL  VITAMIN D 25 Hydroxy (Vit-D Deficiency, Fractures)  Result Value Ref Range   Vit D, 25-Hydroxy 67.9 30.0 - 100.0 ng/mL  POCT glycosylated hemoglobin (Hb A1C)  Result Value Ref Range   Hemoglobin A1C 6.9 (A) 4.0 - 5.6 %   HbA1c POC (<> result, manual entry)     HbA1c, POC (prediabetic range)     HbA1c, POC  (controlled diabetic range)      Assessment & Plan     Problem List Items Addressed This Visit       Cardiovascular and Mediastinum   Hypertension associated with diabetes ([place])    Well-controlled on home readings Bring home cuff to next appointment to compare with ours Continue current medications Recheck metabolic panel Follow-up in 3-6 months      Relevant Medications   furosemide (LASIX) 20 MG tablet   lisinopril (ZESTRIL) 20 MG tablet   metFORMIN (GLUCOPHAGE-XR) 500 MG 24 hr tablet   Other Relevant Orders   Comprehensive metabolic panel (Completed)     Respiratory   Chronic obstructive pulmonary disease (HCC)    Chronic and stable Continue Spiriva She did have some wheezing when she had COVID recently Encouraged her to use albuterol when she is wheezing        Endocrine   T2DM (type 2 diabetes mellitus) ([place]) - Primary    Well-controlled today No changes to medications Discussed diet and exercise Up-to-date on screenings, but will send ROI for last eye exam Follow-up in 3 to 6 months and repeat A1c at next visit      Relevant Medications   lisinopril (ZESTRIL) 20 MG tablet   metFORMIN (GLUCOPHAGE-XR) 500 MG 24 hr tablet   Other Relevant Orders   POCT glycosylated hemoglobin (Hb A1C) (Completed)   Hyperlipidemia associated with type 2 diabetes mellitus (HCC)    Previously well controlled Continue statin Recheck FLP and CMP      Relevant Medications   furosemide (LASIX) 20 MG tablet   lisinopril (ZESTRIL) 20 MG tablet   metFORMIN (GLUCOPHAGE-XR) 500 MG 24 hr tablet   Other Relevant Orders   Lipid panel (Completed)   Comprehensive metabolic panel (Completed)   Hypothyroidism    Previously well controlled Continue Synthroid at current dose  Recheck TSH  and adjust Synthroid as indicated        Relevant Orders   TSH (Completed)     Other   Anemia of chronic disease    Continue to monitor hemoglobin      Relevant Orders   CBC (Completed)    Other Visit Diagnoses     Need for influenza vaccination       Relevant Orders   Flu Vaccine QUAD 54moIM (Fluarix, Fluzone & Alfiuria Quad PF) (Completed)   Avitaminosis D       Relevant Orders   VITAMIN D 25 Hydroxy (Vit-D Deficiency, Fractures)        Return in about 3 months (around 07/06/2021) for chronic disease f/u.      I, ALavon Paganini MD, have reviewed all documentation for this visit. The documentation on 04/06/21 for the exam, diagnosis, procedures, and orders are all accurate and complete.   Jocsan Mcginley, ADionne Bucy MD, MPH [facility]

## 2021-04-05 NOTE — Assessment & Plan Note (Signed)
Continue to monitor hemoglobin

## 2021-04-05 NOTE — Assessment & Plan Note (Signed)
Previously well controlled Continue Synthroid at current dose  Recheck TSH and adjust Synthroid as indicated   

## 2021-04-05 NOTE — Assessment & Plan Note (Signed)
Previously well controlled Continue statin Recheck FLP and CMP 

## 2021-04-05 NOTE — Assessment & Plan Note (Signed)
Chronic and stable Continue Spiriva She did have some wheezing when she had COVID recently Encouraged her to use albuterol when she is wheezing

## 2021-04-05 NOTE — Assessment & Plan Note (Signed)
Well-controlled today No changes to medications Discussed diet and exercise Up-to-date on screenings, but will send ROI for last eye exam Follow-up in 3 to 6 months and repeat A1c at next visit

## 2021-04-06 LAB — VITAMIN D 25 HYDROXY (VIT D DEFICIENCY, FRACTURES): Vit D, 25-Hydroxy: 67.9 ng/mL (ref 30.0–100.0)

## 2021-04-06 LAB — LIPID PANEL
Chol/HDL Ratio: 2.3 ratio (ref 0.0–4.4)
Cholesterol, Total: 145 mg/dL (ref 100–199)
HDL: 63 mg/dL (ref >39)
LDL Chol Calc (NIH): 66 mg/dL (ref 0–99)
Triglycerides: 86 mg/dL (ref 0–149)
VLDL Cholesterol Cal: 16 mg/dL (ref 5–40)

## 2021-04-06 LAB — CBC
Hematocrit: 30.3 % — ABNORMAL LOW (ref 34.0–46.6)
Hemoglobin: 10.2 g/dL — ABNORMAL LOW (ref 11.1–15.9)
MCH: 30.1 pg (ref 26.6–33.0)
MCHC: 33.7 g/dL (ref 31.5–35.7)
MCV: 89 fL (ref 79–97)
Platelets: 293 10*3/uL (ref 150–450)
RBC: 3.39 x10E6/uL — ABNORMAL LOW (ref 3.77–5.28)
RDW: 11.2 % — ABNORMAL LOW (ref 11.7–15.4)
WBC: 8.4 10*3/uL (ref 3.4–10.8)

## 2021-04-06 LAB — COMPREHENSIVE METABOLIC PANEL
ALT: 12 IU/L (ref 0–32)
AST: 12 IU/L (ref 0–40)
Albumin/Globulin Ratio: 2 (ref 1.2–2.2)
Albumin: 4.3 g/dL (ref 3.8–4.8)
Alkaline Phosphatase: 123 IU/L — ABNORMAL HIGH (ref 44–121)
BUN/Creatinine Ratio: 14 (ref 12–28)
BUN: 11 mg/dL (ref 8–27)
Bilirubin Total: 0.2 mg/dL (ref 0.0–1.2)
CO2: 25 mmol/L (ref 20–29)
Calcium: 10.1 mg/dL (ref 8.7–10.3)
Chloride: 96 mmol/L (ref 96–106)
Creatinine, Ser: 0.81 mg/dL (ref 0.57–1.00)
Globulin, Total: 2.2 g/dL (ref 1.5–4.5)
Glucose: 174 mg/dL — ABNORMAL HIGH (ref 70–99)
Potassium: 5 mmol/L (ref 3.5–5.2)
Sodium: 132 mmol/L — ABNORMAL LOW (ref 134–144)
Total Protein: 6.5 g/dL (ref 6.0–8.5)
eGFR: 81 mL/min/{1.73_m2} (ref >59)

## 2021-04-06 LAB — TSH: TSH: 1.31 u[IU]/mL (ref 0.450–4.500)

## 2021-04-14 ENCOUNTER — Telehealth: Payer: Self-pay

## 2021-04-14 ENCOUNTER — Telehealth: Payer: Self-pay | Admitting: Family Medicine

## 2021-04-14 NOTE — Telephone Encounter (Signed)
Please see other telephone encounter for  today.

## 2021-04-14 NOTE — Telephone Encounter (Signed)
Pt. Calling about recent labs. Call dropped. Attempted to call pt. Back, busy signal on phone.

## 2021-04-14 NOTE — Telephone Encounter (Signed)
Copied from CRM [id number]. Topic: General - Other >> Apr 14, 2021  8:39 AM Emma Fletcher wrote: Reason for CRM: patient inquiring about most recent lab results. Patient states she does not recall speaking with anyone regarding her labs.

## 2021-04-14 NOTE — Telephone Encounter (Signed)
returned call for lab results, while tryingto get to nurse patient. Hung up. Please call back

## 2021-04-14 NOTE — Telephone Encounter (Signed)
Lmtcb, PEC please advise on last lab results.

## 2021-04-14 NOTE — Telephone Encounter (Signed)
Returned call to mobile number listed and patient's sister answered and stated the patient was at work. Advised patient's sister to have the patient return the call when available. Most recent labs noted to be on 04/05/21.

## 2021-04-14 NOTE — Telephone Encounter (Signed)
Copied from CRM [id number]. Topic: General - Other >> Apr 14, 2021 11:47 AM Gaetana Michaelis A wrote: Reason for CRM: The patient would like to be contacted about their lab results when possible

## 2021-04-14 NOTE — Telephone Encounter (Signed)
Copied from CRM [id number]. Topic: Quick Communication - Lab Results (Clinic Use ONLY) >> Apr 14, 2021 12:45 PM Myles Lipps, [place] wrote: Called patient to inform them of lab results. When patient returns call, triage nurse may disclose results. >> Apr 14, 2021  4:17 PM Maye Hides wrote: Pt returning call for lab results.

## 2021-04-15 NOTE — Telephone Encounter (Signed)
Patient's sister advised of last lab results. She reports that Javeria has been feeling more cold than usual. She was concerned it had something to do with her hemoglobin. Please advise with any recommendations. She reports patient is taking OTC iron supplement daily.

## 2021-04-18 NOTE — Telephone Encounter (Signed)
It could be related, but hemoglobin is stable. Could try to increase iron supplement to twice daily if only taking once daily. Watch for constipation.

## 2021-04-18 NOTE — Telephone Encounter (Signed)
Patient's sister advised as below.  

## 2021-04-21 ENCOUNTER — Ambulatory Visit: Payer: Medicare Other | Admitting: Podiatry

## 2021-04-25 ENCOUNTER — Ambulatory Visit (INDEPENDENT_AMBULATORY_CARE_PROVIDER_SITE_OTHER): Payer: Medicare Other | Admitting: Podiatry

## 2021-04-25 ENCOUNTER — Encounter: Payer: Self-pay | Admitting: Podiatry

## 2021-04-25 ENCOUNTER — Other Ambulatory Visit: Payer: Self-pay

## 2021-04-25 DIAGNOSIS — E119 Type 2 diabetes mellitus without complications: Secondary | ICD-10-CM | POA: Diagnosis not present

## 2021-04-25 DIAGNOSIS — M7671 Peroneal tendinitis, right leg: Secondary | ICD-10-CM | POA: Insufficient documentation

## 2021-04-25 DIAGNOSIS — M79676 Pain in unspecified toe(s): Secondary | ICD-10-CM | POA: Diagnosis not present

## 2021-04-25 DIAGNOSIS — B351 Tinea unguium: Secondary | ICD-10-CM | POA: Diagnosis not present

## 2021-04-25 DIAGNOSIS — N182 Chronic kidney disease, stage 2 (mild): Secondary | ICD-10-CM | POA: Diagnosis not present

## 2021-04-25 NOTE — Progress Notes (Signed)
This patient returns to my office for at risk foot care.  This patient requires this care by a professional since this patient will be at risk due to having diabetes and chronic kidney disease.  This patient has painful area on the outside of her right  foot.  This is painful walking and wearing her shoes.. This patient presents for at risk foot care today.    General Appearance  Alert, conversant and in no acute stress.  Vascular  Dorsalis pedis and posterior tibial  pulses are palpable  bilaterally.  Capillary return is within normal limits  bilaterally. Temperature is within normal limits  bilaterally.  Neurologic  Senn-Weinstein monofilament wire test within normal limits  bilaterally. Muscle power within normal limits bilaterally.  Nails Thick disfigured discolored nails with subungual debris  from hallux to fifth toes bilaterally. No evidence of bacterial infection or drainage bilaterally.  Orthopedic  No limitations of motion  feet .  No crepitus or effusions noted.  No bony pathology or digital deformities noted. Palpable pain at peroneal tenson right foot.  Skin  normotropic skin with noted bilaterally.  No signs of infections or ulcers noted.     Onychomycosis  Peroneal tendinitis right foot.   Consent was obtained for treatment procedures.   Debridement of callus left foot with # 15 blade followed by dremel use.  Debride nails with nail nipper and dremel tool usage.   Return office visit  10 weeks                     Told patient to return for periodic foot care and evaluation due to potential at risk complications.   Helane Gunther DPM

## 2021-04-26 DIAGNOSIS — Z20822 Contact with and (suspected) exposure to covid-19: Secondary | ICD-10-CM | POA: Diagnosis not present

## 2021-05-13 NOTE — Progress Notes (Signed)
No show

## 2021-05-20 DIAGNOSIS — Z20822 Contact with and (suspected) exposure to covid-19: Secondary | ICD-10-CM | POA: Diagnosis not present

## 2021-05-27 DIAGNOSIS — Z20822 Contact with and (suspected) exposure to covid-19: Secondary | ICD-10-CM | POA: Diagnosis not present

## 2021-06-06 ENCOUNTER — Ambulatory Visit (INDEPENDENT_AMBULATORY_CARE_PROVIDER_SITE_OTHER): Payer: Medicare Other

## 2021-06-06 DIAGNOSIS — Z0000 Encounter for general adult medical examination without abnormal findings: Secondary | ICD-10-CM

## 2021-06-06 NOTE — Patient Instructions (Addendum)
Emma Fletcher , Thank you for taking time to come for your Medicare Wellness Visit. I appreciate your ongoing commitment to your health goals. Please review the following plan we discussed and let me know if I can assist you in the future.   Screening recommendations/referrals: Colonoscopy: 04/24/18 Mammogram: 12/01/20 Bone Density: no Recommended yearly ophthalmology/optometry visit for glaucoma screening and checkup Recommended yearly dental visit for hygiene and checkup  Vaccinations: Influenza vaccine: 04/05/21 Pneumococcal vaccine: 03/26/18 Tdap vaccine: 03/30/19 Shingles vaccine: n/d  Covid-19: 09/17/19, 10/15/19  Advanced directives: yes, copy requested  Conditions/risks identified: none  Next appointment: Follow up in one year for your annual wellness visit. 06/07/22 @ 10:20am  Preventive Care 40-64 Years, Female Preventive care refers to lifestyle choices and visits with your health care provider that can promote health and wellness. What does preventive care include? A yearly physical exam. This is also called an annual well check. Dental exams once or twice a year. Routine eye exams. Ask your health care provider how often you should have your eyes checked. Personal lifestyle choices, including: Daily care of your teeth and gums. Regular physical activity. Eating a healthy diet. Avoiding tobacco and drug use. Limiting alcohol use. Practicing safe sex. Taking low-dose aspirin daily starting at age 34. Taking vitamin and mineral supplements as recommended by your health care provider. What happens during an annual well check? The services and screenings done by your health care provider during your annual well check will depend on your age, overall health, lifestyle risk factors, and family history of disease. Counseling  Your health care provider may ask you questions about your: Alcohol use. Tobacco use. Drug use. Emotional well-being. Home and relationship  well-being. Sexual activity. Eating habits. Work and work Statistician. Method of birth control. Menstrual cycle. Pregnancy history. Screening  You may have the following tests or measurements: Height, weight, and BMI. Blood pressure. Lipid and cholesterol levels. These may be checked every 5 years, or more frequently if you are over 78 years old. Skin check. Lung cancer screening. You may have this screening every year starting at age 26 if you have a 30-pack-year history of smoking and currently smoke or have quit within the past 15 years. Fecal occult blood test (FOBT) of the stool. You may have this test every year starting at age 68. Flexible sigmoidoscopy or colonoscopy. You may have a sigmoidoscopy every 5 years or a colonoscopy every 10 years starting at age 63. Hepatitis C blood test. Hepatitis B blood test. Sexually transmitted disease (STD) testing. Diabetes screening. This is done by checking your blood sugar (glucose) after you have not eaten for a while (fasting). You may have this done every 1-3 years. Mammogram. This may be done every 1-2 years. Talk to your health care provider about when you should start having regular mammograms. This may depend on whether you have a family history of breast cancer. BRCA-related cancer screening. This may be done if you have a family history of breast, ovarian, tubal, or peritoneal cancers. Pelvic exam and Pap test. This may be done every 3 years starting at age 20. Starting at age 67, this may be done every 5 years if you have a Pap test in combination with an HPV test. Bone density scan. This is done to screen for osteoporosis. You may have this scan if you are at high risk for osteoporosis. Discuss your test results, treatment options, and if necessary, the need for more tests with your health care provider. Vaccines  Your  health care provider may recommend certain vaccines, such as: Influenza vaccine. This is recommended every  year. Tetanus, diphtheria, and acellular pertussis (Tdap, Td) vaccine. You may need a Td booster every 10 years. Zoster vaccine. You may need this after age 80. Pneumococcal 13-valent conjugate (PCV13) vaccine. You may need this if you have certain conditions and were not previously vaccinated. Pneumococcal polysaccharide (PPSV23) vaccine. You may need one or two doses if you smoke cigarettes or if you have certain conditions. Talk to your health care provider about which screenings and vaccines you need and how often you need them. This information is not intended to replace advice given to you by your health care provider. Make sure you discuss any questions you have with your health care provider. Document Released: 07/02/2015 Document Revised: 02/23/2016 Document Reviewed: 04/06/2015 Elsevier Interactive Patient Education  2017 [employer] Prevention in the Home Falls can cause injuries. They can happen to people of all ages. There are many things you can do to make your home safe and to help prevent falls. What can I do on the outside of my home? Regularly fix the edges of walkways and driveways and fix any cracks. Remove anything that might make you trip as you walk through a door, such as a raised step or threshold. Trim any bushes or trees on the path to your home. Use bright outdoor lighting. Clear any walking paths of anything that might make someone trip, such as rocks or tools. Regularly check to see if handrails are loose or broken. Make sure that both sides of any steps have handrails. Any raised decks and porches should have guardrails on the edges. Have any leaves, snow, or ice cleared regularly. Use sand or salt on walking paths during winter. Clean up any spills in your garage right away. This includes oil or grease spills. What can I do in the bathroom? Use night lights. Install grab bars by the toilet and in the tub and shower. Do not use towel bars as grab  bars. Use non-skid mats or decals in the tub or shower. If you need to sit down in the shower, use a plastic, non-slip stool. Keep the floor dry. Clean up any water that spills on the floor as soon as it happens. Remove soap buildup in the tub or shower regularly. Attach bath mats securely with double-sided non-slip rug tape. Do not have throw rugs and other things on the floor that can make you trip. What can I do in the bedroom? Use night lights. Make sure that you have a light by your bed that is easy to reach. Do not use any sheets or blankets that are too big for your bed. They should not hang down onto the floor. Have a firm chair that has side arms. You can use this for support while you get dressed. Do not have throw rugs and other things on the floor that can make you trip. What can I do in the kitchen? Clean up any spills right away. Avoid walking on wet floors. Keep items that you use a lot in easy-to-reach places. If you need to reach something above you, use a strong step stool that has a grab bar. Keep electrical cords out of the way. Do not use floor polish or wax that makes floors slippery. If you must use wax, use non-skid floor wax. Do not have throw rugs and other things on the floor that can make you trip. What can  I do with my stairs? Do not leave any items on the stairs. Make sure that there are handrails on both sides of the stairs and use them. Fix handrails that are broken or loose. Make sure that handrails are as long as the stairways. Check any carpeting to make sure that it is firmly attached to the stairs. Fix any carpet that is loose or worn. Avoid having throw rugs at the top or bottom of the stairs. If you do have throw rugs, attach them to the floor with carpet tape. Make sure that you have a light switch at the top of the stairs and the bottom of the stairs. If you do not have them, ask someone to add them for you. What else can I do to help prevent  falls? Wear shoes that: Do not have high heels. Have rubber bottoms. Are comfortable and fit you well. Are closed at the toe. Do not wear sandals. If you use a stepladder: Make sure that it is fully opened. Do not climb a closed stepladder. Make sure that both sides of the stepladder are locked into place. Ask someone to hold it for you, if possible. Clearly mark and make sure that you can see: Any grab bars or handrails. First and last steps. Where the edge of each step is. Use tools that help you move around (mobility aids) if they are needed. These include: Canes. Walkers. Scooters. Crutches. Turn on the lights when you go into a dark area. Replace any light bulbs as soon as they burn out. Set up your furniture so you have a clear path. Avoid moving your furniture around. If any of your floors are uneven, fix them. If there are any pets around you, be aware of where they are. Review your medicines with your doctor. Some medicines can make you feel dizzy. This can increase your chance of falling. Ask your doctor what other things that you can do to help prevent falls. This information is not intended to replace advice given to you by your health care provider. Make sure you discuss any questions you have with your health care provider. Document Released: 04/01/2009 Document Revised: 11/11/2015 Document Reviewed: 07/10/2014 Elsevier Interactive Patient Education  2017 [employer].

## 2021-06-06 NOTE — Progress Notes (Addendum)
Virtual Visit via Telephone Note  I connected with  Emma Fletcher on 06/06/21 at  9:40 AM EST by telephone and verified that I am speaking with the correct person using two identifiers.  Location: Patient: home Provider: [facility] Persons participating in the virtual visit: [place]   I discussed the limitations, risks, security and privacy concerns of performing an evaluation and management service by telephone and the availability of in person appointments. The patient expressed understanding and agreed to proceed.  Interactive audio and video telecommunications were attempted between this nurse and patient, however failed, due to patient having technical difficulties OR patient did not have access to video capability.  We continued and completed visit with audio only.  Some vital signs may be absent or patient reported.   Emma David, LPN  Subjective:   Emma Fletcher is a 65 y.o. female who presents for Medicare Annual (Subsequent) preventive examination.  Review of Systems     Cardiac Risk Factors include: diabetes mellitus     Objective:    There were no vitals filed for this visit. There is no height or weight on file to calculate BMI.  Advanced Directives 06/06/2021 05/31/2020 03/31/2019 07/02/2018 04/24/2018 10/19/2017 06/30/2017  Does Patient Have a Medical Advance Directive? _0  No Yes  Type of Paramedic of [place];Living will [place];Living will [place];Living will - [place];Living will - [place]  Does patient want to make changes to medical advance directive? Yes (Inpatient - patient defers changing a medical advance directive and declines information at this time) - - No - Patient declined - - No - Patient declined  Copy of [place] in Chart? Yes - validated most recent copy scanned in chart (See row  information) Yes - validated most recent copy scanned in chart (See row information) Yes - validated most recent copy scanned in chart (See row information) - - - No - copy requested  Would patient like information on creating a medical advance directive? - - - - - No - Patient declined -    Current Medications (verified) Outpatient Encounter Medications as of 06/06/2021  Medication Sig   atorvastatin (LIPITOR) 20 MG tablet Take 1 tablet (20 mg total) by mouth daily.   Blood Glucose Monitoring Suppl (ONE TOUCH ULTRA 2) w/Device KIT Use as directed to check blood sugar once daily   Cholecalciferol (VITAMIN D3) 125 MCG (5000 UT) CAPS Take 1 capsule (5,000 Units total) by mouth daily.   Ferrous Sulfate (IRON) 325 (65 Fe) MG TABS Take 2 tablets by mouth daily.   furosemide (LASIX) 20 MG tablet Take 1 tablet (20 mg total) by mouth daily.   glucose blood (ONE TOUCH ULTRA TEST) test strip Use as instructed to check blood glucose daily   lansoprazole (PREVACID) 30 MG capsule Take 1 capsule (30 mg total) by mouth daily.   levothyroxine (SYNTHROID) 100 MCG tablet TAKE 1 TABLET EVERY DAY   lisinopril (ZESTRIL) 20 MG tablet Take 1 tablet (20 mg total) by mouth daily.   metFORMIN (GLUCOPHAGE-XR) 500 MG 24 hr tablet Take 2 tablets (1,000 mg total) by mouth daily with breakfast.   Multiple Vitamins-Minerals (ONE-A-DAY WOMENS 50 PLUS PO) Take 1 tablet by mouth daily.   ONETOUCH DELICA LANCETS 83G MISC Use as directed to check blood glucose daily   polyethylene glycol powder (GLYCOLAX/MIRALAX) 17 GM/SCOOP powder Please use one cup of  miralax daily.   potassium chloride (KLOR-CON) 10 MEQ tablet Take 1 tablet (10 mEq total) by mouth 2 (two) times daily.   Semaglutide, 1 MG/DOSE, 4 MG/3ML SOPN Inject 1 mg as directed once a week.   SPIRIVA HANDIHALER 18 MCG inhalation capsule INHALE THE CONTENTS OF 1 CAPSULE EVERY DAY AS DIRECTED   SYMBICORT 160-4.5 MCG/ACT inhaler INHALE 2 PUFFS TWICE DAILY   vitamin B-12  (CYANOCOBALAMIN) 1000 MCG tablet Take 1,000 mcg by mouth daily.   neomycin-polymyxin-hydrocortisone (CORTISPORIN) OTIC solution Place 3 drops into the left ear 3 (three) times daily. (Patient not taking: Reported on 06/06/2021)   No facility-administered encounter medications on file as of 06/06/2021.    Allergies (verified) Patient has no known allergies.   History: Past Medical History:  Diagnosis Date   Anemia    Bronchitis    Cardiomyopathy secondary    Developmental delay    Diabetes mellitus without complication ([place])    Edema    Hypercholesterolemia    Unspecified essential hypertension    Past Surgical History:  Procedure Laterality Date   COLONOSCOPY     COLONOSCOPY WITH PROPOFOL N/A 04/24/2018   Procedure: COLONOSCOPY WITH PROPOFOL;  Surgeon: Jonathon Bellows, MD;  Location: [facility] ENDOSCOPY;  Service: Gastroenterology;  Laterality: N/A;   none     Family History  Problem Relation Age of Onset   Diabetes Mother    Hypertension Mother    Stroke Mother    Diabetes Father    Coronary artery disease Father    Breast cancer Maternal Aunt    Lung cancer Maternal Uncle    Prostate cancer Paternal Uncle    Uterine cancer Other    Social History   Socioeconomic History   Marital status: Single    Spouse name: Not on file   Number of children: 0   Years of education: Not on file   Highest education level: High school graduate  Occupational History   Occupation: Part time works in [place] Use   Smoking status: Never   Smokeless tobacco: Never  Vaping Use   Vaping Use: Never used  Substance and Sexual Activity   Alcohol use: No    Alcohol/week: 0.0 standard drinks   Drug use: No   Sexual activity: Not on file  Other Topics Concern   Not on file  Social History Narrative   Not on file   Social Determinants of Health   Financial Resource Strain: Low Risk    Difficulty of Paying Living Expenses: Not hard at all  Food Insecurity: No Food Insecurity    Worried About Charity fundraiser in the Last Year: Never true   [place] in the Last Year: Never true  Transportation Needs: No Transportation Needs   Lack of Transportation (Medical): No   Lack of Transportation (Non-Medical): No  Physical Activity: Insufficiently Active   Days of Exercise per Week: 2 days   Minutes of Exercise per Session: 20 min  Stress: No Stress Concern Present   Feeling of Stress : Not at all  Social Connections: Moderately Isolated   Frequency of Communication with Friends and Family: More than three times a week   Frequency of Social Gatherings with Friends and Family: More than three times a week   Attends Religious Services: More than 4 times per year   Active Member of [employer] or Organizations: No   Attends Archivist Meetings: Never   Marital Status: Never married    Tobacco Counseling  Counseling given: Not Answered   Clinical Intake:  Pre-visit preparation completed: Yes  Pain : No/denies pain     Nutritional Risks: None Diabetes: Yes CBG done?: No Did pt. bring in CBG monitor from home?: No  How often do you need to have someone help you when you read instructions, pamphlets, or other written materials from your doctor or pharmacy?: 1 - Never  Diabetic?yes Nutrition Risk Assessment:  Has the patient had any N/V/D within the last 2 months?  No  Does the patient have any non-healing wounds?  No  Has the patient had any unintentional weight loss or weight gain?  No   Diabetes:  Is the patient diabetic?  Yes  If diabetic, was a CBG obtained today?  No  Did the patient bring in their glucometer from home?  No  How often do you monitor your CBG's? Once per week.   Financial Strains and Diabetes Management:  Are you having any financial strains with the device, your supplies or your medication? No .  Does the patient want to be seen by Chronic Care Management for management of their diabetes?  No  Would the patient  like to be referred to a Nutritionist or for Diabetic Management?  No   Diabetic Exams:  Diabetic Eye Exam: Completed 04/04/21 (negataive retinopathy). .  Diabetic Foot Exam: Completed 06/07/20. Pt has been advised about the importance in completing this exam.Interpreter Needed?: No  Information entered by :: Kirke Shaggy, LPN   Activities of Daily Living In your present state of health, do you have any difficulty performing the following activities: 06/06/2021 11/01/2020  Hearing? Tempie Donning  Vision? N Y  Difficulty concentrating or making decisions? N Y  Walking or climbing stairs? N N  Dressing or bathing? N N  Doing errands, shopping? N Y  Conservation officer, nature and eating ? N -  Using the Toilet? N -  In the past six months, have you accidently leaked urine? N -  Do you have problems with loss of bowel control? N -  Managing your Medications? N -  Managing your Finances? N -  Housekeeping or managing your Housekeeping? N -  Some recent data might be hidden    Patient Care Team: Bacigalupo, Dionne Bucy, MD as PCP - General (Family Medicine) Gardiner Barefoot, DPM as Consulting Physician (Podiatry) Virgel Manifold, MD as Consulting Physician (Gastroenterology) Pa, Sam Rayburn (Optometry)  Indicate any recent Medical Services you may have received from other than [facility] providers in the past year (date may be approximate).     Assessment:   This is a routine wellness examination for [place].  Hearing/Vision screen No results found.  Dietary issues and exercise activities discussed: Current Exercise Habits: The patient does not participate in regular exercise at present, Exercise limited by: None identified   Goals Addressed             This Visit's Progress    DIET - EAT MORE FRUITS AND VEGETABLES         Depression Screen PHQ 2/9 Scores 06/06/2021 11/01/2020 04/12/2020 03/31/2019 03/26/2018 03/26/2018 09/20/2017  PHQ - 2 Score 0 0 0 0 0 0 0  PHQ- 9 Score - 0 0 - 0 - -     Fall Risk Fall Risk  06/06/2021 11/01/2020 05/31/2020 04/12/2020 03/31/2019  Falls in the past year? 1 0 1 1 0  Number falls in past yr: 0 0 0 0 0  Injury with Fall? 0 0 0 0 0  Risk for fall due to : History of fall(s) - - - -  Follow up Falls prevention discussed - Falls prevention discussed Falls evaluation completed -    FALL RISK PREVENTION PERTAINING TO THE HOME:  Any stairs in or around the home? Yes  If so, are there any without handrails? No  Home free of loose throw rugs in walkways, pet beds, electrical cords, etc? Yes  Adequate lighting in your home to reduce risk of falls? Yes   ASSISTIVE DEVICES UTILIZED TO PREVENT FALLS:  Life alert? No  Use of a cane, walker or w/c? No  Grab bars in the bathroom? No  Shower chair or bench in shower? No  Elevated toilet seat or a handicapped toilet? No    Cognitive Function:Normal cognitive status assessed by direct observation by this Nurse Health Advisor. No abnormalities found.       6CIT Screen 05/31/2020 03/31/2019  What Year? 0 points 0 points  What month? 3 points 0 points  What time? 0 points 0 points  Count back from 20 0 points 0 points  Months in reverse 4 points 0 points  Repeat phrase 4 points 2 points  Total Score 11 2    Immunizations Immunization History  Administered Date(s) Administered   Influenza,inj,Quad PF,6+ Mos 03/26/2018, 04/05/2021   Influenza-Unspecified 03/30/2019, 03/19/2020   Moderna Sars-Covid-2 Vaccination 09/17/2019, 10/15/2019   Pneumococcal Polysaccharide-23 03/26/2018   Tdap 10/20/2010, 03/30/2019    TDAP status: Due, Education has been provided regarding the importance of this vaccine. Advised may receive this vaccine at local pharmacy or Health Dept. Aware to provide a copy of the vaccination record if obtained from local pharmacy or Health Dept. Verbalized acceptance and understanding.  Flu Vaccine status: Up to date  Pneumococcal vaccine status: Up to date  Covid-19  vaccine status: Completed vaccines  Qualifies for Shingles Vaccine? Yes   Zostavax completed No   Shingrix Completed?: No.    Education has been provided regarding the importance of this vaccine. Patient has been advised to call insurance company to determine out of pocket expense if they have not yet received this vaccine. Advised may also receive vaccine at local pharmacy or Health Dept. Verbalized acceptance and understanding.  Screening Tests Health Maintenance  Topic Date Due   Zoster Vaccines- Shingrix (1 of 2) Never done   Pneumococcal Vaccine 54-49 Years old (2 - PCV) 03/27/2019   COVID-19 Vaccine (3 - Moderna risk series) 11/12/2019   PAP SMEAR-Modifier  03/26/2021   FOOT EXAM  06/07/2021   HEMOGLOBIN A1C  10/04/2021   OPHTHALMOLOGY EXAM  04/04/2022   MAMMOGRAM  12/02/2022   COLONOSCOPY (Pts 45-95yr Insurance coverage will need to be confirmed)  04/24/2028   TETANUS/TDAP  03/29/2029   INFLUENZA VACCINE  Completed   Hepatitis C Screening  Completed   HIV Screening  Completed   HPV VACCINES  Aged Out    Health Maintenance  Health Maintenance Due  Topic Date Due   Zoster Vaccines- Shingrix (1 of 2) Never done   Pneumococcal Vaccine 123659Years old (2 - PCV) 03/27/2019   COVID-19 Vaccine (3 - Moderna risk series) 11/12/2019   PAP SMEAR-Modifier  03/26/2021    Colorectal cancer screening: Type of screening: Colonoscopy. Completed 04/24/18. Repeat every 10 years  Mammogram status: Completed 12/01/20. Repeat every year   Lung Cancer Screening: (Low Dose CT Chest recommended if Age 64-80years, 30 pack-year currently smoking OR have quit w/in 15years.) does not qualify.    Additional Screening:  Hepatitis C Screening: does qualify; Completed 09/25/17  Vision Screening: Recommended annual ophthalmology exams for early detection of glaucoma and other disorders of the eye. Is the patient up to date with their annual eye exam?  Yes  Who is the provider or what is the name  of the office in which the patient attends annual eye exams? [facility] If pt is not established with a provider, would they like to be referred to a provider to establish care? No .   Dental Screening: Recommended annual dental exams for proper oral hygiene  Community Resource Referral / Chronic Care Management: CRR required this visit?  No   CCM required this visit?  No      Plan:     I have personally reviewed and noted the following in the patients chart:   Medical and social history Use of alcohol, tobacco or illicit drugs  Current medications and supplements including opioid prescriptions.  Functional ability and status Nutritional status Physical activity Advanced directives List of other physicians Hospitalizations, surgeries, and ER visits in previous 12 months Vitals Screenings to include cognitive, depression, and falls Referrals and appointments  In addition, I have reviewed and discussed with patient certain preventive protocols, quality metrics, and best practice recommendations. A written personalized care plan for preventive services as well as general preventive health recommendations were provided to patient.     Emma David, LPN   [id number]/10/6977   Nurse Notes: none    I have reviewed the health advisor's note, was available for consultation, and agree with documentation and plan  Bacigalupo, Dionne Bucy, MD, MPH [facility] 06/09/2021 8:13 AM

## 2021-06-18 ENCOUNTER — Other Ambulatory Visit: Payer: Self-pay | Admitting: Family Medicine

## 2021-06-18 NOTE — Telephone Encounter (Signed)
Requested Prescriptions  Pending Prescriptions Disp Refills   levothyroxine (SYNTHROID) 100 MCG tablet [Pharmacy Med Name: LEVOTHYROXINE SODIUM 100 MCG Tablet] 90 tablet 2    Sig: TAKE 1 TABLET EVERY DAY     Endocrinology:  Hypothyroid Agents Failed - 06/18/2021 12:55 AM      Failed - TSH needs to be rechecked within 3 months after an abnormal result. Refill until TSH is due.      Passed - TSH in normal range and within 360 days    TSH  Date Value Ref Range Status  04/05/2021 1.310 0.450 - 4.500 uIU/mL Final         Passed - Valid encounter within last 12 months    Recent Outpatient Visits          2 months ago Type 2 diabetes mellitus with other specified complication, without long-term current use of insulin [facility])   [facility] [place], Marzella Schlein, MD   5 months ago Hypertension associated with diabetes [facility])   [facility], Marzella Schlein, MD   7 months ago No-show for appointment   [facility] Chrismon, Jodell Cipro, PA-C   11 months ago Post-viral cough syndrome   [facility], Marzella Schlein, MD   1 year ago Acute otitis externa of left ear, unspecified type   [facility] Flinchum, Eula Fried, FNP      Future Appointments            In 1 month Bacigalupo, Marzella Schlein, MD [facility], [facility]

## 2021-06-27 DIAGNOSIS — Z20822 Contact with and (suspected) exposure to covid-19: Secondary | ICD-10-CM | POA: Diagnosis not present

## 2021-07-04 ENCOUNTER — Other Ambulatory Visit: Payer: Self-pay | Admitting: Family Medicine

## 2021-07-04 NOTE — Telephone Encounter (Signed)
Requested Prescriptions  Pending Prescriptions Disp Refills   SPIRIVA HANDIHALER 18 MCG inhalation capsule [Pharmacy Med Name: SPIRIVA HANDIHALER 18 MCG Capsule] 90 capsule 0    Sig: INHALE THE CONTENTS OF 1 CAPSULE EVERY DAY AS DIRECTED     Pulmonology:  Anticholinergic Agents Passed - 07/04/2021  6:38 PM      Passed - Valid encounter within last 12 months    Recent Outpatient Visits          3 months ago Type 2 diabetes mellitus with other specified complication, without long-term current use of insulin [facility])   [facility], Marzella Schlein, MD   6 months ago Hypertension associated with diabetes [facility])   [facility], Marzella Schlein, MD   8 months ago No-show for appointment   [facility] Chrismon, Jodell Cipro, PA-C   12 months ago Post-viral cough syndrome   [facility], Marzella Schlein, MD   1 year ago Acute otitis externa of left ear, unspecified type   [facility] Flinchum, Eula Fried, FNP      Future Appointments            In 1 week Bacigalupo, Marzella Schlein, MD [facility], [facility]            SYMBICORT 160-4.5 MCG/ACT inhaler [Pharmacy Med Name: SYMBICORT 160-4.5 MCG/ACT Aerosol] 3 each 0    Sig: INHALE 2 PUFFS TWICE DAILY     Pulmonology:  Combination Products Passed - 07/04/2021  6:38 PM      Passed - Valid encounter within last 12 months    Recent Outpatient Visits          3 months ago Type 2 diabetes mellitus with other specified complication, without long-term current use of insulin [facility])   [facility] [place], Marzella Schlein, MD   6 months ago Hypertension associated with diabetes [facility])   [facility], Marzella Schlein, MD   8 months ago No-show for appointment   [facility] Chrismon, Jodell Cipro, PA-C   12 months ago Post-viral cough syndrome   [facility], Marzella Schlein, MD   1 year ago Acute otitis  externa of left ear, unspecified type   [facility] Flinchum, Eula Fried, FNP      Future Appointments            In 1 week Bacigalupo, Marzella Schlein, MD [facility], [facility]

## 2021-07-08 NOTE — Progress Notes (Signed)
Established patient visit   Patient: Emma Fletcher   DOB: [birth year]   66 y.o. Female  MRN: [id number] Visit Date: 07/11/2021  Today's healthcare provider: Lavon Paganini, MD   Chief Complaint  Patient presents with   Diabetes   Hypertension   Subjective    HPI  Diabetes Mellitus Type II, Follow-up  Lab Results  Component Value Date   HGBA1C 6.9 (A) 07/11/2021   HGBA1C 6.9 (A) 04/05/2021   HGBA1C 7.2 (A) 12/28/2020   Wt Readings from Last 3 Encounters:  07/11/21 184 lb (83.5 kg)  12/28/20 206 lb 6.4 oz (93.6 kg)  11/01/20 207 lb (93.9 kg)   Last seen for diabetes 3 months ago.  Management since then includes continue current treatment. She reports good compliance with treatment. She is not having side effects.  Symptoms: No fatigue No foot ulcerations  No appetite changes No nausea  No paresthesia of the feet  No polydipsia  No polyuria No visual disturbances   No vomiting     Home blood sugar records:  120's-140's  Episodes of hypoglycemia? No   Most Recent Eye Exam: UTD   Pertinent Labs: Lab Results  Component Value Date   CHOL 145 04/05/2021   HDL 63 04/05/2021   LDLCALC 66 04/05/2021   TRIG 86 04/05/2021   CHOLHDL 2.3 04/05/2021   Lab Results  Component Value Date   NA 132 (L) 04/05/2021   K 5.0 04/05/2021   CREATININE 0.81 04/05/2021   EGFR 81 04/05/2021     ---------------------------------------------------------------------------------------------------  Hypertension: Patient here for follow-up of elevated blood pressure. She is exercising and is adherent to low salt diet.    BP Readings from Last 3 Encounters:  07/11/21 132/75  04/05/21 122/60  12/28/20 140/72     Medications: Outpatient Medications Prior to Visit  Medication Sig   atorvastatin (LIPITOR) 20 MG tablet Take 1 tablet (20 mg total) by mouth daily.   Blood Glucose Monitoring Suppl (ONE TOUCH ULTRA 2) w/Device KIT Use as directed to check blood sugar once  daily   Cholecalciferol (VITAMIN D3) 125 MCG (5000 UT) CAPS Take 1 capsule (5,000 Units total) by mouth daily.   Ferrous Sulfate (IRON) 325 (65 Fe) MG TABS Take 2 tablets by mouth daily.   furosemide (LASIX) 20 MG tablet Take 1 tablet (20 mg total) by mouth daily.   glucose blood (ONE TOUCH ULTRA TEST) test strip Use as instructed to check blood glucose daily   lansoprazole (PREVACID) 30 MG capsule Take 1 capsule (30 mg total) by mouth daily.   levothyroxine (SYNTHROID) 100 MCG tablet TAKE 1 TABLET EVERY DAY   lisinopril (ZESTRIL) 20 MG tablet Take 1 tablet (20 mg total) by mouth daily.   metFORMIN (GLUCOPHAGE-XR) 500 MG 24 hr tablet Take 2 tablets (1,000 mg total) by mouth daily with breakfast.   Multiple Vitamins-Minerals (ONE-A-DAY WOMENS 50 PLUS PO) Take 1 tablet by mouth daily.   ONETOUCH DELICA LANCETS 23F MISC Use as directed to check blood glucose daily   polyethylene glycol powder (GLYCOLAX/MIRALAX) 17 GM/SCOOP powder Please use one cup of miralax daily.   potassium chloride (KLOR-CON) 10 MEQ tablet Take 1 tablet (10 mEq total) by mouth 2 (two) times daily.   Semaglutide, 1 MG/DOSE, 4 MG/3ML SOPN Inject 1 mg as directed once a week.   SPIRIVA HANDIHALER 18 MCG inhalation capsule INHALE THE CONTENTS OF 1 CAPSULE EVERY DAY AS DIRECTED   SYMBICORT 160-4.5 MCG/ACT inhaler INHALE 2 PUFFS TWICE DAILY  vitamin B-12 (CYANOCOBALAMIN) 1000 MCG tablet Take 1,000 mcg by mouth daily.   [DISCONTINUED] neomycin-polymyxin-hydrocortisone (CORTISPORIN) OTIC solution Place 3 drops into the left ear 3 (three) times daily. (Patient not taking: Reported on 06/06/2021)   No facility-administered medications prior to visit.    Review of Systems per HPI     Objective    BP 132/75 Comment: home reading   Pulse 68    Temp 98.6 F (37 C) (Oral)    Wt 184 lb (83.5 kg)    LMP  (LMP Unknown)    SpO2 100%    BMI 33.65 kg/m  {Show previous vital signs (optional):23777}  Physical Exam Vitals reviewed.   Constitutional:      General: She is not in acute distress.    Appearance: Normal appearance. She is well-developed. She is not diaphoretic.  HENT:     Head: Normocephalic and atraumatic.  Eyes:     General: No scleral icterus.    Conjunctiva/sclera: Conjunctivae normal.  Neck:     Thyroid: No thyromegaly.  Cardiovascular:     Rate and Rhythm: Normal rate and regular rhythm.     Pulses: Normal pulses.     Heart sounds: Normal heart sounds. No murmur heard. Pulmonary:     Effort: Pulmonary effort is normal. No respiratory distress.     Breath sounds: Normal breath sounds. No wheezing, rhonchi or rales.  Musculoskeletal:     Cervical back: Neck supple.     Right lower leg: No edema.     Left lower leg: No edema.  Lymphadenopathy:     Cervical: No cervical adenopathy.  Skin:    General: Skin is warm and dry.     Findings: No rash.  Neurological:     Mental Status: She is alert and oriented to person, place, and time. Mental status is at baseline.  Psychiatric:        Mood and Affect: Mood normal.        Behavior: Behavior normal.      Results for orders placed or performed in visit on 07/11/21  POCT glycosylated hemoglobin (Hb A1C)  Result Value Ref Range   Hemoglobin A1C 6.9 (A) 4.0 - 5.6 %   HbA1c POC (<> result, manual entry)     HbA1c, POC (prediabetic range)     HbA1c, POC (controlled diabetic range)      Assessment & Plan     Problem List Items Addressed This Visit       Cardiovascular and Mediastinum   Hypertension associated with diabetes ([place]) - Primary    Well controlled on home readings Continue current medications Recheck metabolic panel F/u in 3 months       Relevant Orders   Comprehensive metabolic panel   Secondary cardiomyopathy ([place])    F/b cards Continue to control BP well        Respiratory   Chronic obstructive pulmonary disease (HCC)    Chronic and stable Continue spiriva, albuterol prn        Endocrine   T2DM (type 2  diabetes mellitus) ([place])    Well controlled No changes to medicines May need to consider decreasing dose of Ozempic for postprandial vomiting as below in the future Discussed diet and exercise UTD on screenings F/u in 63m     Relevant Orders   POCT glycosylated hemoglobin (Hb A1C) (Completed)   Hyperlipidemia associated with type 2 diabetes mellitus ([place]    Previously well controlled Continue statin      Hypothyroidism  Previously well controlled Continue Synthroid at current dose  Recheck TSH and adjust Synthroid as indicated        Relevant Orders   TSH     Genitourinary   CKD (chronic kidney disease), stage II    Chronic and stable Recheck metabolic panel Avoid nephrotoxic meds         Other   Anemia of chronic disease    Continue to monitor CBC and B12 q82m     Relevant Orders   B12   CBC   Obesity    Congratulated on weight loss - is intentional Discussed importance of healthy weight management Discussed diet and exercise       Postprandial abdominal pain in right upper quadrant    Longstanding, but newly discussed Concern for GERD vs gallbladder pathology vs Ozempic reaction Patient has done well on Ozempic and does not want to change currently, but consider decreasing in the future RUQ [place] evaluate gallbladder Keep symptom/diet journal      Relevant Orders   [place]Abdomen Limited RUQ (LIVER/GB)   Comprehensive metabolic panel   Other Visit Diagnoses     Long-term use of high-risk medication       Relevant Orders   B12        Return in about 3 months (around 10/09/2021) for chronic disease f/u.      HPI, Exam and A&P Transcribed under the direction and in the presence of ALavon Paganini MD. Electronically Signed: EAlthea Charon RMA  I, ALavon Paganini MD, have reviewed all documentation for this visit. The documentation on 07/11/21 for the exam, diagnosis, procedures, and orders are all accurate and complete.   Ewa Hipp,  ADionne Bucy MD, MPH [facility]

## 2021-07-11 ENCOUNTER — Encounter: Payer: Self-pay | Admitting: Family Medicine

## 2021-07-11 ENCOUNTER — Ambulatory Visit (INDEPENDENT_AMBULATORY_CARE_PROVIDER_SITE_OTHER): Payer: Medicare Other | Admitting: Family Medicine

## 2021-07-11 ENCOUNTER — Other Ambulatory Visit: Payer: Self-pay

## 2021-07-11 VITALS — BP 132/75 | HR 68 | Temp 98.6°F | Wt 184.0 lb

## 2021-07-11 DIAGNOSIS — J449 Chronic obstructive pulmonary disease, unspecified: Secondary | ICD-10-CM | POA: Diagnosis not present

## 2021-07-11 DIAGNOSIS — N182 Chronic kidney disease, stage 2 (mild): Secondary | ICD-10-CM | POA: Diagnosis not present

## 2021-07-11 DIAGNOSIS — E785 Hyperlipidemia, unspecified: Secondary | ICD-10-CM | POA: Diagnosis not present

## 2021-07-11 DIAGNOSIS — Z79899 Other long term (current) drug therapy: Secondary | ICD-10-CM

## 2021-07-11 DIAGNOSIS — Z6833 Body mass index (BMI) 33.0-33.9, adult: Secondary | ICD-10-CM

## 2021-07-11 DIAGNOSIS — I152 Hypertension secondary to endocrine disorders: Secondary | ICD-10-CM | POA: Diagnosis not present

## 2021-07-11 DIAGNOSIS — E1169 Type 2 diabetes mellitus with other specified complication: Secondary | ICD-10-CM

## 2021-07-11 DIAGNOSIS — E669 Obesity, unspecified: Secondary | ICD-10-CM | POA: Insufficient documentation

## 2021-07-11 DIAGNOSIS — I429 Cardiomyopathy, unspecified: Secondary | ICD-10-CM

## 2021-07-11 DIAGNOSIS — E1159 Type 2 diabetes mellitus with other circulatory complications: Secondary | ICD-10-CM | POA: Diagnosis not present

## 2021-07-11 DIAGNOSIS — E66811 Obesity, class 1: Secondary | ICD-10-CM

## 2021-07-11 DIAGNOSIS — E039 Hypothyroidism, unspecified: Secondary | ICD-10-CM | POA: Diagnosis not present

## 2021-07-11 DIAGNOSIS — R1011 Right upper quadrant pain: Secondary | ICD-10-CM

## 2021-07-11 DIAGNOSIS — E66812 Obesity, class 2: Secondary | ICD-10-CM | POA: Insufficient documentation

## 2021-07-11 DIAGNOSIS — D638 Anemia in other chronic diseases classified elsewhere: Secondary | ICD-10-CM

## 2021-07-11 DIAGNOSIS — E663 Overweight: Secondary | ICD-10-CM | POA: Insufficient documentation

## 2021-07-11 LAB — POCT GLYCOSYLATED HEMOGLOBIN (HGB A1C): Hemoglobin A1C: 6.9 % — AB (ref 4.0–5.6)

## 2021-07-11 NOTE — Assessment & Plan Note (Signed)
Previously well controlled Continue statin 

## 2021-07-11 NOTE — Assessment & Plan Note (Signed)
Chronic and stable Recheck metabolic panel Avoid nephrotoxic meds  

## 2021-07-11 NOTE — Assessment & Plan Note (Signed)
Well controlled on home readings Continue current medications Recheck metabolic panel F/u in 3 months

## 2021-07-11 NOTE — Assessment & Plan Note (Signed)
F/b cards Continue to control BP well

## 2021-07-11 NOTE — Assessment & Plan Note (Signed)
Previously well controlled Continue Synthroid at current dose  Recheck TSH and adjust Synthroid as indicated   

## 2021-07-11 NOTE — Assessment & Plan Note (Signed)
Chronic and stable Continue spiriva, albuterol prn

## 2021-07-11 NOTE — Assessment & Plan Note (Signed)
Longstanding, but newly discussed Concern for GERD vs gallbladder pathology vs Ozempic reaction Patient has done well on Ozempic and does not want to change currently, but consider decreasing in the future RUQ [place] to evaluate gallbladder Keep symptom/diet journal

## 2021-07-11 NOTE — Assessment & Plan Note (Signed)
Well controlled No changes to medicines May need to consider decreasing dose of Ozempic for postprandial vomiting as below in the future Discussed diet and exercise UTD on screenings F/u in 57m

## 2021-07-11 NOTE — Assessment & Plan Note (Signed)
Continue to monitor CBC and B12 q53m

## 2021-07-11 NOTE — Assessment & Plan Note (Signed)
Congratulated on weight loss - is intentional Discussed importance of healthy weight management Discussed diet and exercise

## 2021-07-12 LAB — COMPREHENSIVE METABOLIC PANEL
ALT: 16 IU/L (ref 0–32)
AST: 16 IU/L (ref 0–40)
Albumin/Globulin Ratio: 2 (ref 1.2–2.2)
Albumin: 4.6 g/dL (ref 3.8–4.8)
Alkaline Phosphatase: 133 IU/L — ABNORMAL HIGH (ref 44–121)
BUN/Creatinine Ratio: 16 (ref 12–28)
BUN: 12 mg/dL (ref 8–27)
Bilirubin Total: 0.4 mg/dL (ref 0.0–1.2)
CO2: 23 mmol/L (ref 20–29)
Calcium: 10 mg/dL (ref 8.7–10.3)
Chloride: 95 mmol/L — ABNORMAL LOW (ref 96–106)
Creatinine, Ser: 0.74 mg/dL (ref 0.57–1.00)
Globulin, Total: 2.3 g/dL (ref 1.5–4.5)
Glucose: 184 mg/dL — ABNORMAL HIGH (ref 70–99)
Potassium: 4.6 mmol/L (ref 3.5–5.2)
Sodium: 135 mmol/L (ref 134–144)
Total Protein: 6.9 g/dL (ref 6.0–8.5)
eGFR: 90 mL/min/{1.73_m2} (ref >59)

## 2021-07-12 LAB — CBC
Hematocrit: 33.1 % — ABNORMAL LOW (ref 34.0–46.6)
Hemoglobin: 11.3 g/dL (ref 11.1–15.9)
MCH: 30.5 pg (ref 26.6–33.0)
MCHC: 34.1 g/dL (ref 31.5–35.7)
MCV: 90 fL (ref 79–97)
Platelets: 281 10*3/uL (ref 150–450)
RBC: 3.7 x10E6/uL — ABNORMAL LOW (ref 3.77–5.28)
RDW: 11.7 % (ref 11.7–15.4)
WBC: 6.5 10*3/uL (ref 3.4–10.8)

## 2021-07-12 LAB — TSH: TSH: 0.824 u[IU]/mL (ref 0.450–4.500)

## 2021-07-12 LAB — VITAMIN B12: Vitamin B-12: 1082 pg/mL (ref 232–1245)

## 2021-07-13 ENCOUNTER — Ambulatory Visit
Admission: RE | Admit: 2021-07-13 | Discharge: 2021-07-13 | Disposition: A | Discharge status: Home / Self Care | Payer: Medicare Other | Source: Ambulatory Visit | Attending: Family Medicine | Admitting: Family Medicine

## 2021-07-13 ENCOUNTER — Telehealth: Payer: Self-pay

## 2021-07-13 DIAGNOSIS — R1011 Right upper quadrant pain: Secondary | ICD-10-CM | POA: Insufficient documentation

## 2021-07-13 DIAGNOSIS — R1110 Vomiting, unspecified: Secondary | ICD-10-CM | POA: Diagnosis not present

## 2021-07-13 NOTE — Telephone Encounter (Signed)
-----   Message from Virginia Crews, MD sent at 07/13/2021  1:46 PM EST ----- Small gallbladder polyp, but nothing to explain current symptoms. Recommend decreasing ozempic dose to 0.5 mg weekly to see if this will help. Please send new Rx if patient agrees (it's a different pen).

## 2021-07-13 NOTE — Telephone Encounter (Signed)
Patient sister said patient has a 4 month supply on Ozempic and she is wondering that instead of sending the new prescription can she still use the same one for her sister and instead of putting the pen to 1 mg can she just put it to the half line in between? Feels that way she can only give half?

## 2021-07-13 NOTE — Telephone Encounter (Signed)
It's difficult to give it on the 1mg  pens, you have to count the lines, but if they want to do that it's ok

## 2021-07-14 NOTE — Telephone Encounter (Signed)
I called patient's sister June Watson and advised her of Dr. Senaida Lange message below. June says she is going to try putting the dose between the line on the pen. She administers the medication on Sunday. June agrees to call back to the office on Monday to let us know if she had any difficulty drawing up the dose.

## 2021-07-18 ENCOUNTER — Other Ambulatory Visit: Payer: Self-pay

## 2021-07-18 ENCOUNTER — Ambulatory Visit (INDEPENDENT_AMBULATORY_CARE_PROVIDER_SITE_OTHER): Payer: Medicare Other | Admitting: Podiatry

## 2021-07-18 ENCOUNTER — Encounter: Payer: Self-pay | Admitting: Podiatry

## 2021-07-18 DIAGNOSIS — E119 Type 2 diabetes mellitus without complications: Secondary | ICD-10-CM | POA: Diagnosis not present

## 2021-07-18 DIAGNOSIS — M79676 Pain in unspecified toe(s): Secondary | ICD-10-CM

## 2021-07-18 DIAGNOSIS — L84 Corns and callosities: Secondary | ICD-10-CM

## 2021-07-18 DIAGNOSIS — B351 Tinea unguium: Secondary | ICD-10-CM

## 2021-07-18 DIAGNOSIS — M7671 Peroneal tendinitis, right leg: Secondary | ICD-10-CM | POA: Diagnosis not present

## 2021-07-18 DIAGNOSIS — N182 Chronic kidney disease, stage 2 (mild): Secondary | ICD-10-CM

## 2021-07-18 NOTE — Progress Notes (Signed)
This patient returns to my office for at risk foot care.  This patient requires this care by a professional since this patient will be at risk due to having diabetes and chronic kidney disease.  This patient has painful area on the outside of her right  foot.  This is painful walking and wearing her shoes.. This patient presents for at risk foot care today.  Patient says she is experiencing pain at the base fifth metatarsal.  Swelling and pain noted right foot.  General Appearance  Alert, conversant and in no acute stress.  Vascular  Dorsalis pedis and posterior tibial  pulses are palpable  bilaterally.  Capillary return is within normal limits  bilaterally. Temperature is within normal limits  bilaterally.  Neurologic  Senn-Weinstein monofilament wire test within normal limits  bilaterally. Muscle power within normal limits bilaterally.  Nails Thick disfigured discolored nails with subungual debris  from hallux to fifth toes bilaterally. No evidence of bacterial infection or drainage bilaterally.  Orthopedic  No limitations of motion  feet .  No crepitus or effusions noted.  No bony pathology or digital deformities noted. Palpable pain at peroneal tenson right foot.  Dispense anklet doe right foot swelling and pain since patient cannot take medicine or wear a surgical shoes.  Skin  normotropic skin with noted bilaterally.  No signs of infections or ulcers noted.     Onychomycosis  Peroneal tendinitis right foot.   Consent was obtained for treatment procedures.   Debridement of callus left foot with # 15 blade followed by dremel use.  Debride nails with nail nipper and dremel tool usage.  Dispense anklet.  Discussed peroneal pain with this patient.   Return office visit  10 weeks                     Told patient to return for periodic foot care and evaluation due to potential at risk complications.   Helane Gunther DPM

## 2021-07-25 ENCOUNTER — Ambulatory Visit: Payer: Medicare Other | Admitting: Family Medicine

## 2021-07-27 DIAGNOSIS — Z20822 Contact with and (suspected) exposure to covid-19: Secondary | ICD-10-CM | POA: Diagnosis not present

## 2021-08-01 ENCOUNTER — Ambulatory Visit: Payer: Medicare Other | Admitting: Podiatry

## 2021-08-03 DIAGNOSIS — Z20822 Contact with and (suspected) exposure to covid-19: Secondary | ICD-10-CM | POA: Diagnosis not present

## 2021-08-10 DIAGNOSIS — Z20828 Contact with and (suspected) exposure to other viral communicable diseases: Secondary | ICD-10-CM | POA: Diagnosis not present

## 2021-08-17 DIAGNOSIS — Z20822 Contact with and (suspected) exposure to covid-19: Secondary | ICD-10-CM | POA: Diagnosis not present

## 2021-09-03 DIAGNOSIS — Z20822 Contact with and (suspected) exposure to covid-19: Secondary | ICD-10-CM | POA: Diagnosis not present

## 2021-09-05 DIAGNOSIS — Z20822 Contact with and (suspected) exposure to covid-19: Secondary | ICD-10-CM | POA: Diagnosis not present

## 2021-09-20 DIAGNOSIS — Z20822 Contact with and (suspected) exposure to covid-19: Secondary | ICD-10-CM | POA: Diagnosis not present

## 2021-09-21 DIAGNOSIS — I447 Left bundle-branch block, unspecified: Secondary | ICD-10-CM | POA: Diagnosis not present

## 2021-09-21 DIAGNOSIS — Z20822 Contact with and (suspected) exposure to covid-19: Secondary | ICD-10-CM | POA: Diagnosis not present

## 2021-09-21 DIAGNOSIS — I471 Supraventricular tachycardia: Secondary | ICD-10-CM | POA: Diagnosis not present

## 2021-09-21 DIAGNOSIS — I350 Nonrheumatic aortic (valve) stenosis: Secondary | ICD-10-CM | POA: Diagnosis not present

## 2021-09-21 DIAGNOSIS — I10 Essential (primary) hypertension: Secondary | ICD-10-CM | POA: Diagnosis not present

## 2021-09-21 DIAGNOSIS — I5032 Chronic diastolic (congestive) heart failure: Secondary | ICD-10-CM | POA: Diagnosis not present

## 2021-09-26 ENCOUNTER — Encounter: Payer: Self-pay | Admitting: Podiatry

## 2021-09-26 ENCOUNTER — Ambulatory Visit (INDEPENDENT_AMBULATORY_CARE_PROVIDER_SITE_OTHER): Payer: Medicare Other | Admitting: Podiatry

## 2021-09-26 DIAGNOSIS — B351 Tinea unguium: Secondary | ICD-10-CM | POA: Diagnosis not present

## 2021-09-26 DIAGNOSIS — E119 Type 2 diabetes mellitus without complications: Secondary | ICD-10-CM | POA: Diagnosis not present

## 2021-09-26 DIAGNOSIS — M79676 Pain in unspecified toe(s): Secondary | ICD-10-CM

## 2021-09-26 DIAGNOSIS — N182 Chronic kidney disease, stage 2 (mild): Secondary | ICD-10-CM

## 2021-09-26 DIAGNOSIS — Z20822 Contact with and (suspected) exposure to covid-19: Secondary | ICD-10-CM | POA: Diagnosis not present

## 2021-09-26 NOTE — Progress Notes (Signed)
This patient returns to my office for at risk foot care.  This patient requires this care by a professional since this patient will be at risk due to having diabetes and chronic kidney disease.  This patient has painful area on the outside of her right  foot.  This is painful walking and wearing her shoes.. This patient presents for at risk foot care today.  Patient says she is experiencing pain at the base fifth metatarsal.  Swelling and pain noted right foot. ? ?General Appearance  Alert, conversant and in no acute stress. ? ?Vascular  Dorsalis pedis and posterior tibial  pulses are palpable  bilaterally.  Capillary return is within normal limits  bilaterally. Temperature is within normal limits  bilaterally. ? ?Neurologic  Senn-Weinstein monofilament wire test within normal limits  bilaterally. Muscle power within normal limits bilaterally. ? ?Nails Thick disfigured discolored nails with subungual debris  from hallux to fifth toes bilaterally. No evidence of bacterial infection or drainage bilaterally. ? ?Orthopedic  No limitations of motion  feet .  No crepitus or effusions noted.  No bony pathology or digital deformities noted.   ? ?Skin  normotropic skin with noted bilaterally.  No signs of infections or ulcers noted.    ? ?Onychomycosis  Peroneal tendinitis right foot. ? ? ?Consent was obtained for treatment procedures.   Debridement of callus left foot with # 15 blade followed by dremel use.  Debride nails with nail nipper and dremel tool usage.  Dispense anklet.  Discussed peroneal pain with this patient. Told her to continue wearing ankle anklet right foot. ? ? ?Return office visit  10 weeks                     Told patient to return for periodic foot care and evaluation due to potential at risk complications. ? ? ?Helane Gunther DPM  ?

## 2021-10-06 DIAGNOSIS — Z20822 Contact with and (suspected) exposure to covid-19: Secondary | ICD-10-CM | POA: Diagnosis not present

## 2021-10-07 DIAGNOSIS — Z20828 Contact with and (suspected) exposure to other viral communicable diseases: Secondary | ICD-10-CM | POA: Diagnosis not present

## 2021-10-17 ENCOUNTER — Encounter: Payer: Self-pay | Admitting: Family Medicine

## 2021-10-17 ENCOUNTER — Ambulatory Visit: Payer: Medicare Other | Admitting: Family Medicine

## 2021-10-17 ENCOUNTER — Ambulatory Visit (INDEPENDENT_AMBULATORY_CARE_PROVIDER_SITE_OTHER): Payer: Medicare Other | Admitting: Family Medicine

## 2021-10-17 VITALS — BP 130/70 | HR 75 | Temp 97.7°F | Resp 16 | Wt 185.0 lb

## 2021-10-17 DIAGNOSIS — E785 Hyperlipidemia, unspecified: Secondary | ICD-10-CM | POA: Diagnosis not present

## 2021-10-17 DIAGNOSIS — N182 Chronic kidney disease, stage 2 (mild): Secondary | ICD-10-CM | POA: Diagnosis not present

## 2021-10-17 DIAGNOSIS — E1159 Type 2 diabetes mellitus with other circulatory complications: Secondary | ICD-10-CM | POA: Diagnosis not present

## 2021-10-17 DIAGNOSIS — E1169 Type 2 diabetes mellitus with other specified complication: Secondary | ICD-10-CM

## 2021-10-17 DIAGNOSIS — E669 Obesity, unspecified: Secondary | ICD-10-CM

## 2021-10-17 DIAGNOSIS — E039 Hypothyroidism, unspecified: Secondary | ICD-10-CM | POA: Diagnosis not present

## 2021-10-17 DIAGNOSIS — Z6833 Body mass index (BMI) 33.0-33.9, adult: Secondary | ICD-10-CM

## 2021-10-17 DIAGNOSIS — I152 Hypertension secondary to endocrine disorders: Secondary | ICD-10-CM

## 2021-10-17 DIAGNOSIS — J0100 Acute maxillary sinusitis, unspecified: Secondary | ICD-10-CM | POA: Diagnosis not present

## 2021-10-17 DIAGNOSIS — Z20822 Contact with and (suspected) exposure to covid-19: Secondary | ICD-10-CM | POA: Diagnosis not present

## 2021-10-17 MED ORDER — SEMAGLUTIDE (1 MG/DOSE) 4 MG/3ML ~~LOC~~ SOPN: 1.0000 mg | PEN_INJECTOR | SUBCUTANEOUS | 3 refills | Status: DC

## 2021-10-17 MED ORDER — AMOXICILLIN-POT CLAVULANATE 875-125 MG PO TABS: 1.0000 | ORAL_TABLET | Freq: Two times a day (BID) | ORAL | 0 refills | Status: AC

## 2021-10-17 NOTE — Assessment & Plan Note (Signed)
Previously well controlled ?Reviewed last TSH ?Continue Synthroid at current dose ?Recheck at next visit ?

## 2021-10-17 NOTE — Assessment & Plan Note (Signed)
Well controlled Continue current medications Reviewed recent metabolic panel 

## 2021-10-17 NOTE — Assessment & Plan Note (Signed)
Discussed importance of healthy weight management Discussed diet and exercise  

## 2021-10-17 NOTE — Assessment & Plan Note (Signed)
Previously well controlled ?Recheck A1c ?Associated with/complicated by HTN and HLD ?Vomiting has improved with decreased Ozempic dose ?No changes to medications today pending A1c ?Up-to-date on screenings, except U ACR today ?

## 2021-10-17 NOTE — Progress Notes (Signed)
?  ? ? ?Established patient visit ? ? ?Patient: Emma Fletcher   DOB: [birth year]   66 y.o. Female  MRN: [id number] ?Visit Date: 10/17/2021 ? ?Today's healthcare provider: Lavon Paganini, MD  ? ?Chief Complaint  ?Patient presents with  ? follow-up DM and HTN  ? ?Subjective  ?  ?HPI  ?Hypertension, follow-up ? ?BP Readings from Last 3 Encounters:  ?10/17/21 130/70  ?07/11/21 132/75  ?04/05/21 122/60  ? Wt Readings from Last 3 Encounters:  ?10/17/21 185 lb (83.9 kg)  ?07/11/21 184 lb (83.5 kg)  ?12/28/20 206 lb 6.4 oz (93.6 kg)  ?  ? ?She was last seen for hypertension 3 months ago.  ?BP at that visit was 132/75. Management since that visit includes continue current medications. ? ?She reports excellent compliance with treatment. ?She is not having side effects.  ?She is following a Regular diet. ?She is exercising. Walking. ? ?Outside blood pressures are 120-130/77. ?Symptoms: ?No chest pain No chest pressure  ?No palpitations No syncope  ?No dyspnea No orthopnea  ?No paroxysmal nocturnal dyspnea No lower extremity edema  ? ?Pertinent labs ?Lab Results  ?Component Value Date  ? CHOL 145 04/05/2021  ? HDL 63 04/05/2021  ? [place] 66 04/05/2021  ? TRIG 86 04/05/2021  ? CHOLHDL 2.3 04/05/2021  ? Lab Results  ?Component Value Date  ? NA 135 07/11/2021  ? K 4.6 07/11/2021  ? CREATININE 0.74 07/11/2021  ? EGFR 90 07/11/2021  ? GLUCOSE 184 (H) 07/11/2021  ? TSH 0.824 07/11/2021  ?  ? ?The 10-year ASCVD risk score (Arnett DK, et al., 2019) is: 10.2% ? ?---------------------------------------------------------------------------------------------------  ?Diabetes Mellitus Type II, Follow-up ? ?Lab Results  ?Component Value Date  ? HGBA1C 6.9 (A) 07/11/2021  ? HGBA1C 6.9 (A) 04/05/2021  ? HGBA1C 7.2 (A) 12/28/2020  ? ?Wt Readings from Last 3 Encounters:  ?10/17/21 185 lb (83.9 kg)  ?07/11/21 184 lb (83.5 kg)  ?12/28/20 206 lb 6.4 oz (93.6 kg)  ? ?Last seen for diabetes 3 months ago.  ?Management since then includes no  changes to medicine. ?She reports excellent compliance with treatment. ?She is not having side effects.  ?Symptoms: ?No fatigue No foot ulcerations  ?No appetite changes No nausea  ?No paresthesia of the feet  No polydipsia  ?No polyuria No visual disturbances   ?No vomiting   ? ? ?Home blood sugar records: fasting range: 114-130 ? ?Episodes of hypoglycemia? No  ?  ?Current insulin regiment: none ?Most Recent Eye Exam: 03/2021 ? ? ?Pertinent Labs: ?Lab Results  ?Component Value Date  ? CHOL 145 04/05/2021  ? HDL 63 04/05/2021  ? [place] 66 04/05/2021  ? TRIG 86 04/05/2021  ? CHOLHDL 2.3 04/05/2021  ? Lab Results  ?Component Value Date  ? NA 135 07/11/2021  ? K 4.6 07/11/2021  ? CREATININE 0.74 07/11/2021  ? EGFR 90 07/11/2021  ?  ? ?---------------------------------------------------------------------------------------------------  ? ?Sinus congestion and drainage x1-2 weeks.  Sister was sick with similar symptoms and got bronchitis.  +cough, non productive. Tried mucinex and it is helping. ? ? ?Medications: ?Outpatient Medications Prior to Visit  ?Medication Sig  ? atorvastatin (LIPITOR) 20 MG tablet Take 1 tablet (20 mg total) by mouth daily.  ? Blood Glucose Monitoring Suppl (ONE TOUCH ULTRA 2) w/Device KIT Use as directed to check blood sugar once daily  ? Cholecalciferol (VITAMIN D3) 125 MCG (5000 UT) CAPS Take 1 capsule (5,000 Units total) by mouth daily.  ? Ferrous Sulfate (IRON) 325 (65 Fe)  MG TABS Take 2 tablets by mouth daily.  ? furosemide (LASIX) 20 MG tablet Take 1 tablet (20 mg total) by mouth daily.  ? glucose blood (ONE TOUCH ULTRA TEST) test strip Use as instructed to check blood glucose daily  ? lansoprazole (PREVACID) 30 MG capsule Take 1 capsule (30 mg total) by mouth daily.  ? levothyroxine (SYNTHROID) 100 MCG tablet TAKE 1 TABLET EVERY DAY  ? lisinopril (ZESTRIL) 20 MG tablet Take 1 tablet (20 mg total) by mouth daily.  ? metFORMIN (GLUCOPHAGE-XR) 500 MG 24 hr tablet Take 2 tablets (1,000 mg  total) by mouth daily with breakfast.  ? Multiple Vitamins-Minerals (ONE-A-DAY WOMENS 50 PLUS PO) Take 1 tablet by mouth daily.  ? ONETOUCH DELICA LANCETS 91Y MISC Use as directed to check blood glucose daily  ? polyethylene glycol powder (GLYCOLAX/MIRALAX) 17 GM/SCOOP powder Please use one cup of miralax daily.  ? potassium chloride (KLOR-CON) 10 MEQ tablet Take 1 tablet (10 mEq total) by mouth 2 (two) times daily.  ? SPIRIVA HANDIHALER 18 MCG inhalation capsule INHALE THE CONTENTS OF 1 CAPSULE EVERY DAY AS DIRECTED  ? SYMBICORT 160-4.5 MCG/ACT inhaler INHALE 2 PUFFS TWICE DAILY  ? vitamin B-12 (CYANOCOBALAMIN) 1000 MCG tablet Take 1,000 mcg by mouth daily.  ? [DISCONTINUED] Semaglutide, 1 MG/DOSE, 4 MG/3ML SOPN Inject 1 mg as directed once a week.  ? ?No facility-administered medications prior to visit.  ? ? ?Review of Systems per HPI ? ? ?  Objective  ?  ?BP 130/70 (BP Location: Right Arm, Patient Position: Sitting, Cuff Size: Large)   Pulse 75   Temp 97.7 ?F (36.5 ?C) (Oral)   Resp 16   Wt 185 lb (83.9 kg)   LMP  (LMP Unknown)   SpO2 99%   BMI 33.84 kg/m?  ? ? ?Physical Exam ?Vitals reviewed.  ?Constitutional:   ?   General: She is not in acute distress. ?   Appearance: Normal appearance. She is well-developed. She is not diaphoretic.  ?HENT:  ?   Head: Normocephalic and atraumatic.  ?   Right Ear: External ear normal.  ?   Left Ear: External ear normal.  ?   Nose: Congestion present.  ?   Comments: TTP over b/l maxillary sinuses ?   Mouth/Throat:  ?   Mouth: Mucous membranes are moist.  ?   Pharynx: Oropharynx is clear. No oropharyngeal exudate.  ?Eyes:  ?   General: No scleral icterus. ?   Conjunctiva/sclera: Conjunctivae normal.  ?Neck:  ?   Thyroid: No thyromegaly.  ?Cardiovascular:  ?   Rate and Rhythm: Normal rate and regular rhythm.  ?   Pulses: Normal pulses.  ?   Heart sounds: Normal heart sounds. No murmur heard. ?Pulmonary:  ?   Effort: Pulmonary effort is normal. No respiratory distress.  ?    Breath sounds: Normal breath sounds. No wheezing, rhonchi or rales.  ?Musculoskeletal:  ?   Cervical back: Neck supple.  ?   Right lower leg: No edema.  ?   Left lower leg: No edema.  ?Lymphadenopathy:  ?   Cervical: No cervical adenopathy.  ?Skin: ?   General: Skin is warm and dry.  ?   Findings: No rash.  ?Neurological:  ?   Mental Status: She is alert and oriented to person, place, and time. Mental status is at baseline.  ?Psychiatric:     ?   Mood and Affect: Mood normal.     ?   Behavior: Behavior normal.  ?  ? ? ?  No results found for any visits on 10/17/21. ? Assessment & Plan  ?  ? ?Problem List Items Addressed This Visit   ? ?  ? Cardiovascular and Mediastinum  ? Hypertension associated with diabetes ([place])  ?  Well controlled ?Continue current medications ?Reviewed recent metabolic panel ?  ?  ? Relevant Medications  ? Semaglutide, 1 MG/DOSE, 4 MG/3ML SOPN  ?  ? Endocrine  ? T2DM (type 2 diabetes mellitus) ([place]) - Primary  ?  Previously well controlled ?Recheck A1c ?Associated with/complicated by HTN and HLD ?Vomiting has improved with decreased Ozempic dose ?No changes to medications today pending A1c ?Up-to-date on screenings, except U ACR today ? ?  ?  ? Relevant Medications  ? Semaglutide, 1 MG/DOSE, 4 MG/3ML SOPN  ? Other Relevant Orders  ? Hemoglobin A1c  ? Microalbumin / creatinine urine ratio  ? Hyperlipidemia associated with type 2 diabetes mellitus ([place])  ?  Previously well controlled ?Continue statin ?Reviewed last lipid panel ?Recheck at next visit ? ?  ?  ? Relevant Medications  ? Semaglutide, 1 MG/DOSE, 4 MG/3ML SOPN  ? Hypothyroidism  ?  Previously well controlled ?Reviewed last TSH ?Continue Synthroid at current dose ?Recheck at next visit ? ?  ?  ?  ? Genitourinary  ? CKD (chronic kidney disease), stage II  ?  Chronic and stable ?Avoid nephrotoxic meds ?Recheck metabolic panel at next visit ? ?  ?  ?  ? Other  ? Obesity  ?  Discussed importance of healthy weight management ?Discussed diet  and exercise  ?  ?  ? Relevant Medications  ? Semaglutide, 1 MG/DOSE, 4 MG/3ML SOPN  ? ?Other Visit Diagnoses   ? ? Acute non-recurrent maxillary sinusitis      ? Relevant Medications  ? amoxicillin-clavulanate (AUGME

## 2021-10-17 NOTE — Assessment & Plan Note (Signed)
Previously well controlled ?Continue statin ?Reviewed last lipid panel ?Recheck at next visit ?

## 2021-10-17 NOTE — Assessment & Plan Note (Signed)
Chronic and stable ?Avoid nephrotoxic meds ?Recheck metabolic panel at next visit ?

## 2021-10-18 LAB — HEMOGLOBIN A1C
Est. average glucose Bld gHb Est-mCnc: 157 mg/dL
Hgb A1c MFr Bld: 7.1 % — ABNORMAL HIGH (ref 4.8–5.6)

## 2021-10-18 LAB — MICROALBUMIN / CREATININE URINE RATIO
Creatinine, Urine: 64 mg/dL
Microalb/Creat Ratio: 48 mg/g creat — ABNORMAL HIGH (ref 0–29)
Microalbumin, Urine: 30.5 ug/mL

## 2021-10-24 DIAGNOSIS — Z20822 Contact with and (suspected) exposure to covid-19: Secondary | ICD-10-CM | POA: Diagnosis not present

## 2021-10-26 ENCOUNTER — Other Ambulatory Visit: Payer: Self-pay | Admitting: Family Medicine

## 2021-10-26 DIAGNOSIS — Z1231 Encounter for screening mammogram for malignant neoplasm of breast: Secondary | ICD-10-CM

## 2021-11-15 ENCOUNTER — Ambulatory Visit: Payer: Self-pay | Admitting: *Deleted

## 2021-11-15 ENCOUNTER — Emergency Department: Payer: Medicare Other

## 2021-11-15 ENCOUNTER — Emergency Department
Admission: EM | Admit: 2021-11-15 | Discharge: 2021-11-15 | Disposition: A | Discharge status: Home / Self Care | Payer: Medicare Other | Attending: Emergency Medicine | Admitting: Emergency Medicine

## 2021-11-15 ENCOUNTER — Other Ambulatory Visit: Payer: Self-pay

## 2021-11-15 DIAGNOSIS — M79641 Pain in right hand: Secondary | ICD-10-CM | POA: Diagnosis not present

## 2021-11-15 DIAGNOSIS — E119 Type 2 diabetes mellitus without complications: Secondary | ICD-10-CM | POA: Insufficient documentation

## 2021-11-15 DIAGNOSIS — J449 Chronic obstructive pulmonary disease, unspecified: Secondary | ICD-10-CM | POA: Insufficient documentation

## 2021-11-15 DIAGNOSIS — M25531 Pain in right wrist: Secondary | ICD-10-CM | POA: Diagnosis not present

## 2021-11-15 DIAGNOSIS — S0990XA Unspecified injury of head, initial encounter: Secondary | ICD-10-CM | POA: Diagnosis not present

## 2021-11-15 DIAGNOSIS — E039 Hypothyroidism, unspecified: Secondary | ICD-10-CM | POA: Insufficient documentation

## 2021-11-15 DIAGNOSIS — M542 Cervicalgia: Secondary | ICD-10-CM | POA: Insufficient documentation

## 2021-11-15 DIAGNOSIS — W19XXXA Unspecified fall, initial encounter: Secondary | ICD-10-CM

## 2021-11-15 DIAGNOSIS — R519 Headache, unspecified: Secondary | ICD-10-CM | POA: Insufficient documentation

## 2021-11-15 DIAGNOSIS — M79621 Pain in right upper arm: Secondary | ICD-10-CM | POA: Insufficient documentation

## 2021-11-15 DIAGNOSIS — S6991XA Unspecified injury of right wrist, hand and finger(s), initial encounter: Secondary | ICD-10-CM | POA: Diagnosis not present

## 2021-11-15 DIAGNOSIS — M19041 Primary osteoarthritis, right hand: Secondary | ICD-10-CM | POA: Diagnosis not present

## 2021-11-15 DIAGNOSIS — N182 Chronic kidney disease, stage 2 (mild): Secondary | ICD-10-CM | POA: Insufficient documentation

## 2021-11-15 DIAGNOSIS — I129 Hypertensive chronic kidney disease with stage 1 through stage 4 chronic kidney disease, or unspecified chronic kidney disease: Secondary | ICD-10-CM | POA: Insufficient documentation

## 2021-11-15 DIAGNOSIS — I6523 Occlusion and stenosis of bilateral carotid arteries: Secondary | ICD-10-CM | POA: Diagnosis not present

## 2021-11-15 DIAGNOSIS — S199XXA Unspecified injury of neck, initial encounter: Secondary | ICD-10-CM | POA: Diagnosis not present

## 2021-11-15 NOTE — Discharge Instructions (Addendum)
-  Take Tylenol/ibuprofen as needed for pain.  Recommend icing the hand/wrist for the first 48 hours.  -You may keep the wrist brace on for comfort as needed.  -Follow-up with the orthopedist listed above if your symptoms fail to improve.  -Return to the emergency department anytime if you begin to experience any new or worsening symptoms

## 2021-11-15 NOTE — ED Provider Triage Note (Signed)
  Emergency Medicine Provider Triage Evaluation Note  Emma Fletcher , a 66 y.o.female,  was evaluated in triage.  Pt complains of injury sustained from mechanical fall.  States that she fell on her right side.  Currently endorsing headache, neck pain, upper arm pain, and right hand pain.  Review of Systems  Positive: Headache, arm pain, hand pain Negative: Denies fever, chest pain, vomiting  Physical Exam   Vitals:   11/15/21 1718  BP: (!) 158/92  Pulse: 89  Resp: 18  Temp: 99.1 F (37.3 C)  SpO2: 100%   Gen:   Awake, no distress   Resp:  Normal effort  MSK:   Moves extremities without difficulty  Other:    Medical Decision Making  Given the patient's initial medical screening exam, the following diagnostic evaluation has been ordered. The patient will be placed in the appropriate treatment space, once one is available, to complete the evaluation and treatment. I have discussed the plan of care with the patient and I have advised the patient that an ED physician or mid-level practitioner will reevaluate their condition after the test results have been received, as the results may give them additional insight into the type of treatment they may need.    Diagnostics: Head CT, cervical spine CT, x-ray of the humerus/right hand  Treatments: none immediately   Varney Daily, [place] 11/15/21 1720

## 2021-11-15 NOTE — Telephone Encounter (Signed)
  Chief Complaint: fell, hit head and shoulder Symptoms: knot on head, can't lift arm all the way up Frequency: na Pertinent Negatives: Patient denies loss of consciousness, tripped. Disposition: [x] ED /[] Urgent Care (no appt availability in office) / [] Appointment(In office/virtual)/ []  [facility]/ [] Home Care/ [] Refused Recommended Disposition /[] [facility]/ []  Follow-up with PCP Additional Notes: Pt has knot larger than golf ball on her head and is unable to lift arm all the way up. Tripped and fell coming out of a restaurant. [facility] ED per family.  Reason for Disposition  Injury (or injuries) that need emergency care  Answer Assessment - Initial Assessment Questions 1. MECHANISM: "How did the fall happen?"     Tripped and hit head and shoulder 2. DOMESTIC VIOLENCE AND ELDER ABUSE SCREENING: "Did you fall because someone pushed you or tried to hurt you?" If Yes, ask: "Are you safe now?"     no 3. ONSET: "When did the fall happen?" (e.g., minutes, hours, or days ago)     This afternoon, one hours 4. LOCATION: "What part of the body hit the ground?" (e.g., back, buttocks, head, hips, knees, hands, head, stomach)     Head and shoulders 5. INJURY: "Did you hurt (injure) yourself when you fell?" If Yes, ask: "What did you injure? Tell me more about this?" (e.g., body area; type of injury; pain severity)"     yes 6. PAIN: "Is there any pain?" If Yes, ask: "How bad is the pain?" (e.g., Scale 1-10; or mild,  moderate, severe)   - NONE (0): No pain   - MILD (1-3): Doesn't interfere with normal activities    - MODERATE (4-7): Interferes with normal activities or awakens from sleep    - SEVERE (8-10): Excruciating pain, unable to do any normal activities      7 7. SIZE: For cuts, bruises, or swelling, ask: "How large is it?" (e.g., inches or centimeters)      No cut 8. PREGNANCY: "Is there any chance you are pregnant?" "When was your last menstrual period?"     na 9.  OTHER SYMPTOMS: "Do you have any other symptoms?" (e.g., dizziness, fever, weakness; new onset or worsening).      no 10. CAUSE: "What do you think caused the fall (or falling)?" (e.g., tripped, dizzy spell)       Tripped.  Protocols used: Falls and [facility]

## 2021-11-15 NOTE — ED Notes (Addendum)
Pt had a fall and hit her right arm. Pt also hit her head

## 2021-11-15 NOTE — ED Triage Notes (Signed)
Pt comes wth c/o fall. Pt states arm, head and right hand. Pt states trip and fall.

## 2021-11-15 NOTE — ED Provider Notes (Signed)
[facility] Provider Note    Event Date/Time   First MD Initiated Contact with Patient 11/15/21 1937     (approximate)   History   Chief Complaint Fall   HPI STEFHANIE Fletcher is a 66 y.o. female, history of hypertension, type 2 diabetes, hyponatremia, CKD stage II, hyperlipidemia, hypothyroidism, COPD, obesity, presents to the emergency department for evaluation of injury sustained from fall.  Patient reports mechanical fall after tripping over her foot.  She states that she fell on her right side.  She is currently endorsing pain in her right hand/wrist, upper arm, neck, and has a mild headache.  Denies LOC.  No blood thinner use.  Denies fever/chills, chest pain, shortness of breath, abdominal pain, vision changes, flank pain, nausea/vomiting, diarrhea, dysuria, numbness/tingling upper or lower extremities, or dizziness/lightheadedness.  History Limitations: No limitations        Physical Exam  Triage Vital Signs: ED Triage Vitals [11/15/21 1718]  Enc Vitals Group     BP (!) 158/92     Pulse Rate 89     Resp 18     Temp 99.1 F (37.3 C)     Temp src      SpO2 100 %     Weight      Height      Head Circumference      Peak Flow      Pain Score 5     Pain Loc      Pain Edu?      Excl. in [place]?     Most recent vital signs: Vitals:   11/15/21 1718  BP: (!) 158/92  Pulse: 89  Resp: 18  Temp: 99.1 F (37.3 C)  SpO2: 100%    General: Awake, NAD.  Skin: Warm, dry. No rashes or lesions.  Eyes: PERRL. Conjunctivae normal.  CV: Good peripheral perfusion.  Resp: Normal effort.  Abd: Soft, non-tender. No distention.  Neuro: At baseline. No gross neurological deficits.   Focused Exam: Small not appreciated along the right side of the patient's head.  No bleeding or discharge  Mild swelling along the right hand/wrist.  No significant bony tenderness.  Patient maintains normal range of motion of the digits, hand, and wrist.  Pulse, motor,  sensation intact distally.  No snuffbox tenderness.  No gross deformities to the right upper arm.  She endorses some bony tenderness.  She is able to abduct and abduct her arm normally.  No significant cervical spine tenderness.  Patient maintains normal range of motion of the head and neck.  Physical Exam    ED Results / Procedures / Treatments  Labs (all labs ordered are listed, but only abnormal results are displayed) Labs Reviewed - No data to display   EKG N/A   RADIOLOGY  ED Provider Interpretation: I personally viewed these images.  Head CT shows no acute intracranial abnormalities.  CT cervical spine shows no fractures or misalignment.  Humerus x-ray shows no acute fractures.  And x-ray shows no acute fractures  CT Head Wo Contrast  Result Date: 11/15/2021 CLINICAL DATA:  Head trauma, moderate-severe; Neck trauma (Age >= 65y) EXAM: CT HEAD WITHOUT CONTRAST CT CERVICAL SPINE WITHOUT CONTRAST TECHNIQUE: Multidetector CT imaging of the head and cervical spine was performed following the standard protocol without intravenous contrast. Multiplanar CT image reconstructions of the cervical spine were also generated. RADIATION DOSE REDUCTION: This exam was performed according to the departmental dose-optimization program which includes automated exposure control, adjustment of the  mA and/or kV according to patient size and/or use of iterative reconstruction technique. COMPARISON:  None Available. FINDINGS: CT HEAD FINDINGS Brain: No evidence of large-territorial acute infarction. No parenchymal hemorrhage. No mass lesion. No extra-axial collection. No mass effect or midline shift. No hydrocephalus. Basilar cisterns are patent. Vascular: No hyperdense vessel. Atherosclerotic calcifications are present within the cavernous internal carotid arteries. Skull: No acute fracture or focal lesion. Sinuses/Orbits: Paranasal sinuses and mastoid air cells are clear. The orbits are unremarkable. Other:  None. CT CERVICAL SPINE FINDINGS Alignment: Normal. Skull base and vertebrae: Multilevel mild-to-moderate degenerative changes spine with no associated severe osseous neural foraminal or central canal stenosis. No acute fracture. No aggressive appearing focal osseous lesion or focal pathologic process. Soft tissues and spinal canal: No prevertebral fluid or swelling. No visible canal hematoma. Upper chest: Unremarkable. Other: Subcentimeter thyroid gland hypodensity. Not clinically significant; no follow-up imaging recommended (ref: J Am Coll Radiol. 2015 Feb;12(2): 143-50). IMPRESSION: 1. No acute intracranial abnormality. 2. No acute displaced fracture or traumatic listhesis of the cervical spine. Electronically Signed   By: Iven Finn M.D.   On: 11/15/2021 17:39   CT Cervical Spine Wo Contrast  Result Date: 11/15/2021 CLINICAL DATA:  Head trauma, moderate-severe; Neck trauma (Age >= 65y) EXAM: CT HEAD WITHOUT CONTRAST CT CERVICAL SPINE WITHOUT CONTRAST TECHNIQUE: Multidetector CT imaging of the head and cervical spine was performed following the standard protocol without intravenous contrast. Multiplanar CT image reconstructions of the cervical spine were also generated. RADIATION DOSE REDUCTION: This exam was performed according to the departmental dose-optimization program which includes automated exposure control, adjustment of the mA and/or kV according to patient size and/or use of iterative reconstruction technique. COMPARISON:  None Available. FINDINGS: CT HEAD FINDINGS Brain: No evidence of large-territorial acute infarction. No parenchymal hemorrhage. No mass lesion. No extra-axial collection. No mass effect or midline shift. No hydrocephalus. Basilar cisterns are patent. Vascular: No hyperdense vessel. Atherosclerotic calcifications are present within the cavernous internal carotid arteries. Skull: No acute fracture or focal lesion. Sinuses/Orbits: Paranasal sinuses and mastoid air cells are  clear. The orbits are unremarkable. Other: None. CT CERVICAL SPINE FINDINGS Alignment: Normal. Skull base and vertebrae: Multilevel mild-to-moderate degenerative changes spine with no associated severe osseous neural foraminal or central canal stenosis. No acute fracture. No aggressive appearing focal osseous lesion or focal pathologic process. Soft tissues and spinal canal: No prevertebral fluid or swelling. No visible canal hematoma. Upper chest: Unremarkable. Other: Subcentimeter thyroid gland hypodensity. Not clinically significant; no follow-up imaging recommended (ref: J Am Coll Radiol. 2015 Feb;12(2): 143-50). IMPRESSION: 1. No acute intracranial abnormality. 2. No acute displaced fracture or traumatic listhesis of the cervical spine. Electronically Signed   By: Iven Finn M.D.   On: 11/15/2021 17:39   DG Humerus Right  Result Date: 11/15/2021 CLINICAL DATA:  Pain after fall EXAM: RIGHT HUMERUS - 2+ VIEW COMPARISON:  None Available. FINDINGS: There is no evidence of fracture or other focal bone lesions. Soft tissues are unremarkable. IMPRESSION: No acute osseous abnormality. Electronically Signed   By: Dahlia Bailiff M.D.   On: 11/15/2021 17:49   DG Hand Complete Right  Result Date: 11/15/2021 CLINICAL DATA:  Trauma, fall, pain EXAM: RIGHT HAND - COMPLETE 3+ VIEW COMPARISON:  None Available. FINDINGS: No fracture or dislocation is seen. Degenerative changes are noted with bony spurs in multiple interphalangeal joints. There is mild deformity in the head of second metacarpal with subcortical cysts, possibly due to degenerative arthritis. Degenerative changes with small bony spurs  seen in first carpometacarpal and first metacarpophalangeal joints. There are no opaque foreign bodies. IMPRESSION: No recent fracture is seen. Degenerative changes are noted in multiple joints as described in the body of the report. Electronically Signed   By: Elmer Picker M.D.   On: 11/15/2021 17:50     PROCEDURES:  Critical Care performed: N/A.  Procedures    MEDICATIONS ORDERED IN ED: Medications - No data to display   IMPRESSION / MDM / [place] / ED COURSE  I reviewed the triage vital signs and the nursing notes.                              Differential diagnosis includes, but is not limited to, metacarpal fracture, carpal fracture, distal radius/ulna fracture, cervical spine fracture, epidural/subdural hematoma, hand/wrist sprain.  ED Course Patient appears well, vitals within normal limits for the patient.  NAD.  Assessment/Plan Patient presents with injury sustained from mechanical fall.  She denies any preceding symptoms.  Physical exam unimpressive.  Imaging overall unremarkable.  I suspect that the patient likely experienced strain/sprains in her neck, wrist, and hand.  We will provide her with a wrist brace for comfort.  Encouraged her to take Tylenol/ibuprofen as needed for pain.  We will plan to discharge.  Patient's presentation is most consistent with acute complicated illness / injury requiring diagnostic workup.   Provided the patient with anticipatory guidance, return precautions, and educational material. Encouraged the patient to return to the emergency department at any time if they begin to experience any new or worsening symptoms. Patient expressed understanding and agreed with the plan.       FINAL CLINICAL IMPRESSION(S) / ED DIAGNOSES   Final diagnoses:  Fall, initial encounter     Rx / DC Orders   ED Discharge Orders     None        Note:  This document was prepared using Dragon voice recognition software and may include unintentional dictation errors.   Teodoro Spray, [place] 11/15/21 2020    Naaman Plummer, MD 11/15/21 [phone]

## 2021-11-17 NOTE — Telephone Encounter (Signed)
Noted  

## 2021-11-21 NOTE — Progress Notes (Unsigned)
I,Tacoya Altizer S Elycia Woodside,acting as a scribe for Lavon Paganini, MD.,have documented all relevant documentation on the behalf of Lavon Paganini, MD,as directed by  Lavon Paganini, MD while in the presence of Lavon Paganini, MD.   Established patient visit   Patient: Emma Fletcher   DOB: 09-Jan-1956   66 y.o. Female  MRN: [id number] Visit Date: 11/22/2021  Today's healthcare provider: Lavon Paganini, MD   Chief Complaint  Patient presents with   Follow-up   Subjective    HPI  Follow up ER visit  Patient was seen in ER for fall on 11/15/21. She was treated for strain/sprains in neck, wrist and hand. Treatment for this included wrist brace. Tylenol/ibuprofen as needed for pain. She reports excellent compliance with treatment. She reports this condition is Unchanged.  -----------------------------------------------------------------------------------------  Patient C/O cyst on right side of groin. X2 wks.  Getting bigger. +painful. No drainage, + redness. Tried alcohol on it.  Avoiding friction from pads as this was making it worse.   Medications: Outpatient Medications Prior to Visit  Medication Sig   atorvastatin (LIPITOR) 20 MG tablet Take 1 tablet (20 mg total) by mouth daily.   Blood Glucose Monitoring Suppl (ONE TOUCH ULTRA 2) w/Device KIT Use as directed to check blood sugar once daily   Cholecalciferol (VITAMIN D3) 125 MCG (5000 UT) CAPS Take 1 capsule (5,000 Units total) by mouth daily.   Ferrous Sulfate (IRON) 325 (65 Fe) MG TABS Take 2 tablets by mouth daily.   furosemide (LASIX) 20 MG tablet Take 1 tablet (20 mg total) by mouth daily.   glucose blood (ONE TOUCH ULTRA TEST) test strip Use as instructed to check blood glucose daily   lansoprazole (PREVACID) 30 MG capsule Take 1 capsule (30 mg total) by mouth daily.   levothyroxine (SYNTHROID) 100 MCG tablet TAKE 1 TABLET EVERY DAY   lisinopril (ZESTRIL) 20 MG tablet Take 1 tablet (20 mg total) by mouth  daily.   metFORMIN (GLUCOPHAGE-XR) 500 MG 24 hr tablet Take 2 tablets (1,000 mg total) by mouth daily with breakfast.   Multiple Vitamins-Minerals (ONE-A-DAY WOMENS 50 PLUS PO) Take 1 tablet by mouth daily.   ONETOUCH DELICA LANCETS 20E MISC Use as directed to check blood glucose daily   polyethylene glycol powder (GLYCOLAX/MIRALAX) 17 GM/SCOOP powder Please use one cup of miralax daily.   potassium chloride (KLOR-CON) 10 MEQ tablet Take 1 tablet (10 mEq total) by mouth 2 (two) times daily.   Semaglutide, 1 MG/DOSE, 4 MG/3ML SOPN Inject 1 mg as directed once a week.   SPIRIVA HANDIHALER 18 MCG inhalation capsule INHALE THE CONTENTS OF 1 CAPSULE EVERY DAY AS DIRECTED   SYMBICORT 160-4.5 MCG/ACT inhaler INHALE 2 PUFFS TWICE DAILY   vitamin B-12 (CYANOCOBALAMIN) 1000 MCG tablet Take 1,000 mcg by mouth daily.   No facility-administered medications prior to visit.    Review of Systems  Constitutional:  Negative for activity change and fatigue.  Respiratory:  Negative for chest tightness and shortness of breath.   Cardiovascular:  Negative for chest pain and palpitations.  Musculoskeletal:  Positive for arthralgias and myalgias. Negative for neck pain and neck stiffness.  Skin:  Positive for rash.       Objective    BP (!) 151/74 (BP Location: Right Arm, Patient Position: Sitting, Cuff Size: Large)   Pulse 78   Temp 98.5 F (36.9 C) (Oral)   Resp 16   Wt 190 lb (86.2 kg)   LMP  (LMP Unknown)   BMI  34.75 kg/m  BP Readings from Last 3 Encounters:  11/22/21 (!) 151/74  11/15/21 (!) 158/92  10/17/21 130/70   Wt Readings from Last 3 Encounters:  11/22/21 190 lb (86.2 kg)  10/17/21 185 lb (83.9 kg)  07/11/21 184 lb (83.5 kg)      Physical Exam Vitals reviewed.  Constitutional:      General: She is not in acute distress.    Appearance: She is well-developed.  HENT:     Head: Normocephalic and atraumatic.  Eyes:     General: No scleral icterus.    Conjunctiva/sclera:  Conjunctivae normal.  Cardiovascular:     Rate and Rhythm: Normal rate and regular rhythm.  Pulmonary:     Effort: Pulmonary effort is normal. No respiratory distress.  Musculoskeletal:     Comments: R Wrist: Inspection normal with no visible erythema or swelling. ROM smooth and normal with good flexion and extension and ulnar/radial deviation that is symmetrical with opposite wrist. Palpation is normal over metacarpals, navicular, lunate, and TFCC; tendons without tenderness/ swelling. Mild soreness with all ROM Strength 5/5 in all directions without pain.  Skin:    General: Skin is warm and dry.     Comments: Indurated abscess of R pannus  Neurological:     Mental Status: She is alert and oriented to person, place, and time.  Psychiatric:        Behavior: Behavior normal.      No results found for any visits on 11/22/21.   Assessment & Plan   1. Abscess - new problem - indurated with no fluctuant area amenable to I&D today - Discussed using warm compresses - Bactrim x7 days for treatment - Return precautions discussed  2. Sprain of right wrist, subsequent encounter -Recent injury after recent fall - She was seen in the emergency department - I reviewed her x-rays with her - She is still having generalized soreness, but no point tenderness - She is right-handed - Advised her to wear her wrist brace when active for the next week and then she can come out of that - Advised rest and ice   Meds ordered this encounter  Medications   sulfamethoxazole-trimethoprim (BACTRIM DS) 800-160 MG tablet    Sig: Take 1 tablet by mouth 2 (two) times daily for 7 days.    Dispense:  14 tablet    Refill:  0      Return if symptoms worsen or fail to improve.      I, Lavon Paganini, MD, have reviewed all documentation for this visit. The documentation on 11/22/21 for the exam, diagnosis, procedures, and orders are all accurate and complete.   Bacigalupo, Dionne Bucy, MD,  [facility]

## 2021-11-22 ENCOUNTER — Encounter: Payer: Self-pay | Admitting: Family Medicine

## 2021-11-22 ENCOUNTER — Ambulatory Visit (INDEPENDENT_AMBULATORY_CARE_PROVIDER_SITE_OTHER): Payer: Medicare Other | Admitting: Family Medicine

## 2021-11-22 VITALS — BP 151/74 | HR 78 | Temp 98.5°F | Resp 16 | Wt 190.0 lb

## 2021-11-22 DIAGNOSIS — S63501D Unspecified sprain of right wrist, subsequent encounter: Secondary | ICD-10-CM | POA: Diagnosis not present

## 2021-11-22 DIAGNOSIS — L0291 Cutaneous abscess, unspecified: Secondary | ICD-10-CM | POA: Diagnosis not present

## 2021-11-22 MED ORDER — SULFAMETHOXAZOLE-TRIMETHOPRIM 800-160 MG PO TABS: 1.0000 | ORAL_TABLET | Freq: Two times a day (BID) | ORAL | 0 refills | Status: AC

## 2021-12-05 ENCOUNTER — Ambulatory Visit
Admission: RE | Admit: 2021-12-05 | Discharge: 2021-12-05 | Disposition: A | Discharge status: Home / Self Care | Payer: Medicare Other | Source: Ambulatory Visit | Attending: Family Medicine | Admitting: Family Medicine

## 2021-12-05 DIAGNOSIS — Z1231 Encounter for screening mammogram for malignant neoplasm of breast: Secondary | ICD-10-CM | POA: Diagnosis not present

## 2021-12-08 ENCOUNTER — Other Ambulatory Visit: Payer: Self-pay | Admitting: Family Medicine

## 2022-01-02 ENCOUNTER — Ambulatory Visit (INDEPENDENT_AMBULATORY_CARE_PROVIDER_SITE_OTHER): Payer: Medicare Other | Admitting: Podiatry

## 2022-01-02 ENCOUNTER — Encounter: Payer: Self-pay | Admitting: Podiatry

## 2022-01-02 DIAGNOSIS — B351 Tinea unguium: Secondary | ICD-10-CM

## 2022-01-02 DIAGNOSIS — N182 Chronic kidney disease, stage 2 (mild): Secondary | ICD-10-CM | POA: Diagnosis not present

## 2022-01-02 DIAGNOSIS — E119 Type 2 diabetes mellitus without complications: Secondary | ICD-10-CM | POA: Diagnosis not present

## 2022-01-02 DIAGNOSIS — M79676 Pain in unspecified toe(s): Secondary | ICD-10-CM | POA: Diagnosis not present

## 2022-01-02 NOTE — Progress Notes (Signed)
This patient returns to my office for at risk foot care.  This patient requires this care by a professional since this patient will be at risk due to having diabetes and chronic kidney disease.  This patient has painful area on the outside of her right  foot.  This is painful walking and wearing her shoes.. This patient presents for at risk foot care today.  Patient says she is experiencing pain at the base fifth metatarsal.  Swelling and pain noted right foot.  General Appearance  Alert, conversant and in no acute stress.  Vascular  Dorsalis pedis and posterior tibial  pulses are palpable  bilaterally.  Capillary return is within normal limits  bilaterally. Temperature is within normal limits  bilaterally.  Neurologic  Senn-Weinstein monofilament wire test within normal limits  bilaterally. Muscle power within normal limits bilaterally.  Nails Thick disfigured discolored nails with subungual debris  from hallux to fifth toes bilaterally. No evidence of bacterial infection or drainage bilaterally.  Orthopedic  No limitations of motion  feet .  No crepitus or effusions noted.  No bony pathology or digital deformities noted.    Skin  normotropic skin with noted bilaterally.  No signs of infections or ulcers noted.     Onychomycosis     Consent was obtained for treatment procedures.   Debridement of callus left foot with # 15 blade followed by dremel use.  Debride nails with nail nipper and dremel tool usage.     Return office visit  10 weeks                     Told patient to return for periodic foot care and evaluation due to potential at risk complications.   Helane Gunther DPM

## 2022-01-22 DIAGNOSIS — R079 Chest pain, unspecified: Secondary | ICD-10-CM | POA: Diagnosis not present

## 2022-01-22 DIAGNOSIS — R0602 Shortness of breath: Secondary | ICD-10-CM | POA: Diagnosis not present

## 2022-02-01 ENCOUNTER — Telehealth: Payer: Self-pay

## 2022-02-01 MED ORDER — METFORMIN HCL ER 500 MG PO TB24: 1000.0000 mg | ORAL_TABLET | Freq: Every day | ORAL | 1 refills | Status: DC

## 2022-02-01 NOTE — Addendum Note (Signed)
Addended by: Hyacinth Meeker on: 02/01/2022 04:47 PM   Modules accepted: Orders

## 2022-02-01 NOTE — Telephone Encounter (Signed)
Copied from CRM [id number]. Topic: General - Other >> Feb 01, 2022 10:45 AM Franchot Heidelberg wrote: Reason for CRM: Pt called requesting to speak to a nurse, did not disclose why. Hard to completely understand the patient's speech   Best contact: [phone]

## 2022-02-01 NOTE — Telephone Encounter (Signed)
Patient requesting refill for metformin

## 2022-02-03 DIAGNOSIS — M7671 Peroneal tendinitis, right leg: Secondary | ICD-10-CM | POA: Diagnosis not present

## 2022-02-03 DIAGNOSIS — M7672 Peroneal tendinitis, left leg: Secondary | ICD-10-CM | POA: Diagnosis not present

## 2022-02-07 ENCOUNTER — Ambulatory Visit: Payer: Self-pay

## 2022-02-07 NOTE — Telephone Encounter (Signed)
Pt called, spoke with her sister June. Advised pt is suppose to be taking 2 pills once daily. June didn't understand why pt called in asking that question when she knew that's what Dr. B told her. No further assistance needed.   Summary: Metformin ?   Patient called in asking how she is supposed to take Metformin. Is it twice a day?      Reason for Disposition  Caller has medicine question only, adult not sick, AND triager answers question  Answer Assessment - Initial Assessment Questions 1. NAME of MEDICINE: "What medicine(s) are you calling about?"     metformin 2. QUESTION: "What is your question?" (e.g., double dose of medicine, side effect)     Wanting to know how to take  3. PRESCRIBER: "Who prescribed the medicine?" Reason: if prescribed by specialist, call should be referred to that group.     Dr. Leonard Schwartz  Protocols used: Medication Question Call-A-AH

## 2022-03-08 ENCOUNTER — Other Ambulatory Visit: Payer: Self-pay | Admitting: Family Medicine

## 2022-04-10 ENCOUNTER — Ambulatory Visit (INDEPENDENT_AMBULATORY_CARE_PROVIDER_SITE_OTHER): Payer: Medicare HMO | Admitting: Podiatry

## 2022-04-10 ENCOUNTER — Encounter: Payer: Self-pay | Admitting: Podiatry

## 2022-04-10 DIAGNOSIS — M79676 Pain in unspecified toe(s): Secondary | ICD-10-CM | POA: Diagnosis not present

## 2022-04-10 DIAGNOSIS — E119 Type 2 diabetes mellitus without complications: Secondary | ICD-10-CM | POA: Diagnosis not present

## 2022-04-10 DIAGNOSIS — B351 Tinea unguium: Secondary | ICD-10-CM

## 2022-04-10 DIAGNOSIS — N182 Chronic kidney disease, stage 2 (mild): Secondary | ICD-10-CM

## 2022-04-10 DIAGNOSIS — Z0100 Encounter for examination of eyes and vision without abnormal findings: Secondary | ICD-10-CM | POA: Diagnosis not present

## 2022-04-10 DIAGNOSIS — H2513 Age-related nuclear cataract, bilateral: Secondary | ICD-10-CM | POA: Diagnosis not present

## 2022-04-10 LAB — HM DIABETES EYE EXAM

## 2022-04-10 NOTE — Progress Notes (Signed)
This patient returns to my office for at risk foot care.  This patient requires this care by a professional since this patient will be at risk due to having diabetes and chronic kidney disease.    This is painful walking and wearing her shoes.. This patient presents for at risk foot care today.  Patient says she is experiencing pain at the base fifth metatarsal.  Swelling and pain noted right foot.  General Appearance  Alert, conversant and in no acute stress.  Vascular  Dorsalis pedis and posterior tibial  pulses are palpable  bilaterally.  Capillary return is within normal limits  bilaterally. Temperature is within normal limits  bilaterally.  Neurologic  Senn-Weinstein monofilament wire test within normal limits  bilaterally. Muscle power within normal limits bilaterally.  Nails Thick disfigured discolored nails with subungual debris  from hallux to fifth toes bilaterally. No evidence of bacterial infection or drainage bilaterally.  Orthopedic  No limitations of motion  feet .  No crepitus or effusions noted.  No bony pathology or digital deformities noted.  Prominence at fifth metabase  B/l.  Skin  normotropic skin with noted bilaterally.  No signs of infections or ulcers noted.     Onychomycosis  with pain.   Consent was obtained for treatment procedures.     Debride nails with nail nipper and dremel tool usage.     Return office visit  10 weeks                     Told patient to return for periodic foot care and evaluation due to potential at risk complications.   Gardiner Barefoot DPM

## 2022-04-14 DIAGNOSIS — Q828 Other specified congenital malformations of skin: Secondary | ICD-10-CM | POA: Diagnosis not present

## 2022-04-14 DIAGNOSIS — M7672 Peroneal tendinitis, left leg: Secondary | ICD-10-CM | POA: Diagnosis not present

## 2022-04-21 NOTE — Progress Notes (Unsigned)
I,Randy Whitener S Salvador Bigbee,acting as a scribe for Lavon Paganini, MD.,have documented all relevant documentation on the behalf of Lavon Paganini, MD,as directed by  Lavon Paganini, MD while in the presence of Lavon Paganini, MD.     Established patient visit   Patient: Emma Fletcher   DOB: [birth year]   66 y.o. Female  MRN: [id number] Visit Date: 04/24/2022  Today's healthcare provider: Lavon Paganini, MD   Chief Complaint  Patient presents with   Diabetes   Hypertension   Hyperlipidemia   Subjective    HPI  Diabetes Mellitus Type II, follow-up  Lab Results  Component Value Date   HGBA1C 7.0 (A) 04/24/2022   HGBA1C 7.1 (H) 10/17/2021   HGBA1C 6.9 (A) 07/11/2021   Last seen for diabetes 6 months ago.  Management since then includes advised to increase metformin to 1000 mg in the morning and an additional 500 mg dose in the evening. Or work on diet instead. Patient requested to work on diet without changing medication. She reports excellent compliance with treatment. She is not having side effects.   Home blood sugar records: fasting range: 120-130s  Episodes of hypoglycemia? No    Current insulin regiment: none Most Recent Eye Exam: UTD  --------------------------------------------------------------------------------------------------- Hypertension, follow-up  BP Readings from Last 3 Encounters:  04/24/22 129/75  11/22/21 (!) 151/74  11/15/21 (!) 158/92   Wt Readings from Last 3 Encounters:  04/24/22 196 lb 6.4 oz (89.1 kg)  11/22/21 190 lb (86.2 kg)  10/17/21 185 lb (83.9 kg)     She was last seen for hypertension 6 months ago.  BP at that visit was 158/92. Management since that visit includes no changes. She reports excellent compliance with treatment. She is not having side effects.  She is exercising. She is not adherent to low salt diet.   Outside blood pressures are stable.  She does not smoke.  Use of agents associated with  hypertension: none.   --------------------------------------------------------------------------------------------------- Lipid/Cholesterol, follow-up  Last Lipid Panel: Lab Results  Component Value Date   CHOL 145 04/05/2021   LDLCALC 66 04/05/2021   HDL 63 04/05/2021   TRIG 86 04/05/2021    She was last seen for this 6 months ago.  Management since that visit includes no changes.  She reports excellent compliance with treatment. She is not having side effects.   Last metabolic panel Lab Results  Component Value Date   GLUCOSE 184 (H) 07/11/2021   NA 135 07/11/2021   K 4.6 07/11/2021   BUN 12 07/11/2021   CREATININE 0.74 07/11/2021   EGFR 90 07/11/2021   GFRNONAA 89 04/12/2020   CALCIUM 10.0 07/11/2021   AST 16 07/11/2021   ALT 16 07/11/2021   The 10-year ASCVD risk score (Arnett DK, et al., 2019) is: 11.4%  --------------------------------------------------------------------------------------------------- R lateral hip pain - recurrent - cortisone shot for bursitis 03/2020. X2 m, hurts to lay on that side.  Medications: Outpatient Medications Prior to Visit  Medication Sig   atorvastatin (LIPITOR) 20 MG tablet TAKE 1 TABLET EVERY DAY   Blood Glucose Monitoring Suppl (ONE TOUCH ULTRA 2) w/Device KIT Use as directed to check blood sugar once daily   Cholecalciferol (VITAMIN D3) 125 MCG (5000 UT) CAPS Take 1 capsule (5,000 Units total) by mouth daily.   Ferrous Sulfate (IRON) 325 (65 Fe) MG TABS Take 2 tablets by mouth daily.   furosemide (LASIX) 20 MG tablet TAKE 1 TABLET EVERY DAY   glucose blood (ONE TOUCH ULTRA TEST) test strip Use  as instructed to check blood glucose daily   lansoprazole (PREVACID) 30 MG capsule TAKE 1 CAPSULE EVERY DAY   levothyroxine (SYNTHROID) 100 MCG tablet TAKE 1 TABLET EVERY DAY   lisinopril (ZESTRIL) 20 MG tablet TAKE 1 TABLET EVERY DAY   metFORMIN (GLUCOPHAGE-XR) 500 MG 24 hr tablet Take 2 tablets (1,000 mg total) by mouth daily with  breakfast.   Multiple Vitamins-Minerals (ONE-A-DAY WOMENS 50 PLUS PO) Take 1 tablet by mouth daily.   ONETOUCH DELICA LANCETS 88B MISC Use as directed to check blood glucose daily   OZEMPIC, 1 MG/DOSE, 4 MG/3ML SOPN INJECT 1MG SUBCUTANEOUSLY ONE TIME WEEKLY AS DIRECTED   polyethylene glycol powder (GLYCOLAX/MIRALAX) 17 GM/SCOOP powder Please use one cup of miralax daily.   potassium chloride (KLOR-CON M) 10 MEQ tablet TAKE 1 TABLET TWICE DAILY   SPIRIVA HANDIHALER 18 MCG inhalation capsule INHALE THE CONTENTS OF 1 CAPSULE EVERY DAY AS DIRECTED   SYMBICORT 160-4.5 MCG/ACT inhaler INHALE 2 PUFFS TWICE DAILY   vitamin B-12 (CYANOCOBALAMIN) 1000 MCG tablet Take 1,000 mcg by mouth daily.   No facility-administered medications prior to visit.    Review of Systems  Constitutional:  Negative for appetite change and fatigue.  Eyes:  Negative for visual disturbance.  Respiratory:  Negative for chest tightness and shortness of breath.   Cardiovascular:  Negative for chest pain, palpitations and leg swelling.  Gastrointestinal:  Negative for abdominal pain, diarrhea, nausea and vomiting.       Objective    BP 129/75 Comment: home reading  Pulse 75   Temp 98.5 F (36.9 C) (Oral)   Resp 16   Wt 196 lb 6.4 oz (89.1 kg)   LMP  (LMP Unknown)   BMI 35.92 kg/m  BP Readings from Last 3 Encounters:  04/24/22 129/75  11/22/21 (!) 151/74  11/15/21 (!) 158/92   Wt Readings from Last 3 Encounters:  04/24/22 196 lb 6.4 oz (89.1 kg)  11/22/21 190 lb (86.2 kg)  10/17/21 185 lb (83.9 kg)      Physical Exam Vitals reviewed.  Constitutional:      General: She is not in acute distress.    Appearance: Normal appearance. She is well-developed. She is not diaphoretic.  HENT:     Head: Normocephalic and atraumatic.  Eyes:     General: No scleral icterus.    Conjunctiva/sclera: Conjunctivae normal.  Neck:     Thyroid: No thyromegaly.  Cardiovascular:     Rate and Rhythm: Normal rate and regular  rhythm.     Heart sounds: Murmur heard.  Pulmonary:     Effort: Pulmonary effort is normal. No respiratory distress.     Breath sounds: Normal breath sounds. No wheezing, rhonchi or rales.  Musculoskeletal:     Cervical back: Neck supple.     Right lower leg: No edema.     Left lower leg: No edema.  Lymphadenopathy:     Cervical: No cervical adenopathy.  Skin:    General: Skin is warm and dry.     Findings: No rash.  Neurological:     Mental Status: She is alert and oriented to person, place, and time. Mental status is at baseline.  Psychiatric:        Mood and Affect: Mood normal.        Behavior: Behavior normal.       Results for orders placed or performed in visit on 04/24/22  POCT glycosylated hemoglobin (Hb A1C)  Result Value Ref Range   Hemoglobin A1C 7.0 (  A) 4.0 - 5.6 %   Est. average glucose Bld gHb Est-mCnc 154     Assessment & Plan     Problem List Items Addressed This Visit       Cardiovascular and Mediastinum   Hypertension associated with diabetes ([place])    Well controlled on home readigns Continue current medications Recheck metabolic panel F/u in 6 months       Relevant Orders   Comprehensive metabolic panel     Endocrine   T2DM (type 2 diabetes mellitus) ([place]) - Primary    Chronic and well-controlled Associated with/complicated by HTN and HLD Vomiting has improved since decreasing Ozempic dose Up-to-date on screenings Up-to-date on vaccinations No changes to medications      Relevant Orders   POCT glycosylated hemoglobin (Hb A1C) (Completed)   Hyperlipidemia associated with type 2 diabetes mellitus (HCC)    Previously well controlled Continue statin Repeat FLP and CMP Goal LDL <70      Relevant Orders   Comprehensive metabolic panel   Lipid Panel With LDL/HDL Ratio   Hypothyroidism    Previously well controlled Continue Synthroid at current dose  Recheck TSH and adjust Synthroid as indicated        Relevant Orders   TSH      Musculoskeletal and Integument   Trochanteric bursitis of right hip    New problem Had this previously 2 years ago Symptoms c/w bursitis Repeat cortisone shot (worked well 2 years ago)  INJECTION: Patient was given informed consent,. Appropriate time out was taken. Area prepped and draped in usual sterile fashion.  1 cc of depo-medrol 40 mg/ml plus 2 cc of 1% lidocaine without epinephrine was injected into the right greater trochanteric bursa using a(n) lateral approach. The patient tolerated the procedure well. There were no complications. Post procedure instructions were given.         Genitourinary   CKD (chronic kidney disease), stage II    Chronic and stable Recheck metabolic panel Avoid nephrotoxic meds         Other   Anemia of chronic disease    Continue to monitor CBC and B12 every 6 months      Relevant Orders   B12   CBC   Obesity    Discussed importance of healthy weight management Discussed diet and exercise       Other Visit Diagnoses     Need for influenza vaccination       Relevant Orders   Flu Vaccine QUAD High Dose(Fluad) (Completed)   Need for pneumococcal 20-valent conjugate vaccination       Relevant Orders   Pneumococcal conjugate vaccine 20-valent (Completed)        Return in about 6 months (around 10/23/2022) for chronic disease f/u.      I, Lavon Paganini, MD, have reviewed all documentation for this visit. The documentation on 04/24/22 for the exam, diagnosis, procedures, and orders are all accurate and complete.   Bacigalupo, Dionne Bucy, MD, MPH [facility]

## 2022-04-21 NOTE — Patient Instructions (Signed)
   The CDC recommends two doses of Shingrix (the shingles vaccine) separated by 2 to 6 months for adults age 65 years and older. I recommend checking with your insurance plan regarding coverage for this vaccine.   

## 2022-04-24 ENCOUNTER — Encounter: Payer: Self-pay | Admitting: Family Medicine

## 2022-04-24 ENCOUNTER — Ambulatory Visit (INDEPENDENT_AMBULATORY_CARE_PROVIDER_SITE_OTHER): Payer: Medicare HMO | Admitting: Family Medicine

## 2022-04-24 VITALS — BP 129/75 | HR 75 | Temp 98.5°F | Resp 16 | Wt 196.4 lb

## 2022-04-24 DIAGNOSIS — E785 Hyperlipidemia, unspecified: Secondary | ICD-10-CM

## 2022-04-24 DIAGNOSIS — M7061 Trochanteric bursitis, right hip: Secondary | ICD-10-CM

## 2022-04-24 DIAGNOSIS — Z6833 Body mass index (BMI) 33.0-33.9, adult: Secondary | ICD-10-CM

## 2022-04-24 DIAGNOSIS — N182 Chronic kidney disease, stage 2 (mild): Secondary | ICD-10-CM

## 2022-04-24 DIAGNOSIS — I152 Hypertension secondary to endocrine disorders: Secondary | ICD-10-CM

## 2022-04-24 DIAGNOSIS — D638 Anemia in other chronic diseases classified elsewhere: Secondary | ICD-10-CM

## 2022-04-24 DIAGNOSIS — Z23 Encounter for immunization: Secondary | ICD-10-CM

## 2022-04-24 DIAGNOSIS — E1169 Type 2 diabetes mellitus with other specified complication: Secondary | ICD-10-CM

## 2022-04-24 DIAGNOSIS — E1159 Type 2 diabetes mellitus with other circulatory complications: Secondary | ICD-10-CM | POA: Diagnosis not present

## 2022-04-24 DIAGNOSIS — E669 Obesity, unspecified: Secondary | ICD-10-CM | POA: Diagnosis not present

## 2022-04-24 DIAGNOSIS — E039 Hypothyroidism, unspecified: Secondary | ICD-10-CM

## 2022-04-24 LAB — POCT GLYCOSYLATED HEMOGLOBIN (HGB A1C)
Est. average glucose Bld gHb Est-mCnc: 154
Hemoglobin A1C: 7 % — AB (ref 4.0–5.6)

## 2022-04-24 NOTE — Assessment & Plan Note (Signed)
Continue to monitor CBC and B12 every 6 months

## 2022-04-24 NOTE — Assessment & Plan Note (Signed)
Previously well controlled Continue statin Repeat FLP and CMP Goal LDL < 70 

## 2022-04-24 NOTE — Assessment & Plan Note (Signed)
Discussed importance of healthy weight management Discussed diet and exercise  

## 2022-04-24 NOTE — Assessment & Plan Note (Signed)
Chronic and well-controlled Associated with/complicated by HTN and HLD Vomiting has improved since decreasing Ozempic dose Up-to-date on screenings Up-to-date on vaccinations No changes to medications

## 2022-04-24 NOTE — Assessment & Plan Note (Signed)
New problem Had this previously 2 years ago Symptoms c/w bursitis Repeat cortisone shot (worked well 2 years ago)  INJECTION: Patient was given informed consent,. Appropriate time out was taken. Area prepped and draped in usual sterile fashion.  1 cc of depo-medrol 40 mg/ml plus 2 cc of 1% lidocaine without epinephrine was injected into the right greater trochanteric bursa using a(n) lateral approach. The patient tolerated the procedure well. There were no complications. Post procedure instructions were given.

## 2022-04-24 NOTE — Assessment & Plan Note (Signed)
Chronic and stable Recheck metabolic panel Avoid nephrotoxic meds  

## 2022-04-24 NOTE — Assessment & Plan Note (Signed)
Well controlled on home readigns Continue current medications Recheck metabolic panel F/u in 6 months  

## 2022-04-24 NOTE — Assessment & Plan Note (Signed)
Previously well controlled Continue Synthroid at current dose  Recheck TSH and adjust Synthroid as indicated   

## 2022-04-25 ENCOUNTER — Other Ambulatory Visit: Payer: Self-pay

## 2022-04-25 DIAGNOSIS — E875 Hyperkalemia: Secondary | ICD-10-CM

## 2022-04-25 LAB — COMPREHENSIVE METABOLIC PANEL
ALT: 15 IU/L (ref 0–32)
AST: 13 IU/L (ref 0–40)
Albumin/Globulin Ratio: 1.8 (ref 1.2–2.2)
Albumin: 4.4 g/dL (ref 3.9–4.9)
Alkaline Phosphatase: 137 IU/L — ABNORMAL HIGH (ref 44–121)
BUN/Creatinine Ratio: 24 (ref 12–28)
BUN: 20 mg/dL (ref 8–27)
Bilirubin Total: 0.3 mg/dL (ref 0.0–1.2)
CO2: 22 mmol/L (ref 20–29)
Calcium: 10.5 mg/dL — ABNORMAL HIGH (ref 8.7–10.3)
Chloride: 101 mmol/L (ref 96–106)
Creatinine, Ser: 0.83 mg/dL (ref 0.57–1.00)
Globulin, Total: 2.4 g/dL (ref 1.5–4.5)
Glucose: 151 mg/dL — ABNORMAL HIGH (ref 70–99)
Potassium: 5.6 mmol/L — ABNORMAL HIGH (ref 3.5–5.2)
Sodium: 136 mmol/L (ref 134–144)
Total Protein: 6.8 g/dL (ref 6.0–8.5)
eGFR: 78 mL/min/{1.73_m2} (ref >59)

## 2022-04-25 LAB — CBC
Hematocrit: 32.9 % — ABNORMAL LOW (ref 34.0–46.6)
Hemoglobin: 11.1 g/dL (ref 11.1–15.9)
MCH: 30.6 pg (ref 26.6–33.0)
MCHC: 33.7 g/dL (ref 31.5–35.7)
MCV: 91 fL (ref 79–97)
Platelets: 317 10*3/uL (ref 150–450)
RBC: 3.63 x10E6/uL — ABNORMAL LOW (ref 3.77–5.28)
RDW: 11.5 % — ABNORMAL LOW (ref 11.7–15.4)
WBC: 10.1 10*3/uL (ref 3.4–10.8)

## 2022-04-25 LAB — LIPID PANEL WITH LDL/HDL RATIO
Cholesterol, Total: 164 mg/dL (ref 100–199)
HDL: 59 mg/dL (ref >39)
LDL Chol Calc (NIH): 90 mg/dL (ref 0–99)
LDL/HDL Ratio: 1.5 ratio (ref 0.0–3.2)
Triglycerides: 81 mg/dL (ref 0–149)
VLDL Cholesterol Cal: 15 mg/dL (ref 5–40)

## 2022-04-25 LAB — TSH: TSH: 0.976 u[IU]/mL (ref 0.450–4.500)

## 2022-04-25 LAB — VITAMIN B12: Vitamin B-12: 790 pg/mL (ref 232–1245)

## 2022-05-08 DIAGNOSIS — E875 Hyperkalemia: Secondary | ICD-10-CM | POA: Diagnosis not present

## 2022-05-09 LAB — COMPREHENSIVE METABOLIC PANEL
ALT: 15 IU/L (ref 0–32)
AST: 16 IU/L (ref 0–40)
Albumin/Globulin Ratio: 1.8 (ref 1.2–2.2)
Albumin: 4.2 g/dL (ref 3.9–4.9)
Alkaline Phosphatase: 130 IU/L — ABNORMAL HIGH (ref 44–121)
BUN/Creatinine Ratio: 23 (ref 12–28)
BUN: 17 mg/dL (ref 8–27)
Bilirubin Total: 0.3 mg/dL (ref 0.0–1.2)
CO2: 23 mmol/L (ref 20–29)
Calcium: 10.1 mg/dL (ref 8.7–10.3)
Chloride: 101 mmol/L (ref 96–106)
Creatinine, Ser: 0.74 mg/dL (ref 0.57–1.00)
Globulin, Total: 2.3 g/dL (ref 1.5–4.5)
Glucose: 146 mg/dL — ABNORMAL HIGH (ref 70–99)
Potassium: 5.4 mmol/L — ABNORMAL HIGH (ref 3.5–5.2)
Sodium: 136 mmol/L (ref 134–144)
Total Protein: 6.5 g/dL (ref 6.0–8.5)
eGFR: 90 mL/min/{1.73_m2} (ref >59)

## 2022-05-09 NOTE — Progress Notes (Signed)
Labs relatively unchanged; recommend stopping doses of potassium supplementation. Can repeat CMP in 1 month. Also can establish with GI for further investigation of alk phos elevations if pt decides.  Elise T Payne, FNP  [facility] [address] [place], [place] [address] [phone] (phone) [phone] (fax) [facility] 

## 2022-05-27 ENCOUNTER — Other Ambulatory Visit: Payer: Self-pay | Admitting: Family Medicine

## 2022-06-07 ENCOUNTER — Ambulatory Visit (INDEPENDENT_AMBULATORY_CARE_PROVIDER_SITE_OTHER): Payer: Commercial Managed Care - PPO

## 2022-06-07 VITALS — Wt 196.0 lb

## 2022-06-07 DIAGNOSIS — Z0000 Encounter for general adult medical examination without abnormal findings: Secondary | ICD-10-CM | POA: Diagnosis not present

## 2022-06-07 NOTE — Patient Instructions (Signed)
Emma Fletcher , Thank you for taking time to come for your Medicare Wellness Visit. I appreciate your ongoing commitment to your health goals. Please review the following plan we discussed and let me know if I can assist you in the future.   Screening recommendations/referrals: Colonoscopy: 04/24/18 Mammogram: 12/05/21 Bone Density: declined referral Recommended yearly ophthalmology/optometry visit for glaucoma screening and checkup Recommended yearly dental visit for hygiene and checkup  Vaccinations: Influenza vaccine: 04/24/22 Pneumococcal vaccine: 03/26/18 Tdap vaccine: 03/30/19 Shingles vaccine: n/d   Covid-19:09/17/19, 10/15/19  Advanced directives: yes  Conditions/risks identified: none  Next appointment: Follow up in one year for your annual wellness visit 06/11/23 @ 10:45 am by phone   Preventive Care 65 Years and Older, Female Preventive care refers to lifestyle choices and visits with your health care provider that can promote health and wellness. What does preventive care include? A yearly physical exam. This is also called an annual well check. Dental exams once or twice a year. Routine eye exams. Ask your health care provider how often you should have your eyes checked. Personal lifestyle choices, including: Daily care of your teeth and gums. Regular physical activity. Eating a healthy diet. Avoiding tobacco and drug use. Limiting alcohol use. Practicing safe sex. Taking low-dose aspirin every day. Taking vitamin and mineral supplements as recommended by your health care provider. What happens during an annual well check? The services and screenings done by your health care provider during your annual well check will depend on your age, overall health, lifestyle risk factors, and family history of disease. Counseling  Your health care provider may ask you questions about your: Alcohol use. Tobacco use. Drug use. Emotional well-being. Home and relationship  well-being. Sexual activity. Eating habits. History of falls. Memory and ability to understand (cognition). Work and work Astronomer. Reproductive health. Screening  You may have the following tests or measurements: Height, weight, and BMI. Blood pressure. Lipid and cholesterol levels. These may be checked every 5 years, or more frequently if you are over 73 years old. Skin check. Lung cancer screening. You may have this screening every year starting at age 64 if you have a 30-pack-year history of smoking and currently smoke or have quit within the past 15 years. Fecal occult blood test (FOBT) of the stool. You may have this test every year starting at age 40. Flexible sigmoidoscopy or colonoscopy. You may have a sigmoidoscopy every 5 years or a colonoscopy every 10 years starting at age 50. Hepatitis C blood test. Hepatitis B blood test. Sexually transmitted disease (STD) testing. Diabetes screening. This is done by checking your blood sugar (glucose) after you have not eaten for a while (fasting). You may have this done every 1-3 years. Bone density scan. This is done to screen for osteoporosis. You may have this done starting at age 44. Mammogram. This may be done every 1-2 years. Talk to your health care provider about how often you should have regular mammograms. Talk with your health care provider about your test results, treatment options, and if necessary, the need for more tests. Vaccines  Your health care provider may recommend certain vaccines, such as: Influenza vaccine. This is recommended every year. Tetanus, diphtheria, and acellular pertussis (Tdap, Td) vaccine. You may need a Td booster every 10 years. Zoster vaccine. You may need this after age 106. Pneumococcal 13-valent conjugate (PCV13) vaccine. One dose is recommended after age 25. Pneumococcal polysaccharide (PPSV23) vaccine. One dose is recommended after age 37. Talk to your health care  provider about which  screenings and vaccines you need and how often you need them. This information is not intended to replace advice given to you by your health care provider. Make sure you discuss any questions you have with your health care provider. Document Released: 07/02/2015 Document Revised: 02/23/2016 Document Reviewed: 04/06/2015 Elsevier Interactive Patient Education  2017 [place] Prevention in the Home Falls can cause injuries. They can happen to people of all ages. There are many things you can do to make your home safe and to help prevent falls. What can I do on the outside of my home? Regularly fix the edges of walkways and driveways and fix any cracks. Remove anything that might make you trip as you walk through a door, such as a raised step or threshold. Trim any bushes or trees on the path to your home. Use bright outdoor lighting. Clear any walking paths of anything that might make someone trip, such as rocks or tools. Regularly check to see if handrails are loose or broken. Make sure that both sides of any steps have handrails. Any raised decks and porches should have guardrails on the edges. Have any leaves, snow, or ice cleared regularly. Use sand or salt on walking paths during winter. Clean up any spills in your garage right away. This includes oil or grease spills. What can I do in the bathroom? Use night lights. Install grab bars by the toilet and in the tub and shower. Do not use towel bars as grab bars. Use non-skid mats or decals in the tub or shower. If you need to sit down in the shower, use a plastic, non-slip stool. Keep the floor dry. Clean up any water that spills on the floor as soon as it happens. Remove soap buildup in the tub or shower regularly. Attach bath mats securely with double-sided non-slip rug tape. Do not have throw rugs and other things on the floor that can make you trip. What can I do in the bedroom? Use night lights. Make sure that you have a  light by your bed that is easy to reach. Do not use any sheets or blankets that are too big for your bed. They should not hang down onto the floor. Have a firm chair that has side arms. You can use this for support while you get dressed. Do not have throw rugs and other things on the floor that can make you trip. What can I do in the kitchen? Clean up any spills right away. Avoid walking on wet floors. Keep items that you use a lot in easy-to-reach places. If you need to reach something above you, use a strong step stool that has a grab bar. Keep electrical cords out of the way. Do not use floor polish or wax that makes floors slippery. If you must use wax, use non-skid floor wax. Do not have throw rugs and other things on the floor that can make you trip. What can I do with my stairs? Do not leave any items on the stairs. Make sure that there are handrails on both sides of the stairs and use them. Fix handrails that are broken or loose. Make sure that handrails are as long as the stairways. Check any carpeting to make sure that it is firmly attached to the stairs. Fix any carpet that is loose or worn. Avoid having throw rugs at the top or bottom of the stairs. If you do have throw rugs, attach them to the  floor with carpet tape. Make sure that you have a light switch at the top of the stairs and the bottom of the stairs. If you do not have them, ask someone to add them for you. What else can I do to help prevent falls? Wear shoes that: Do not have high heels. Have rubber bottoms. Are comfortable and fit you well. Are closed at the toe. Do not wear sandals. If you use a stepladder: Make sure that it is fully opened. Do not climb a closed stepladder. Make sure that both sides of the stepladder are locked into place. Ask someone to hold it for you, if possible. Clearly mark and make sure that you can see: Any grab bars or handrails. First and last steps. Where the edge of each step  is. Use tools that help you move around (mobility aids) if they are needed. These include: Canes. Walkers. Scooters. Crutches. Turn on the lights when you go into a dark area. Replace any light bulbs as soon as they burn out. Set up your furniture so you have a clear path. Avoid moving your furniture around. If any of your floors are uneven, fix them. If there are any pets around you, be aware of where they are. Review your medicines with your doctor. Some medicines can make you feel dizzy. This can increase your chance of falling. Ask your doctor what other things that you can do to help prevent falls. This information is not intended to replace advice given to you by your health care provider. Make sure you discuss any questions you have with your health care provider. Document Released: 04/01/2009 Document Revised: 11/11/2015 Document Reviewed: 07/10/2014 Elsevier Interactive Patient Education  2017 [employer].

## 2022-06-07 NOTE — Progress Notes (Signed)
Virtual Visit via Telephone Note  I connected with  Emma Fletcher on 06/07/22 at 10:15 AM EST by telephone and verified that I am speaking with the correct person using two identifiers.  Location: Patient: home Provider: [facility] Persons participating in the virtual visit: [place]   I discussed the limitations, risks, security and privacy concerns of performing an evaluation and management service by telephone and the availability of in person appointments. The patient expressed understanding and agreed to proceed.  Interactive audio and video telecommunications were attempted between this nurse and patient, however failed, due to patient having technical difficulties OR patient did not have access to video capability.  We continued and completed visit with audio only.  Some vital signs may be absent or patient reported.   Dionisio David, LPN  Subjective:   Emma Fletcher is a 66 y.o. female who presents for Medicare Annual (Subsequent) preventive examination.  Review of Systems     Cardiac Risk Factors include: advanced age (>41mn, >>86women);diabetes mellitus     Objective:    There were no vitals filed for this visit. There is no height or weight on file to calculate BMI.     06/07/2022   10:22 AM 11/15/2021    5:19 PM 06/06/2021    9:44 AM 05/31/2020    9:59 AM 03/31/2019   10:05 AM 07/02/2018   11:03 AM 04/24/2018   12:37 PM  Advanced Directives  Does Patient Have a Medical Advance Directive? Yes No _0   Type of AParamedicof A[place]ass[place]Living will  [place]Living will HMount MorrisLiving will HH[place]Living will  [place] will  Does patient want to make changes to medical advance directive? No - Patient declined  Yes (Inpatient - patient defers changing a medical advance directive and declines information at this time)   No -  Patient declined   Copy of HHayti Heightsin Chart? Yes - validated most recent copy scanned in chart (See row information)  Yes - validated most recent copy scanned in chart (See row information) Yes - validated most recent copy scanned in chart (See row information) Yes - validated most recent copy scanned in chart (See row information)      Current Medications (verified) Outpatient Encounter Medications as of 06/07/2022  Medication Sig   atorvastatin (LIPITOR) 20 MG tablet TAKE 1 TABLET EVERY DAY   Blood Glucose Monitoring Suppl (ONE TOUCH ULTRA 2) w/Device KIT Use as directed to check blood sugar once daily   Cholecalciferol (VITAMIN D3) 125 MCG (5000 UT) CAPS TAKE 1 CAPSULE EVERY DAY   Ferrous Sulfate (IRON) 325 (65 Fe) MG TABS Take 2 tablets by mouth daily.   furosemide (LASIX) 20 MG tablet TAKE 1 TABLET EVERY DAY   glucose blood (ONE TOUCH ULTRA TEST) test strip Use as instructed to check blood glucose daily   lansoprazole (PREVACID) 30 MG capsule TAKE 1 CAPSULE EVERY DAY   levothyroxine (SYNTHROID) 100 MCG tablet TAKE 1 TABLET EVERY DAY   lisinopril (ZESTRIL) 20 MG tablet TAKE 1 TABLET EVERY DAY   metFORMIN (GLUCOPHAGE-XR) 500 MG 24 hr tablet Take 2 tablets (1,000 mg total) by mouth daily with breakfast.   Multiple Vitamins-Minerals (ONE-A-DAY WOMENS 50 PLUS PO) Take 1 tablet by mouth daily.   ONETOUCH DELICA LANCETS 311XMISC Use as directed to check blood glucose daily   OZEMPIC, 1 MG/DOSE, 4 MG/3ML SOPN INJECT  1MG SUBCUTANEOUSLY ONE TIME WEEKLY AS DIRECTED   polyethylene glycol powder (GLYCOLAX/MIRALAX) 17 GM/SCOOP powder Please use one cup of miralax daily.   SPIRIVA HANDIHALER 18 MCG inhalation capsule INHALE THE CONTENTS OF 1 CAPSULE EVERY DAY AS DIRECTED   SYMBICORT 160-4.5 MCG/ACT inhaler INHALE 2 PUFFS TWICE DAILY   vitamin B-12 (CYANOCOBALAMIN) 1000 MCG tablet Take 1,000 mcg by mouth daily.   potassium chloride (KLOR-CON M) 10 MEQ tablet TAKE 1 TABLET TWICE  DAILY (Patient not taking: Reported on 06/07/2022)   No facility-administered encounter medications on file as of 06/07/2022.    Allergies (verified) Patient has no known allergies.   History: Past Medical History:  Diagnosis Date   Anemia    Bronchitis    Cardiomyopathy secondary    Developmental delay    Diabetes mellitus without complication ([place])    Edema    Hypercholesterolemia    Unspecified essential hypertension    Past Surgical History:  Procedure Laterality Date   COLONOSCOPY     COLONOSCOPY WITH PROPOFOL N/A 04/24/2018   Procedure: COLONOSCOPY WITH PROPOFOL;  Surgeon: Jonathon Bellows, MD;  [facility];  Service: Gastroenterology;  Laterality: N/A;   none     Family History  Problem Relation Age of Onset   Diabetes Mother    Hypertension Mother    Stroke Mother    Diabetes Father    Coronary artery disease Father    Breast cancer Maternal Aunt    Lung cancer Maternal Uncle    Prostate cancer Paternal Uncle    Uterine cancer Other    Social History   Socioeconomic History   Marital status: Single    Spouse name: Not on file   Number of children: 0   Years of education: Not on file   Highest education level: High school graduate  Occupational History   Occupation: Part time works in [employer] Use   Smoking status: Never   Smokeless tobacco: Never  Vaping Use   Vaping Use: Never used  Substance and Sexual Activity   Alcohol use: No    Alcohol/week: 0.0 standard drinks of alcohol   Drug use: No   Sexual activity: Not on file  Other Topics Concern   Not on file  Social History Narrative   Not on file   Social Determinants of Health   Financial Resource Strain: Low Risk  (06/07/2022)   Overall Financial Resource Strain (CARDIA)    Difficulty of Paying Living Expenses: Not hard at all  Food Insecurity: No Food Insecurity (06/07/2022)   Hunger Vital Sign    Worried About Running Out of Food in the Last Year: Never true    [place] in the Last Year: Never true  Transportation Needs: No Transportation Needs (06/07/2022)   PRAPARE - Hydrologist (Medical): No    Lack of Transportation (Non-Medical): No  Physical Activity: Insufficiently Active (06/07/2022)   Exercise Vital Sign    Days of Exercise per Week: 6 days    Minutes of Exercise per Session: 20 min  Stress: No Stress Concern Present (06/07/2022)   [place]    Feeling of Stress : Not at all  Social Connections: Moderately Isolated (06/07/2022)   Social Connection and Isolation Panel [NHANES]    Frequency of Communication with Friends and Family: Twice a week    Frequency of Social Gatherings with Friends and Family: Once a week    Attends  Religious Services: More than 4 times per year    Active Member of Clubs or Organizations: No    Attends Archivist Meetings: Never    Marital Status: Never married    Tobacco Counseling Counseling given: Not Answered   Clinical Intake:  Pre-visit preparation completed: Yes  Pain : No/denies pain     Nutritional Risks: None Diabetes: Yes CBG done?: No Did pt. bring in CBG monitor from home?: No  How often do you need to have someone help you when you read instructions, pamphlets, or other written materials from your doctor or pharmacy?: 1 - Never  Diabetic?yes Nutrition Risk Assessment:  Has the patient had any N/V/D within the last 2 months?  No  Does the patient have any non-healing wounds?  No  Has the patient had any unintentional weight loss or weight gain?  No   Diabetes:  Is the patient diabetic?  Yes  If diabetic, was a CBG obtained today?  No  Did the patient bring in their glucometer from home?  No  How often do you monitor your CBG's? Once/day.   Financial Strains and Diabetes Management:  Are you having any financial strains with the device, your supplies or your  medication? No .  Does the patient want to be seen by Chronic Care Management for management of their diabetes?  No  Would the patient like to be referred to a Nutritionist or for Diabetic Management?  No   Diabetic Exams:  Diabetic Eye Exam: Completed 04/10/22. Marland Kitchen Pt has been advised about the importance in completing this exam.  Diabetic Foot Exam: Completed 07/11/21. Pt has been advised about the importance in completing this exam..    Interpreter Needed?: No  Information entered by :: Kirke Shaggy, LPN   Activities of Daily Living    06/07/2022   10:23 AM 04/24/2022    8:26 AM  In your present state of health, do you have any difficulty performing the following activities:  Hearing? 1 1  Vision? 1 1  Difficulty concentrating or making decisions? 0 0  Walking or climbing stairs? 0 0  Dressing or bathing? 0 0  Doing errands, shopping? 1 1  Preparing Food and eating ? N   Using the Toilet? N   In the past six months, have you accidently leaked urine? N   Do you have problems with loss of bowel control? N   Managing your Medications? N   Managing your Finances? Y   Housekeeping or managing your Housekeeping? Y     Patient Care Team: Virginia Crews, MD as PCP - General (Family Medicine) Gardiner Barefoot, DPM as Consulting Physician (Podiatry) Virgel Manifold, MD (Inactive) as Consulting Physician (Gastroenterology) [place] (Optometry)  Indicate any recent Medical Services you may have received from other than [facility] providers in the past year (date may be approximate).     Assessment:   This is a routine wellness examination for [place].  Hearing/Vision screen Hearing Screening - Comments:: Wears aids Vision Screening - Comments:: Wears glasses- [facility]  Dietary issues and exercise activities discussed: Current Exercise Habits: Home exercise routine, Type of exercise: walking, Time (Minutes): 20, Frequency (Times/Week): 6, Weekly Exercise  (Minutes/Week): 120, Intensity: Mild   Goals Addressed             This Visit's Progress    DIET - EAT MORE FRUITS AND VEGETABLES         Depression Screen    06/07/2022  10:20 AM 04/24/2022    8:26 AM 11/22/2021   10:49 AM 10/17/2021    8:24 AM 06/06/2021    9:43 AM 11/01/2020    9:30 AM 04/12/2020    8:59 AM  PHQ 2/9 Scores  PHQ - 2 Score 1 0 0 0 0 0 0  PHQ- 9 Score 1 0 0   0 0    Fall Risk    06/07/2022   10:22 AM 04/24/2022    8:26 AM 11/22/2021   10:48 AM 10/17/2021    8:24 AM 06/06/2021    9:45 AM  Fall Risk   Falls in the past year? 1 1 0 1 1  Number falls in past yr: 0 0 0 0 0  Injury with Fall? 0 0 0 0 0  Risk for fall due to : History of fall(s) No Fall Risks No Fall Risks  History of fall(s)  Follow up Falls evaluation completed;Falls prevention discussed Falls evaluation completed Falls evaluation completed  Falls prevention discussed    FALL RISK PREVENTION PERTAINING TO THE HOME:  Any stairs in or around the home? Yes  If so, are there any without handrails? No  Home free of loose throw rugs in walkways, pet beds, electrical cords, etc? Yes  Adequate lighting in your home to reduce risk of falls? Yes   ASSISTIVE DEVICES UTILIZED TO PREVENT FALLS:  Life alert? No  Use of a cane, walker or w/c? No  Grab bars in the bathroom? Yes  Shower chair or bench in shower? Yes  Elevated toilet seat or a handicapped toilet? No   Cognitive Function:declined 2023        05/31/2020   10:03 AM 03/31/2019   10:11 AM  6CIT Screen  What Year? 0 points 0 points  What month? 3 points 0 points  What time? 0 points 0 points  Count back from 20 0 points 0 points  Months in reverse 4 points 0 points  Repeat phrase 4 points 2 points  Total Score 11 points 2 points    Immunizations Immunization History  Administered Date(s) Administered   Fluad Quad(high Dose 65+) 04/24/2022   Influenza,inj,Quad PF,6+ Mos 03/26/2018, 04/05/2021   Influenza-Unspecified  03/30/2019, 03/19/2020   Moderna Sars-Covid-2 Vaccination 09/17/2019, 10/15/2019   PNEUMOCOCCAL CONJUGATE-20 04/24/2022   Pneumococcal Polysaccharide-23 03/26/2018   Tdap 10/20/2010, 03/30/2019    TDAP status: Up to date  Flu Vaccine status: Up to date  Pneumococcal vaccine status: Due, Education has been provided regarding the importance of this vaccine. Advised may receive this vaccine at local pharmacy or Health Dept. Aware to provide a copy of the vaccination record if obtained from local pharmacy or Health Dept. Verbalized acceptance and understanding.  Covid-19 vaccine status: Completed vaccines  Qualifies for Shingles Vaccine? Yes   Zostavax completed No   Shingrix Completed?: No.    Education has been provided regarding the importance of this vaccine. Patient has been advised to call insurance company to determine out of pocket expense if they have not yet received this vaccine. Advised may also receive vaccine at local pharmacy or Health Dept. Verbalized acceptance and understanding.  Screening Tests Health Maintenance  Topic Date Due   Zoster Vaccines- Shingrix (1 of 2) Never done   COVID-19 Vaccine (3 - Moderna risk series) 11/12/2019   PAP SMEAR-Modifier  03/26/2021   DEXA SCAN  Never done   FOOT EXAM  07/11/2022   Diabetic kidney evaluation - Urine ACR  10/18/2022   HEMOGLOBIN A1C  10/23/2022  OPHTHALMOLOGY EXAM  04/11/2023   Diabetic kidney evaluation - eGFR measurement  05/09/2023   Medicare Annual Wellness (AWV)  06/08/2023   MAMMOGRAM  12/06/2023   COLONOSCOPY (Pts 45-40yr Insurance coverage will need to be confirmed)  04/24/2028   DTaP/Tdap/Td (3 - Td or Tdap) 03/29/2029   Pneumonia Vaccine 65 Years old  Completed   INFLUENZA VACCINE  Completed   Hepatitis C Screening  Completed   HIV Screening  Completed   HPV VACCINES  Aged Out    Health Maintenance  Health Maintenance Due  Topic Date Due   Zoster Vaccines- Shingrix (1 of 2) Never done   COVID-19  Vaccine (3 - Moderna risk series) 11/12/2019   PAP SMEAR-Modifier  03/26/2021   DEXA SCAN  Never done    Colorectal cancer screening: Type of screening: Colonoscopy. Completed 04/24/18. Repeat every 10 years  Mammogram status: Completed 12/05/21. Repeat every year  Declined BDS  Lung Cancer Screening: (Low Dose CT Chest recommended if Age 702-80years, 30 pack-year currently smoking OR have quit w/in 15years.) does not qualify.   Additional Screening:  Hepatitis C Screening: does qualify; Completed 09/25/17  Vision Screening: Recommended annual ophthalmology exams for early detection of glaucoma and other disorders of the eye. Is the patient up to date with their annual eye exam?  Yes  Who is the provider or what is the name of the office in which the patient attends annual eye exams[place] [facility] [facility] not established with a provider, would they like to be referred to a provider to establish care? No .   Dental Screening: Recommended annual dental exams for proper oral hygiene  Community Resource Referral / Chronic Care Management: CRR required this visit?  No   CCM required this visit?  No      Plan:     I have personally reviewed and noted the following in the patient's chart:   Medical and social history Use of alcohol, tobacco or illicit drugs  Current medications and supplements including opioid prescriptions. Patient is not currently taking opioid prescriptions. Functional ability and status Nutritional status Physical activity Advanced directives List of other physicians Hospitalizations, surgeries, and ER visits in previous 12 months Vitals Screenings to include cognitive, depression, and falls Referrals and appointments  In addition, I have reviewed and discussed with patient certain preventive protocols, quality metrics, and best practice recommendations. A written personalized care plan for preventive services as well as general preventive health  recommendations were provided to patient.     LDionisio David LPN   [phone]  Nurse Notes: none

## 2022-06-09 ENCOUNTER — Other Ambulatory Visit: Payer: Self-pay | Admitting: Family Medicine

## 2022-06-09 DIAGNOSIS — E785 Hyperlipidemia, unspecified: Secondary | ICD-10-CM | POA: Diagnosis not present

## 2022-06-10 LAB — COMPREHENSIVE METABOLIC PANEL
ALT: 14 IU/L (ref 0–32)
AST: 10 IU/L (ref 0–40)
Albumin/Globulin Ratio: 1.7 (ref 1.2–2.2)
Albumin: 4 g/dL (ref 3.9–4.9)
Alkaline Phosphatase: 146 IU/L — ABNORMAL HIGH (ref 44–121)
BUN/Creatinine Ratio: 18 (ref 12–28)
BUN: 13 mg/dL (ref 8–27)
Bilirubin Total: 0.4 mg/dL (ref 0.0–1.2)
CO2: 23 mmol/L (ref 20–29)
Calcium: 9.7 mg/dL (ref 8.7–10.3)
Chloride: 99 mmol/L (ref 96–106)
Creatinine, Ser: 0.73 mg/dL (ref 0.57–1.00)
Globulin, Total: 2.4 g/dL (ref 1.5–4.5)
Glucose: 137 mg/dL — ABNORMAL HIGH (ref 70–99)
Potassium: 4.8 mmol/L (ref 3.5–5.2)
Sodium: 136 mmol/L (ref 134–144)
Total Protein: 6.4 g/dL (ref 6.0–8.5)
eGFR: 91 mL/min/{1.73_m2} (ref >59)

## 2022-06-16 DIAGNOSIS — M722 Plantar fascial fibromatosis: Secondary | ICD-10-CM | POA: Diagnosis not present

## 2022-06-22 ENCOUNTER — Telehealth: Payer: Self-pay

## 2022-06-22 NOTE — Telephone Encounter (Signed)
Labs are stable.  I don't know why no one reviewed while I was off last week, so apologize to her for the delay.

## 2022-06-22 NOTE — Telephone Encounter (Signed)
See other phone note

## 2022-06-22 NOTE — Telephone Encounter (Signed)
Patient's sister advised.

## 2022-06-22 NOTE — Telephone Encounter (Signed)
Agent spoke with pt.  Lab results from 12/22 have not yet reviewed by provider.   Please advise.   Pt would like a call back when provider's note is done.

## 2022-06-22 NOTE — Telephone Encounter (Signed)
Patient checking on the status of 06/09/2022 lab results

## 2022-06-22 NOTE — Telephone Encounter (Signed)
Copied from [place] [id number]. Topic: General - Other >> Jun 20, 2022  4:10 PM Chapman Fitch wrote: Reason for CRM: Pt would like a call to go over lab result / please advise >> Jun 22, 2022  8:50 AM Devoria Glassing wrote: Pt still not heard about her lab work. Please call

## 2022-07-02 ENCOUNTER — Ambulatory Visit
Admission: EM | Admit: 2022-07-02 | Discharge: 2022-07-02 | Disposition: A | Discharge status: Home / Self Care | Payer: Medicare Other | Attending: Emergency Medicine | Admitting: Emergency Medicine

## 2022-07-02 DIAGNOSIS — S61212A Laceration without foreign body of right middle finger without damage to nail, initial encounter: Secondary | ICD-10-CM

## 2022-07-02 NOTE — ED Provider Notes (Signed)
Renaldo Fiddler    CSN: [id number] Arrival date & time: 07/02/22  0802      History   Chief Complaint Chief Complaint  Patient presents with   Laceration    HPI Emma Fletcher is a 67 y.o. female.   Accompanied by her sister who is her guardian, patient presents with a laceration on her right middle finger that occurred last night when she accidentally cut her finger on a broken glass while washing dishes.  Bleeding controlled.  No numbness, weakness, paresthesias, fever, drainage, or other symptoms.  Her medical history includes diabetes, hypertension, cardiomyopathy, CKD, COPD, developmental delay.   Last tetanus 03/30/2019.   The history is provided by the patient, a relative and medical records.    Past Medical History:  Diagnosis Date   Anemia    Bronchitis    Cardiomyopathy secondary    Developmental delay    Diabetes mellitus without complication (HCC)    Edema    Hypercholesterolemia    Unspecified essential hypertension     Patient Active Problem List   Diagnosis Date Noted   Peroneal tendinitis, right 07/18/2021   Obesity 07/11/2021   Postprandial abdominal pain in right upper quadrant 07/11/2021   Peroneal tendinitis, right leg 04/25/2021   Callus 07/26/2020   Allergic rhinitis 05/20/2020   Trochanteric bursitis of right hip 02/26/2020   Bone pain 09/15/2019   Chronic obstructive pulmonary disease (HCC) 06/23/2019   Hyperlipidemia associated with type 2 diabetes mellitus (HCC) 09/20/2017   Hypothyroidism 09/20/2017   CKD (chronic kidney disease), stage II 05/16/2016   T2DM (type 2 diabetes mellitus) (HCC) 12/18/2015   Hyponatremia 12/18/2015   Anemia of chronic disease 12/18/2015   Hypertension associated with diabetes (HCC) 11/10/2008   Secondary cardiomyopathy (HCC) 11/10/2008    Past Surgical History:  Procedure Laterality Date   COLONOSCOPY     COLONOSCOPY WITH PROPOFOL N/A 04/24/2018   Procedure: COLONOSCOPY WITH PROPOFOL;  Surgeon:  Wyline Mood, MD;  Location: [facility] ENDOSCOPY;  Service: Gastroenterology;  Laterality: N/A;   none      OB History     Gravida  0   Para  0   Term  0   Preterm  0   AB  0   Living  0      SAB  0   IAB  0   Ectopic  0   Multiple  0   Live Births  0            Home Medications    Prior to Admission medications   Medication Sig Start Date End Date Taking? Authorizing Provider  atorvastatin (LIPITOR) 20 MG tablet TAKE 1 TABLET EVERY DAY 12/08/21   Bacigalupo, Marzella Schlein, MD  Blood Glucose Monitoring Suppl (ONE TOUCH ULTRA 2) w/Device KIT Use as directed to check blood sugar once daily 03/26/18   Erasmo Downer, MD  Cholecalciferol (VITAMIN D3) 125 MCG (5000 UT) CAPS TAKE 1 CAPSULE EVERY DAY 05/29/22   Bacigalupo, Marzella Schlein, MD  Ferrous Sulfate (IRON) 325 (65 Fe) MG TABS Take 2 tablets by mouth daily.    [provider]  furosemide (LASIX) 20 MG tablet TAKE 1 TABLET EVERY DAY 12/08/21   Bacigalupo, Marzella Schlein, MD  glucose blood (ONE TOUCH ULTRA TEST) test strip Use as instructed to check blood glucose daily 03/26/18   Erasmo Downer, MD  lansoprazole (PREVACID) 30 MG capsule TAKE 1 CAPSULE EVERY DAY 03/08/22   Caro Laroche, DO  levothyroxine (SYNTHROID) 100  MCG tablet TAKE 1 TABLET EVERY DAY 05/29/22   Erasmo Downer, MD  lisinopril (ZESTRIL) 20 MG tablet TAKE 1 TABLET EVERY DAY 03/08/22   Caro Laroche, DO  metFORMIN (GLUCOPHAGE-XR) 500 MG 24 hr tablet Take 2 tablets (1,000 mg total) by mouth daily with breakfast. 02/01/22   Caro Laroche, DO  Multiple Vitamins-Minerals (ONE-A-DAY WOMENS 50 PLUS PO) Take 1 tablet by mouth daily.    [provider]  Dola Argyle LANCETS 33G MISC Use as directed to check blood glucose daily 03/26/18   Bacigalupo, Marzella Schlein, MD  OZEMPIC, 1 MG/DOSE, 4 MG/3ML SOPN INJECT 1MG  SUBCUTANEOUSLY ONE TIME WEEKLY AS DIRECTED 03/08/22   03/10/22, DO  polyethylene glycol powder (GLYCOLAX/MIRALAX) 17  GM/SCOOP powder Please use one cup of miralax daily. 02/26/19   04/28/19, MD  potassium chloride (KLOR-CON M) 10 MEQ tablet TAKE 1 TABLET TWICE DAILY Patient not taking: Reported on 06/07/2022 12/08/21   12/10/21, MD  [facility] HANDIHALER 18 MCG inhalation capsule INHALE THE CONTENTS OF 1 CAPSULE EVERY DAY AS DIRECTED 12/08/21   12/10/21, MD  SYMBICORT 160-4.5 MCG/ACT inhaler INHALE 2 PUFFS TWICE DAILY 12/08/21   Bacigalupo, 12/10/21, MD  vitamin B-12 (CYANOCOBALAMIN) 1000 MCG tablet Take 1,000 mcg by mouth daily.    [provider]    Family History Family History  Problem Relation Age of Onset   Diabetes Mother    Hypertension Mother    Stroke Mother    Diabetes Father    Coronary artery disease Father    Breast cancer Maternal Aunt    Lung cancer Maternal Uncle    Prostate cancer Paternal Uncle    Uterine cancer Other     Social History Social History   Tobacco Use   Smoking status: Never   Smokeless tobacco: Never  Vaping Use   Vaping Use: Never used  Substance Use Topics   Alcohol use: No    Alcohol/week: 0.0 standard drinks of alcohol   Drug use: No     Allergies   Patient has no known allergies.   Review of Systems Review of Systems  Constitutional:  Negative for chills and fever.  Musculoskeletal:  Negative for arthralgias and joint swelling.  Skin:  Positive for wound. Negative for color change.  Neurological:  Negative for weakness and numbness.  All other systems reviewed and are negative.    Physical Exam Triage Vital Signs ED Triage Vitals  Enc Vitals Group     BP      Pulse      Resp      Temp      Temp src      SpO2      Weight      Height      Head Circumference      Peak Flow      Pain Score      Pain Loc      Pain Edu?      Excl. in GC?    No data found.  Updated Vital Signs BP 138/86   Pulse 76   Temp 98 F (36.7 C)   Resp 18   Ht 5\' 2"  (1.575 m)   LMP  (LMP Unknown)   SpO2 97%    BMI 35.85 kg/m   Visual Acuity Right Eye Distance:   Left Eye Distance:   Bilateral Distance:    Right Eye Near:   Left Eye Near:    Bilateral  Near:     Physical Exam Vitals and nursing note reviewed.  Constitutional:      General: She is not in acute distress.    Appearance: Normal appearance. She is well-developed. She is not ill-appearing.  HENT:     Mouth/Throat:     Mouth: Mucous membranes are moist.  Cardiovascular:     Rate and Rhythm: Normal rate and regular rhythm.  Pulmonary:     Effort: Pulmonary effort is normal. No respiratory distress.  Musculoskeletal:        General: No swelling or deformity. Normal range of motion.     Cervical back: Neck supple.  Skin:    General: Skin is warm and dry.     Capillary Refill: Capillary refill takes less than 2 seconds.     Findings: Lesion present. No erythema.  Neurological:     Mental Status: She is alert.     Sensory: No sensory deficit.     Motor: No weakness.  Psychiatric:        Mood and Affect: Mood normal.        Behavior: Behavior normal.      UC Treatments / Results  Labs (all labs ordered are listed, but only abnormal results are displayed) Labs Reviewed - No data to display  EKG   Radiology No results found.  Procedures Laceration Repair  Date/Time: 07/02/2022 8:19 AM  Performed by: Sharion Balloon, NP Authorized by: Sharion Balloon, NP   Consent:    Consent obtained:  Verbal   Consent given by:  Patient and guardian   Risks discussed:  Infection, pain, poor cosmetic result and poor wound healing Universal protocol:    Procedure explained and questions answered to patient or proxy's satisfaction: yes   Laceration details:    Location:  Finger   Finger location:  R long finger   Length (cm):  2   Depth (mm):  2 Pre-procedure details:    Preparation:  Patient was prepped and draped in usual sterile fashion Exploration:    Hemostasis achieved with:  Direct pressure   Imaging outcome:  foreign body not noted     Wound exploration: wound explored through full range of motion and entire depth of wound visualized   Treatment:    Area cleansed with:  Povidone-iodine   Amount of cleaning:  Standard   Irrigation solution:  Sterile saline   Irrigation method:  Syringe   Visualized foreign bodies/material removed: no   Skin repair:    Repair method:  Sutures   Suture size:  4-0   Suture material:  Nylon   Suture technique:  Simple interrupted   Number of sutures:  3 Approximation:    Approximation:  Close Repair type:    Repair type:  Simple Post-procedure details:    Dressing:  Antibiotic ointment and non-adherent dressing   Procedure completion:  Tolerated well, no immediate complications  (including critical care time)  Medications Ordered in UC Medications - No data to display  Initial Impression / Assessment and Plan / UC Course  I have reviewed the triage vital signs and the nursing notes.  Pertinent labs & imaging results that were available during my care of the patient were reviewed by me and considered in my medical decision making (see chart for details).    Laceration of right middle finger.  3 sutures.  Tetanus up-to-date.   Instructed patient to return here for suture removal in 7 to 10 days.  Wound care instructions and signs  of infection discussed.  Instructed patient to follow-up right away if she notes signs of infection.  Education provided on laceration care.  Patient and her sister agree with plan of care.       Final Clinical Impressions(s) / UC Diagnoses   Final diagnoses:  Laceration of right middle finger without foreign body without damage to nail, initial encounter     Discharge Instructions      Keep your wound clean and dry.  Wash it gently twice a day with soap and water.    Your stitches need to be removed in 7-10 days.    Follow up right away if you see signs of infection, such as increased pain, redness, pus-like drainage,  warmth, fever, chills, or other concerning symptoms.        ED Prescriptions   None    PDMP not reviewed this encounter.   Sharion Balloon, NP 07/02/22 [phone]

## 2022-07-02 NOTE — Discharge Instructions (Addendum)
Keep your wound clean and dry.  Wash it gently twice a day with soap and water.    Your stitches need to be removed in 7-10 days.    Follow up right away if you see signs of infection, such as increased pain, redness, pus-like drainage, warmth, fever, chills, or other concerning symptoms.

## 2022-07-02 NOTE — ED Triage Notes (Signed)
Patient to Urgent Care with sister, complaints of laceration present to right middle finger. Reports cutting her finger yesterday on a piece of broken glass when washing dishes.   Band aid in place with bleeding well controlled at this time.   Tdap: 03/30/2019

## 2022-07-05 ENCOUNTER — Telehealth: Payer: Self-pay | Admitting: *Deleted

## 2022-07-05 DIAGNOSIS — H6063 Unspecified chronic otitis externa, bilateral: Secondary | ICD-10-CM | POA: Diagnosis not present

## 2022-07-05 NOTE — Telephone Encounter (Signed)
Patient has laceration/stiches on second digit of hand- UC was able to excuse from work for 3 days- but she needs longer at least until stitches out. 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 Plans on getting stitches removed this weekend. Can PCP write her a note- or does she need an appointment for that. Patient does not have transportation- virtual would be difficult- patient is handicapped and has to have assistance.. Notes are in chart. Can call June at her number if PCP has questions.

## 2022-07-06 NOTE — Telephone Encounter (Signed)
Letter printed for patient. Ms June reports letter is no longer needed. Work has excused patient for the rest of the week.

## 2022-07-06 NOTE — Telephone Encounter (Signed)
Ok to provide her a work note to return on 07/10/22.

## 2022-07-09 ENCOUNTER — Ambulatory Visit
Admission: RE | Admit: 2022-07-09 | Discharge: 2022-07-09 | Disposition: A | Discharge status: Home / Self Care | Payer: Medicare Other | Source: Ambulatory Visit | Attending: Internal Medicine | Admitting: Internal Medicine

## 2022-07-09 DIAGNOSIS — S61212A Laceration without foreign body of right middle finger without damage to nail, initial encounter: Secondary | ICD-10-CM | POA: Diagnosis not present

## 2022-07-09 NOTE — ED Notes (Signed)
Patient her for suture removal for laceration on 07/02/2022.

## 2022-07-09 NOTE — ED Notes (Signed)
3 sutures removed from the patients right middle finger. Pt. Tolerated removal well, no reinforcement. Pt. Went home with suture removal care

## 2022-07-17 ENCOUNTER — Ambulatory Visit: Payer: Medicare HMO | Admitting: Podiatry

## 2022-07-20 ENCOUNTER — Other Ambulatory Visit: Payer: Self-pay | Admitting: Family Medicine

## 2022-07-20 MED ORDER — METFORMIN HCL ER 500 MG PO TB24: 1000.0000 mg | ORAL_TABLET | Freq: Every day | ORAL | 0 refills | Status: DC

## 2022-07-20 MED ORDER — OZEMPIC (1 MG/DOSE) 4 MG/3ML ~~LOC~~ SOPN: PEN_INJECTOR | SUBCUTANEOUS | 0 refills | Status: DC

## 2022-07-20 NOTE — Telephone Encounter (Signed)
Pt called saying she needs her two diabetes medications sent to the pharmacy.  She said she did call the pharmacy.  Ozempic and her Metformin.  She uses [facility]  Mail order   CB#  [phone]

## 2022-07-20 NOTE — Telephone Encounter (Signed)
Requested Prescriptions  Pending Prescriptions Disp Refills   metFORMIN (GLUCOPHAGE-XR) 500 MG 24 hr tablet 180 tablet 0    Sig: Take 2 tablets (1,000 mg total) by mouth daily with breakfast.     Endocrinology:  Diabetes - Biguanides Failed - 07/20/2022  9:43 AM      Failed - CBC within normal limits and completed in the last 12 months    WBC  Date Value Ref Range Status  04/24/2022 10.1 3.4 - 10.8 x10E3/uL Final  02/13/2019 10.7 (H) 4.0 - 10.5 K/uL Final   RBC  Date Value Ref Range Status  04/24/2022 3.63 (L) 3.77 - 5.28 x10E6/uL Final  02/13/2019 3.79 (L) 3.87 - 5.11 MIL/uL Final   Hemoglobin  Date Value Ref Range Status  04/24/2022 11.1 11.1 - 15.9 g/dL Final   Hematocrit  Date Value Ref Range Status  04/24/2022 32.9 (L) 34.0 - 46.6 % Final   MCHC  Date Value Ref Range Status  04/24/2022 33.7 31.5 - 35.7 g/dL Final  02/13/2019 33.3 30.0 - 36.0 g/dL Final   [facility]  Date Value Ref Range Status  04/24/2022 30.6 26.6 - 33.0 pg Final  02/13/2019 29.6 26.0 - 34.0 pg Final   MCV  Date Value Ref Range Status  04/24/2022 91 79 - 97 fL Final   No results found for: "PLTCOUNTKUC", "LABPLAT", "POCPLA" RDW  Date Value Ref Range Status  04/24/2022 11.5 (L) 11.7 - 15.4 % Final         Passed - Cr in normal range and within 360 days    Creatinine, Ser  Date Value Ref Range Status  06/09/2022 0.73 0.57 - 1.00 mg/dL Final         Passed - HBA1C is between 0 and 7.9 and within 180 days    Hemoglobin A1C  Date Value Ref Range Status  04/24/2022 7.0 (A) 4.0 - 5.6 % Final   Hgb A1c MFr Bld  Date Value Ref Range Status  10/17/2021 7.1 (H) 4.8 - 5.6 % Final    Comment:             Prediabetes: 5.7 - 6.4          Diabetes: >6.4          Glycemic control for adults with diabetes: <7.0          Passed - eGFR in normal range and within 360 days    GFR calc Af Amer  Date Value Ref Range Status  04/12/2020 103 >59 mL/min/1.73 Final    Comment:    **In accordance with  recommendations from the NKF-ASN Task force,**   [facility] is in the process of updating its eGFR calculation to the   2021 CKD-EPI creatinine equation that estimates kidney function   without a race variable.    GFR calc non Af Amer  Date Value Ref Range Status  04/12/2020 89 >59 mL/min/1.73 Final   eGFR  Date Value Ref Range Status  06/09/2022 91 >59 mL/min/1.73 Final         Passed - B12 Level in normal range and within 720 days    Vitamin B-12  Date Value Ref Range Status  04/24/2022 790 232 - 1,245 pg/mL Final         Passed - Valid encounter within last 6 months    Recent Outpatient Visits           2 months ago Type 2 diabetes mellitus with other specified complication, without long-term current use of insulin (  [place])   [place], Dionne Bucy, MD   8 months ago [place], Dionne Bucy, MD   9 months ago Type 2 diabetes mellitus with other specified complication, without long-term current use of insulin ([place])   [place], Dionne Bucy, MD   1 year ago Hypertension associated with diabetes [facility])   [place], Dionne Bucy, MD   1 year ago Type 2 diabetes mellitus with other specified complication, without long-term current use of insulin ([place])   [place], Dionne Bucy, MD       Future Appointments             In 3 months Bacigalupo, Dionne Bucy, MD [place]             Semaglutide, 1 MG/DOSE, (OZEMPIC, 1 MG/DOSE,) 4 MG/3ML SOPN 3 mL 0    Sig: INJECT 1MG  SUBCUTANEOUSLY ONE TIME WEEKLY AS DIRECTED     Endocrinology:  Diabetes - GLP-1 Receptor Agonists - semaglutide Failed - 07/20/2022  9:43 AM      Failed - HBA1C in normal range and within 180 days    Hemoglobin A1C  Date Value Ref Range Status  04/24/2022 7.0 (A) 4.0 - 5.6 % Final   Hgb A1c  MFr Bld  Date Value Ref Range Status  10/17/2021 7.1 (H) 4.8 - 5.6 % Final    Comment:             Prediabetes: 5.7 - 6.4          Diabetes: >6.4          Glycemic control for adults with diabetes: <7.0          Passed - Cr in normal range and within 360 days    Creatinine, Ser  Date Value Ref Range Status  06/09/2022 0.73 0.57 - 1.00 mg/dL Final         Passed - Valid encounter within last 6 months    Recent Outpatient Visits           2 months ago Type 2 diabetes mellitus with other specified complication, without long-term current use of insulin ([place])   [place], Dionne Bucy, MD   8 months ago [place], Dionne Bucy, MD   9 months ago Type 2 diabetes mellitus with other specified complication, without long-term current use of insulin [facility])   [place], Dionne Bucy, MD   1 year ago Hypertension associated with diabetes [facility])   [employer] [place], Dionne Bucy, MD   1 year ago Type 2 diabetes mellitus with other specified complication, without long-term current use of insulin [facility])   [place] Bacigalupo, Dionne Bucy, MD       Future Appointments             In 3 months Bacigalupo, Dionne Bucy, MD [facility], [facility]

## 2022-07-24 DIAGNOSIS — Z7189 Other specified counseling: Secondary | ICD-10-CM | POA: Diagnosis not present

## 2022-07-24 DIAGNOSIS — Z7689 Persons encountering health services in other specified circumstances: Secondary | ICD-10-CM | POA: Diagnosis not present

## 2022-07-24 DIAGNOSIS — Z1389 Encounter for screening for other disorder: Secondary | ICD-10-CM | POA: Diagnosis not present

## 2022-07-24 DIAGNOSIS — Z1331 Encounter for screening for depression: Secondary | ICD-10-CM | POA: Diagnosis not present

## 2022-07-24 DIAGNOSIS — Z0001 Encounter for general adult medical examination with abnormal findings: Secondary | ICD-10-CM | POA: Diagnosis not present

## 2022-07-26 ENCOUNTER — Telehealth: Payer: Self-pay | Admitting: Family Medicine

## 2022-07-26 MED ORDER — METFORMIN HCL ER 500 MG PO TB24: 1000.0000 mg | ORAL_TABLET | Freq: Every day | ORAL | 1 refills | Status: DC

## 2022-07-26 MED ORDER — METFORMIN HCL ER 500 MG PO TB24: 1000.0000 mg | ORAL_TABLET | Freq: Every day | ORAL | 0 refills | Status: DC

## 2022-07-26 NOTE — Addendum Note (Signed)
Addended by: Shawna Orleans on: 07/26/2022 12:02 PM   Modules accepted: Orders

## 2022-07-26 NOTE — Telephone Encounter (Signed)
Patient needs emergent RX sent to [facility] for Metformin and then a 90 day supply sent to [facility].

## 2022-07-26 NOTE — Telephone Encounter (Signed)
June advised medication sent to [facility]

## 2022-07-31 ENCOUNTER — Telehealth: Payer: Self-pay

## 2022-07-31 DIAGNOSIS — E119 Type 2 diabetes mellitus without complications: Secondary | ICD-10-CM

## 2022-07-31 MED ORDER — ONETOUCH DELICA LANCETS 33G MISC: 5 refills | Status: AC

## 2022-07-31 MED ORDER — BUDESONIDE-FORMOTEROL FUMARATE 160-4.5 MCG/ACT IN AERO: 2.0000 | INHALATION_SPRAY | Freq: Two times a day (BID) | RESPIRATORY_TRACT | 3 refills | Status: DC

## 2022-07-31 MED ORDER — LEVOTHYROXINE SODIUM 100 MCG PO TABS: 100.0000 ug | ORAL_TABLET | Freq: Every day | ORAL | 3 refills | Status: DC

## 2022-07-31 MED ORDER — LISINOPRIL 20 MG PO TABS: 20.0000 mg | ORAL_TABLET | Freq: Every day | ORAL | 3 refills | Status: DC

## 2022-07-31 MED ORDER — ATORVASTATIN CALCIUM 20 MG PO TABS: 20.0000 mg | ORAL_TABLET | Freq: Every day | ORAL | 3 refills | Status: DC

## 2022-07-31 MED ORDER — LANSOPRAZOLE 30 MG PO CPDR: 30.0000 mg | DELAYED_RELEASE_CAPSULE | Freq: Every day | ORAL | 3 refills | Status: DC

## 2022-07-31 MED ORDER — SPIRIVA HANDIHALER 18 MCG IN CAPS: ORAL_CAPSULE | RESPIRATORY_TRACT | 1 refills | Status: DC

## 2022-07-31 MED ORDER — OZEMPIC (1 MG/DOSE) 4 MG/3ML ~~LOC~~ SOPN: PEN_INJECTOR | SUBCUTANEOUS | 0 refills | Status: DC

## 2022-07-31 MED ORDER — FUROSEMIDE 20 MG PO TABS: 20.0000 mg | ORAL_TABLET | Freq: Every day | ORAL | 3 refills | Status: DC

## 2022-07-31 NOTE — Telephone Encounter (Signed)
Copied from [place] [id number]. Topic: General - Other >> Jul 31, 2022 10:36 AM Oley Balm A wrote: Reason for CRM: June states that all of the pt medications was suppose to be sent to [place], [place] Phone: [phone] Fax: [phone]  Per June the only medication that was sent to [facility] was for the Metformin.  June is wanting all of the pt prescriptions to be sent to [facility].  Please call June back.

## 2022-09-11 ENCOUNTER — Ambulatory Visit (INDEPENDENT_AMBULATORY_CARE_PROVIDER_SITE_OTHER): Payer: Medicare Other | Admitting: Podiatry

## 2022-09-11 ENCOUNTER — Encounter: Payer: Self-pay | Admitting: Podiatry

## 2022-09-11 VITALS — BP 131/73 | HR 70

## 2022-09-11 DIAGNOSIS — E119 Type 2 diabetes mellitus without complications: Secondary | ICD-10-CM | POA: Diagnosis not present

## 2022-09-11 DIAGNOSIS — M79676 Pain in unspecified toe(s): Secondary | ICD-10-CM

## 2022-09-11 DIAGNOSIS — B351 Tinea unguium: Secondary | ICD-10-CM

## 2022-09-11 NOTE — Progress Notes (Signed)
  Subjective:  Patient ID: Emma Fletcher, female    DOB: [birth year],  MRN: [id number]  Chief Complaint  Patient presents with   Nail Problem    "Toenails"    67 y.o. female presents with the above complaint. History confirmed with patient. Regular debridement has been helpful in controlling pain from her toenails, she notes thickened elongated nails, says her diabetes is well controlled  Objective:  Physical Exam: warm, good capillary refill, no trophic changes or ulcerative lesions, normal DP and PT pulses, normal monofilament exam, normal sensory exam, and elongated dystrophic toenails  Assessment:   1. Encounter for diabetic foot exam ([place])   2. Pain due to onychomycosis of toenail      Plan:  Patient was evaluated and treated and all questions answered.   Patient educated on diabetes. Discussed proper diabetic foot care and discussed risks and complications of disease. Educated patient in depth on reasons to return to the office immediately should he/she discover anything concerning or new on the feet. All questions answered. Discussed proper shoes as well. Nails debrided in length and thickness with a sharp nail nipper   Return in about 3 months (around 12/12/2022) for at risk diabetic foot care.

## 2022-09-25 ENCOUNTER — Ambulatory Visit: Payer: Medicare HMO | Admitting: Podiatry

## 2022-10-09 ENCOUNTER — Other Ambulatory Visit: Payer: Self-pay | Admitting: Family Medicine

## 2022-10-09 NOTE — Telephone Encounter (Signed)
Medication Refill - Medication: metFORMIN (GLUCOPHAGE-XR) 500 MG 24 hr tablet ,  Pt requested a medication that starts with a V, I asked if vitamin pt said NO, but she could not pronounce it or spell it. It was very difficult to understand her. She stated she had three tablets left.  Has the patient contacted their pharmacy? No. (Agent: If no, request that the patient contact the pharmacy for the refill. If patient does not wish to contact the pharmacy document the reason why and proceed with request.)   Preferred Pharmacy (with phone number or street name):  [facility] - [place] - [address]  [address] [place] [address]  Phone: [phone] Fax: [phone]  Hours: Not open 24 hours   Has the patient been seen for an appointment in the last year OR does the patient have an upcoming appointment? Yes.    Agent: Please be advised that RX refills may take up to 3 business days. We ask that you follow-up with your pharmacy.

## 2022-10-10 NOTE — Telephone Encounter (Signed)
Unable to refill per protocol, Rx request is too soon. Last refill 07/26/22 for 90 days and 1 refill.  Requested Prescriptions  Pending Prescriptions Disp Refills   metFORMIN (GLUCOPHAGE-XR) 500 MG 24 hr tablet 180 tablet 1    Sig: Take 2 tablets (1,000 mg total) by mouth daily with breakfast.     Endocrinology:  Diabetes - Biguanides Failed - 10/09/2022  4:35 PM      Failed - CBC within normal limits and completed in the last 12 months    WBC  Date Value Ref Range Status  04/24/2022 10.1 3.4 - 10.8 x10E3/uL Final  02/13/2019 10.7 (H) 4.0 - 10.5 K/uL Final   RBC  Date Value Ref Range Status  04/24/2022 3.63 (L) 3.77 - 5.28 x10E6/uL Final  02/13/2019 3.79 (L) 3.87 - 5.11 MIL/uL Final   Hemoglobin  Date Value Ref Range Status  04/24/2022 11.1 11.1 - 15.9 g/dL Final   Hematocrit  Date Value Ref Range Status  04/24/2022 32.9 (L) 34.0 - 46.6 % Final   MCHC  Date Value Ref Range Status  04/24/2022 33.7 31.5 - 35.7 g/dL Final  09/81/1914 78.2 30.0 - 36.0 g/dL Final   [facility]  Date Value Ref Range Status  04/24/2022 30.6 26.6 - 33.0 pg Final  02/13/2019 29.6 26.0 - 34.0 pg Final   MCV  Date Value Ref Range Status  04/24/2022 91 79 - 97 fL Final   No results found for: "PLTCOUNTKUC", "LABPLAT", "POCPLA" RDW  Date Value Ref Range Status  04/24/2022 11.5 (L) 11.7 - 15.4 % Final         Passed - Cr in normal range and within 360 days    Creatinine, Ser  Date Value Ref Range Status  06/09/2022 0.73 0.57 - 1.00 mg/dL Final         Passed - HBA1C is between 0 and 7.9 and within 180 days    Hemoglobin A1C  Date Value Ref Range Status  04/24/2022 7.0 (A) 4.0 - 5.6 % Final   Hgb A1c MFr Bld  Date Value Ref Range Status  10/17/2021 7.1 (H) 4.8 - 5.6 % Final    Comment:             Prediabetes: 5.7 - 6.4          Diabetes: >6.4          Glycemic control for adults with diabetes: <7.0          Passed - eGFR in normal range and within 360 days    GFR calc Af Amer  Date  Value Ref Range Status  04/12/2020 103 >59 mL/min/1.73 Final    Comment:    **In accordance with recommendations from the NKF-ASN Task force,**   [facility] is in the process of updating its eGFR calculation to the   2021 CKD-EPI creatinine equation that estimates kidney function   without a race variable.    GFR calc non Af Amer  Date Value Ref Range Status  04/12/2020 89 >59 mL/min/1.73 Final   eGFR  Date Value Ref Range Status  06/09/2022 91 >59 mL/min/1.73 Final         Passed - B12 Level in normal range and within 720 days    Vitamin B-12  Date Value Ref Range Status  04/24/2022 790 232 - 1,245 pg/mL Final         Passed - Valid encounter within last 6 months    Recent Outpatient Visits  5 months ago Type 2 diabetes mellitus with other specified complication, without long-term current use of insulin [facility])   [place] [facility] [place], Marzella Schlein, MD   10 months ago Abscess   [place] [facility] [place], Marzella Schlein, MD   11 months ago Type 2 diabetes mellitus with other specified complication, without long-term current use of insulin [facility])   [facility] [place], Marzella Schlein, MD   1 year ago Hypertension associated with diabetes [facility])   [place] [facility] [place], Marzella Schlein, MD   1 year ago Type 2 diabetes mellitus with other specified complication, without long-term current use of insulin [facility])   [place] [facility] Bacigalupo, Marzella Schlein, MD       Future Appointments             In 2 weeks Bacigalupo, Marzella Schlein, MD [facility], [facility]

## 2022-10-12 ENCOUNTER — Other Ambulatory Visit: Payer: Self-pay | Admitting: Family Medicine

## 2022-10-12 MED ORDER — VITAMIN D3 125 MCG (5000 UT) PO CAPS: 1.0000 | ORAL_CAPSULE | Freq: Every day | ORAL | 3 refills | Status: DC

## 2022-10-12 NOTE — Telephone Encounter (Signed)
Medication Refill - Medication: Cholecalciferol (VITAMIN D3) 125 MCG (5000 UT) CAPS   Has the patient contacted their pharmacy? Yes.     Preferred Pharmacy (with phone number or street name):  [facility] - [place], [place] - [address] Phone: [phone]  Fax: [phone]     Has the patient been seen for an appointment in the last year OR does the patient have an upcoming appointment? Yes.    Please assist patient further

## 2022-10-12 NOTE — Telephone Encounter (Signed)
Requested medication (s) are due for refill today:   Provider to review  Requested medication (s) are on the active medication list:   Yes  Future visit scheduled:   Yes   Last ordered: 05/29/2022 #90, 3 refills  Non delegated refill    Requested Prescriptions  Pending Prescriptions Disp Refills   Cholecalciferol (VITAMIN D3) 125 MCG (5000 UT) CAPS 90 capsule 3    Sig: Take 1 capsule (5,000 Units total) by mouth daily.     Endocrinology:  Vitamins - Vitamin D Supplementation 2 Failed - 10/12/2022 10:07 AM      Failed - Manual Review: Route requests for 50,000 IU strength to the provider      Failed - Vitamin D in normal range and within 360 days    Vit D, 25-Hydroxy  Date Value Ref Range Status  04/05/2021 67.9 30.0 - 100.0 ng/mL Final    Comment:    Vitamin D deficiency has been defined by the Institute of Medicine and an Endocrine Society practice guideline as a level of serum 25-OH vitamin D less than 20 ng/mL (1,2). The Endocrine Society went on to further define vitamin D insufficiency as a level between 21 and 29 ng/mL (2). 1. IOM (Institute of Medicine). 2010. Dietary reference    intakes for calcium and D. [place]: The    [employer]. 2. Holick MF, Binkley [place], Bischoff-Ferrari HA, et al.    Evaluation, treatment, and prevention of vitamin D    deficiency: an Endocrine Society clinical practice    guideline. JCEM. 2011 Jul; 96(7):1911-30.          Passed - Ca in normal range and within 360 days    Calcium  Date Value Ref Range Status  06/09/2022 9.7 8.7 - 10.3 mg/dL Final         Passed - Valid encounter within last 12 months    Recent Outpatient Visits           5 months ago Type 2 diabetes mellitus with other specified complication, without long-term current use of insulin [facility])   [place] [facility] [place], Marzella Schlein, MD   10 months ago Abscess   [facility] [place], Marzella Schlein, MD   12  months ago Type 2 diabetes mellitus with other specified complication, without long-term current use of insulin [facility])   [place] [facility] [place], Marzella Schlein, MD   1 year ago Hypertension associated with diabetes [facility])   [place] [facility] [place], Marzella Schlein, MD   1 year ago Type 2 diabetes mellitus with other specified complication, without long-term current use of insulin [facility])   [facility] [place], Marzella Schlein, MD       Future Appointments             In 2 weeks Bacigalupo, Marzella Schlein, MD [facility], [facility]

## 2022-10-15 ENCOUNTER — Other Ambulatory Visit: Payer: Self-pay | Admitting: Family Medicine

## 2022-10-17 ENCOUNTER — Other Ambulatory Visit: Payer: Self-pay | Admitting: Family Medicine

## 2022-10-23 ENCOUNTER — Other Ambulatory Visit: Payer: Self-pay | Admitting: Family Medicine

## 2022-10-23 NOTE — Telephone Encounter (Signed)
Requested medication (s) are due for refill today: Yes  Requested medication (s) are on the active medication list: Yes  Last refill:  10/12/22  Future visit scheduled: Yes  Notes to clinic:  Unable to refill per protocol due to failed labs, no updated results. Resend to another pharmacy     Requested Prescriptions  Pending Prescriptions Disp Refills   Cholecalciferol (VITAMIN D3) 125 MCG (5000 UT) CAPS 90 capsule 3    Sig: Take 1 capsule (5,000 Units total) by mouth daily. TAKE 1 CAPSULE EVERY DAY     Endocrinology:  Vitamins - Vitamin D Supplementation 2 Failed - 10/23/2022  4:20 PM      Failed - Manual Review: Route requests for 50,000 IU strength to the provider      Failed - Vitamin D in normal range and within 360 days    Vit D, 25-Hydroxy  Date Value Ref Range Status  04/05/2021 67.9 30.0 - 100.0 ng/mL Final    Comment:    Vitamin D deficiency has been defined by the Institute of Medicine and an Endocrine Society practice guideline as a level of serum 25-OH vitamin D less than 20 ng/mL (1,2). The Endocrine Society went on to further define vitamin D insufficiency as a level between 21 and 29 ng/mL (2). 1. IOM (Institute of Medicine). 2010. Dietary reference    intakes for calcium and D. [place]: The    [employer]. 2. Holick MF, Binkley [place], Bischoff-Ferrari HA, et al.    Evaluation, treatment, and prevention of vitamin D    deficiency: an Endocrine Society clinical practice    guideline. JCEM. 2011 Jul; 96(7):1911-30.          Passed - Ca in normal range and within 360 days    Calcium  Date Value Ref Range Status  06/09/2022 9.7 8.7 - 10.3 mg/dL Final         Passed - Valid encounter within last 12 months    Recent Outpatient Visits           6 months ago Type 2 diabetes mellitus with other specified complication, without long-term current use of insulin [facility])   [place] [facility] [place], Marzella Schlein, MD   11 months ago  Abscess   [place] [facility] [place], Marzella Schlein, MD   1 year ago Type 2 diabetes mellitus with other specified complication, without long-term current use of insulin [facility])   [place] [facility], Marzella Schlein, MD   1 year ago Hypertension associated with diabetes [facility])   [place] [facility] [place], Marzella Schlein, MD   1 year ago Type 2 diabetes mellitus with other specified complication, without long-term current use of insulin [facility])   [place] [facility] Bacigalupo, Marzella Schlein, MD       Future Appointments             In 1 week Bacigalupo, Marzella Schlein, MD [facility], [facility]   In 2 weeks Bacigalupo, Marzella Schlein, MD [facility], [facility]

## 2022-10-23 NOTE — Telephone Encounter (Signed)
Medication Refill - Medication: Cholecalciferol (VITAMIN D3) 125 MCG (5000 UT) CAPS  [facility]  Has the patient contacted their pharmacy? yes (Agent: If no, request that the patient contact the pharmacy for the refill. If patient does not wish to contact the pharmacy document the reason why and proceed with request.) (Agent: If yes, when and what did the pharmacy advise?)contact pcp  Preferred Pharmacy (with phone number or street name):  [id number] - [place], [place] - [address] AT [facility] [address] Phone: [phone]  Fax: [phone]     Has the patient been seen for an appointment in the last year OR does the patient have an upcoming appointment? yes  Agent: Please be advised that RX refills may take up to 3 business days. We ask that you follow-up with your pharmacy.

## 2022-10-24 ENCOUNTER — Other Ambulatory Visit: Payer: Self-pay

## 2022-10-24 MED ORDER — VITAMIN D3 125 MCG (5000 UT) PO CAPS: 1.0000 | ORAL_CAPSULE | Freq: Every day | ORAL | 3 refills | Status: AC

## 2022-10-30 ENCOUNTER — Ambulatory Visit: Payer: Medicare Other | Admitting: Family Medicine

## 2022-10-30 NOTE — Progress Notes (Signed)
I,Shimon Trowbridge S Korinna Tat,acting as a scribe for Shirlee Latch, MD.,have documented all relevant documentation on the behalf of Shirlee Latch, MD,as directed by  Shirlee Latch, MD while in the presence of Shirlee Latch, MD.     Established patient visit   Patient: Emma Fletcher   DOB: Nov 04, 1955   67 y.o. Female  MRN: [id number] Visit Date: 10/31/2022  Today's healthcare provider: Shirlee Latch, MD   No chief complaint on file.  Subjective    HPI  Diabetes Mellitus Type II, follow-up  Lab Results  Component Value Date   HGBA1C 7.0 (A) 04/24/2022   HGBA1C 7.1 (H) 10/17/2021   HGBA1C 6.9 (A) 07/11/2021   Last seen for diabetes 6 months ago.  Management since then includes continuing the same treatment. She reports {excellent/good/fair/poor:19665} compliance with treatment. She {is/is not:21021397} having side effects. {document side effects if present:1}  Home blood sugar records: {diabetes glucometry results:16657}  Episodes of hypoglycemia? {Yes/No:20286} {enter details if yes:1}   Current insulin regiment: none Most Recent Eye Exam: utd  --------------------------------------------------------------------------------------------------- Hypertension, follow-up  BP Readings from Last 3 Encounters:  09/11/22 131/73  07/09/22 137/83  07/02/22 138/86   Wt Readings from Last 3 Encounters:  06/07/22 196 lb (88.9 kg)  04/24/22 196 lb 6.4 oz (89.1 kg)  11/22/21 190 lb (86.2 kg)     She was last seen for hypertension 6 months ago.  BP at that visit was 129/75. Management since that visit includes no changes. She reports {excellent/good/fair/poor:19665} compliance with treatment. She {is/is not:9024} having side effects. {document side effects if present:1}   Outside blood pressures are {enter patient reported home BP, or 'not being  checked':1}.  --------------------------------------------------------------------------------------------------- Lipid/Cholesterol, follow-up  Last Lipid Panel: Lab Results  Component Value Date   CHOL 164 04/24/2022   LDLCALC 90 04/24/2022   HDL 59 04/24/2022   TRIG 81 04/24/2022    She was last seen for this 6 months ago.  Management since that visit includes no changes.  She reports {excellent/good/fair/poor:19665} compliance with treatment. She {is/is not:9024} having side effects. {document side effects if present:1}  Last metabolic panel Lab Results  Component Value Date   GLUCOSE 137 (H) 06/09/2022   NA 136 06/09/2022   K 4.8 06/09/2022   BUN 13 06/09/2022   CREATININE 0.73 06/09/2022   EGFR 91 06/09/2022   GFRNONAA 89 04/12/2020   CALCIUM 9.7 06/09/2022   AST 10 06/09/2022   ALT 14 06/09/2022   The 10-year ASCVD risk score (Arnett DK, et al., 2019) is: 12.8%  ---------------------------------------------------------------------------------------------------   Medications: Outpatient Medications Prior to Visit  Medication Sig   atorvastatin (LIPITOR) 20 MG tablet Take 1 tablet (20 mg total) by mouth daily.   Blood Glucose Monitoring Suppl (ONE TOUCH ULTRA 2) w/Device KIT Use as directed to check blood sugar once daily   budesonide-formoterol (SYMBICORT) 160-4.5 MCG/ACT inhaler Inhale 2 puffs into the lungs 2 (two) times daily.   Cholecalciferol (VITAMIN D3) 125 MCG (5000 UT) CAPS Take 1 capsule (5,000 Units total) by mouth daily. TAKE 1 CAPSULE EVERY DAY   Ferrous Sulfate (IRON) 325 (65 Fe) MG TABS Take 2 tablets by mouth daily.   furosemide (LASIX) 20 MG tablet Take 1 tablet (20 mg total) by mouth daily.   glucose blood (ONE TOUCH ULTRA TEST) test strip Use as instructed to check blood glucose daily   lansoprazole (PREVACID) 30 MG capsule Take 1 capsule (30 mg total) by mouth daily.   levothyroxine (SYNTHROID) 100 MCG tablet Take 1 tablet (  100 mcg total) by  mouth daily.   lisinopril (ZESTRIL) 20 MG tablet Take 1 tablet (20 mg total) by mouth daily.   metFORMIN (GLUCOPHAGE-XR) 500 MG 24 hr tablet TAKE 2 TABLETS BY MOUTH DAILY WITH BREAKFAST   Multiple Vitamins-Minerals (ONE-A-DAY WOMENS 50 PLUS PO) Take 1 tablet by mouth daily.   OneTouch Delica Lancets 33G MISC Use as directed to check blood glucose daily   polyethylene glycol powder (GLYCOLAX/MIRALAX) 17 GM/SCOOP powder Please use one cup of miralax daily.   potassium chloride (KLOR-CON M) 10 MEQ tablet TAKE 1 TABLET TWICE DAILY   Semaglutide, 1 MG/DOSE, (OZEMPIC, 1 MG/DOSE,) 4 MG/3ML SOPN INJECT 1 MG UNDER THE SKIN ONE TIME WEEKLY AS DIRECTED   SPIRIVA HANDIHALER 18 MCG inhalation capsule INHALE THE CONTENTS OF 1 CAPSULE EVERY DAY AS DIRECTED   vitamin B-12 (CYANOCOBALAMIN) 1000 MCG tablet Take 1,000 mcg by mouth daily.   No facility-administered medications prior to visit.    Review of Systems  Constitutional:  Negative for appetite change and fatigue.  Eyes:  Negative for visual disturbance.  Respiratory:  Negative for chest tightness and shortness of breath.   Cardiovascular:  Negative for chest pain and leg swelling.  Gastrointestinal:  Negative for abdominal pain.  Neurological:  Negative for dizziness and light-headedness.    {Labs  Heme  Chem  Endocrine  Serology  Results Review (optional):23779}   Objective    LMP  (LMP Unknown)  {Show previous vital signs (optional):23777}  Physical Exam  ***  No results found for any visits on 10/31/22.  Assessment & Plan     ***  No follow-ups on file.      {provider attestation***:1}   Shirlee Latch, MD  [facility] [phone] (phone) [phone] (fax)  [facility]

## 2022-10-31 ENCOUNTER — Ambulatory Visit (INDEPENDENT_AMBULATORY_CARE_PROVIDER_SITE_OTHER): Payer: Medicare Other | Admitting: Family Medicine

## 2022-10-31 ENCOUNTER — Encounter: Payer: Self-pay | Admitting: Family Medicine

## 2022-10-31 VITALS — BP 136/77 | HR 66 | Temp 97.9°F | Resp 16 | Wt 193.3 lb

## 2022-10-31 DIAGNOSIS — I152 Hypertension secondary to endocrine disorders: Secondary | ICD-10-CM | POA: Diagnosis not present

## 2022-10-31 DIAGNOSIS — D638 Anemia in other chronic diseases classified elsewhere: Secondary | ICD-10-CM | POA: Diagnosis not present

## 2022-10-31 DIAGNOSIS — L03011 Cellulitis of right finger: Secondary | ICD-10-CM

## 2022-10-31 DIAGNOSIS — Z1231 Encounter for screening mammogram for malignant neoplasm of breast: Secondary | ICD-10-CM

## 2022-10-31 DIAGNOSIS — Z780 Asymptomatic menopausal state: Secondary | ICD-10-CM | POA: Diagnosis not present

## 2022-10-31 DIAGNOSIS — E039 Hypothyroidism, unspecified: Secondary | ICD-10-CM | POA: Diagnosis not present

## 2022-10-31 DIAGNOSIS — E1159 Type 2 diabetes mellitus with other circulatory complications: Secondary | ICD-10-CM | POA: Diagnosis not present

## 2022-10-31 DIAGNOSIS — E119 Type 2 diabetes mellitus without complications: Secondary | ICD-10-CM

## 2022-10-31 DIAGNOSIS — E871 Hypo-osmolality and hyponatremia: Secondary | ICD-10-CM

## 2022-10-31 DIAGNOSIS — E785 Hyperlipidemia, unspecified: Secondary | ICD-10-CM | POA: Diagnosis not present

## 2022-10-31 DIAGNOSIS — N182 Chronic kidney disease, stage 2 (mild): Secondary | ICD-10-CM

## 2022-10-31 DIAGNOSIS — E1169 Type 2 diabetes mellitus with other specified complication: Secondary | ICD-10-CM | POA: Diagnosis not present

## 2022-10-31 LAB — POCT GLYCOSYLATED HEMOGLOBIN (HGB A1C)
Est. average glucose Bld gHb Est-mCnc: 174
Hemoglobin A1C: 7.7 % — AB (ref 4.0–5.6)

## 2022-10-31 MED ORDER — DOXYCYCLINE HYCLATE 100 MG PO TABS: 100.0000 mg | ORAL_TABLET | Freq: Two times a day (BID) | ORAL | 0 refills | Status: AC

## 2022-10-31 NOTE — Assessment & Plan Note (Signed)
Previously well controlled Continue statin Repeat FLP and CMP Goal LDL < 70 

## 2022-10-31 NOTE — Assessment & Plan Note (Signed)
Previously well controlled Continue Synthroid at current dose  Recheck TSH and adjust Synthroid as indicated   

## 2022-10-31 NOTE — Assessment & Plan Note (Signed)
Continue to monitor CBC and B12 every 6 months 

## 2022-10-31 NOTE — Assessment & Plan Note (Signed)
Chronic and uncontrolled with hyperglycemia Associated with/complicated by HTN and HLD Vomiting has improved since decreasing Ozempic dose Up-to-date on screenings Up-to-date on vaccinations No changes to medications - patient declines - will rather improve diet Recheck in 70m

## 2022-10-31 NOTE — Assessment & Plan Note (Signed)
Stable on last visit Repeat today

## 2022-10-31 NOTE — Assessment & Plan Note (Signed)
Discussed importance of healthy weight management Discussed diet and exercise  

## 2022-10-31 NOTE — Assessment & Plan Note (Signed)
Well controlled Continue current medications Recheck metabolic panel F/u in 6 months  

## 2022-10-31 NOTE — Assessment & Plan Note (Signed)
Chronic and stable Recheck metabolic panel Avoid nephrotoxic meds 

## 2022-11-01 ENCOUNTER — Other Ambulatory Visit: Payer: Self-pay | Admitting: Family Medicine

## 2022-11-01 LAB — LIPID PANEL
Chol/HDL Ratio: 2.5 ratio (ref 0.0–4.4)
Cholesterol, Total: 148 mg/dL (ref 100–199)
HDL: 60 mg/dL (ref >39)
LDL Chol Calc (NIH): 72 mg/dL (ref 0–99)
Triglycerides: 86 mg/dL (ref 0–149)
VLDL Cholesterol Cal: 16 mg/dL (ref 5–40)

## 2022-11-01 LAB — CBC WITH DIFFERENTIAL/PLATELET
Basophils Absolute: 0 10*3/uL (ref 0.0–0.2)
Basos: 0 %
EOS (ABSOLUTE): 0.1 10*3/uL (ref 0.0–0.4)
Eos: 1 %
Hematocrit: 32.9 % — ABNORMAL LOW (ref 34.0–46.6)
Hemoglobin: 10.5 g/dL — ABNORMAL LOW (ref 11.1–15.9)
Immature Grans (Abs): 0 10*3/uL (ref 0.0–0.1)
Immature Granulocytes: 0 %
Lymphocytes Absolute: 1.1 10*3/uL (ref 0.7–3.1)
Lymphs: 16 %
MCH: 29.6 pg (ref 26.6–33.0)
MCHC: 31.9 g/dL (ref 31.5–35.7)
MCV: 93 fL (ref 79–97)
Monocytes Absolute: 0.5 10*3/uL (ref 0.1–0.9)
Monocytes: 7 %
Neutrophils Absolute: 5.1 10*3/uL (ref 1.4–7.0)
Neutrophils: 76 %
Platelets: 277 10*3/uL (ref 150–450)
RBC: 3.55 x10E6/uL — ABNORMAL LOW (ref 3.77–5.28)
RDW: 11.9 % (ref 11.7–15.4)
WBC: 6.7 10*3/uL (ref 3.4–10.8)

## 2022-11-01 LAB — COMPREHENSIVE METABOLIC PANEL
ALT: 9 IU/L (ref 0–32)
AST: 12 IU/L (ref 0–40)
Albumin/Globulin Ratio: 1.8 (ref 1.2–2.2)
Albumin: 4.2 g/dL (ref 3.9–4.9)
Alkaline Phosphatase: 163 IU/L — ABNORMAL HIGH (ref 44–121)
BUN/Creatinine Ratio: 21 (ref 12–28)
BUN: 15 mg/dL (ref 8–27)
Bilirubin Total: 0.4 mg/dL (ref 0.0–1.2)
CO2: 24 mmol/L (ref 20–29)
Calcium: 9.8 mg/dL (ref 8.7–10.3)
Chloride: 101 mmol/L (ref 96–106)
Creatinine, Ser: 0.7 mg/dL (ref 0.57–1.00)
Globulin, Total: 2.3 g/dL (ref 1.5–4.5)
Glucose: 172 mg/dL — ABNORMAL HIGH (ref 70–99)
Potassium: 5.2 mmol/L (ref 3.5–5.2)
Sodium: 137 mmol/L (ref 134–144)
Total Protein: 6.5 g/dL (ref 6.0–8.5)
eGFR: 96 mL/min/{1.73_m2} (ref >59)

## 2022-11-01 LAB — MICROALBUMIN / CREATININE URINE RATIO
Creatinine, Urine: 43.9 mg/dL
Microalb/Creat Ratio: 87 mg/g creat — ABNORMAL HIGH (ref 0–29)
Microalbumin, Urine: 38.2 ug/mL

## 2022-11-01 LAB — VITAMIN B12: Vitamin B-12: 301 pg/mL (ref 232–1245)

## 2022-11-01 LAB — TSH: TSH: 0.901 u[IU]/mL (ref 0.450–4.500)

## 2022-11-01 NOTE — Telephone Encounter (Signed)
Medication Refill - Medication: doxycycline (VIBRA-TABS) 100 MG tablet [[id number]]   and Cholecalciferol (VITAMIN D3) 125 MCG (5000 UT) CAPS [[id number]]   Has the patient contacted their pharmacy? Yes.   ( Preferred Pharmacy (with phone number or street name):  [id number] [place], [place] - [address] AT [facility] [address] [place], [place] [address] Phone: [phone]  Fax: [phone] DEA #: [id number]  DAW Reason: --       Has the patient been seen for an appointment in the last year OR does the patient have an upcoming appointment? Yes.    Agent: Please be advised that RX refills may take up to 3 business days. We ask that you follow-up with your pharmacy.

## 2022-11-01 NOTE — Telephone Encounter (Signed)
Requested medication (s) are due for refill today:   Yes for both  Requested medication (s) are on the active medication list:   Yes for both  Future visit scheduled:   Yes   Last ordered: Doxycycline 10/31/2022 #14, 0 refills, Duplicate;   Vitamin D3 10/24/2022 #90, 3 refills, Duplicate  Returned because both of these are non delegated refills.   Both are duplicate orders.   Requested Prescriptions  Pending Prescriptions Disp Refills   doxycycline (VIBRA-TABS) 100 MG tablet 14 tablet 0    Sig: Take 1 tablet (100 mg total) by mouth 2 (two) times daily for 7 days.     Off-Protocol Failed - 11/01/2022 10:02 AM      Failed - Medication not assigned to a protocol, review manually.      Passed - Valid encounter within last 12 months    Recent Outpatient Visits           Yesterday Type 2 diabetes mellitus without complication, without long-term current use of insulin (HCC)   [place] [facility] [place], Marzella Schlein, MD   6 months ago Type 2 diabetes mellitus with other specified complication, without long-term current use of insulin [facility])   [place] [facility] [place], Marzella Schlein, MD   11 months ago Abscess   [place] [facility] [place], Marzella Schlein, MD   1 year ago Type 2 diabetes mellitus with other specified complication, without long-term current use of insulin [facility])   [place] [facility] [place], Marzella Schlein, MD   1 year ago Hypertension associated with diabetes [facility])   [place] [facility] [place], Marzella Schlein, MD       Future Appointments             In 3 months Bacigalupo, Marzella Schlein, MD [facility], [facility]             Cholecalciferol (VITAMIN D3) 125 MCG (5000 UT) CAPS 90 capsule 3    Sig: Take 1 capsule (5,000 Units total) by mouth daily. TAKE 1 CAPSULE EVERY DAY     Endocrinology:  Vitamins - Vitamin D Supplementation 2 Failed - 11/01/2022 10:02 AM       Failed - Manual Review: Route requests for 50,000 IU strength to the provider      Failed - Vitamin D in normal range and within 360 days    Vit D, 25-Hydroxy  Date Value Ref Range Status  04/05/2021 67.9 30.0 - 100.0 ng/mL Final    Comment:    Vitamin D deficiency has been defined by the Institute of Medicine and an Endocrine Society practice guideline as a level of serum 25-OH vitamin D less than 20 ng/mL (1,2). The Endocrine Society went on to further define vitamin D insufficiency as a level between 21 and 29 ng/mL (2). 1. IOM (Institute of Medicine). 2010. Dietary reference    intakes for calcium and D. [place]: The    [employer]. 2. Holick MF, Binkley [place], Bischoff-Ferrari HA, et al.    Evaluation, treatment, and prevention of vitamin D    deficiency: an Endocrine Society clinical practice    guideline. JCEM. 2011 Jul; 96(7):1911-30.          Passed - Ca in normal range and within 360 days    Calcium  Date Value Ref Range Status  10/31/2022 9.8 8.7 - 10.3 mg/dL Final         Passed - Valid encounter  within last 12 months    Recent Outpatient Visits           Yesterday Type 2 diabetes mellitus without complication, without long-term current use of insulin [facility])   [place] [facility] [place], Marzella Schlein, MD   6 months ago Type 2 diabetes mellitus with other specified complication, without long-term current use of insulin [facility])   [place] [facility] [place], Marzella Schlein, MD   11 months ago Abscess   [place] [facility] [place], Marzella Schlein, MD   1 year ago Type 2 diabetes mellitus with other specified complication, without long-term current use of insulin [facility])    [facility] [place], Marzella Schlein, MD   1 year ago Hypertension associated with diabetes [facility])    [facility] [place], Marzella Schlein, MD       Future Appointments             In  3 months Bacigalupo, Marzella Schlein, MD [facility], [facility]

## 2022-11-03 ENCOUNTER — Telehealth: Payer: Self-pay

## 2022-11-03 MED ORDER — EMPAGLIFLOZIN 10 MG PO TABS: 10.0000 mg | ORAL_TABLET | Freq: Every day | ORAL | 1 refills | Status: DC

## 2022-11-03 NOTE — Telephone Encounter (Signed)
-----   Message from Erasmo Downer, MD sent at 11/02/2022  4:49 PM EDT ----- Normal/stable labs, except increasing protein in urine.  To further protect the kidneys, recommend adding Jardiance 10 mg daily. Ok to eRx if patient agrees.

## 2022-11-06 ENCOUNTER — Ambulatory Visit: Payer: Medicare Other | Admitting: Family Medicine

## 2022-11-14 ENCOUNTER — Other Ambulatory Visit: Payer: Self-pay | Admitting: Family Medicine

## 2022-11-14 NOTE — Telephone Encounter (Signed)
Medication Refill - Medication: budesonide-formoterol (SYMBICORT) 160-4.5 MCG/ACT inhaler , SPIRIVA HANDIHALER 18 MCG inhalation capsule ,  Has the patient contacted their pharmacy? No. (Agent: If no, request that the patient contact the pharmacy for the refill. If patient does not wish to contact the pharmacy document the reason why and proceed with request.)   Preferred Pharmacy (with phone number or street name):  [facility] - [place], [place] - [address]  [address] [place] [address]  Phone: [phone] Fax: [phone]  Hours: Not open 24 hours   Has the patient been seen for an appointment in the last year OR does the patient have an upcoming appointment? Yes.    Agent: Please be advised that RX refills may take up to 3 business days. We ask that you follow-up with your pharmacy.

## 2022-11-15 ENCOUNTER — Telehealth: Payer: Self-pay

## 2022-11-15 NOTE — Telephone Encounter (Signed)
Requested Prescriptions  Refused Prescriptions Disp Refills   budesonide-formoterol (SYMBICORT) 160-4.5 MCG/ACT inhaler 3 each 3    Sig: Inhale 2 puffs into the lungs 2 (two) times daily.     Pulmonology:  Combination Products Passed - 11/14/2022  2:24 PM      Passed - Valid encounter within last 12 months    Recent Outpatient Visits           2 weeks ago Type 2 diabetes mellitus without complication, without long-term current use of insulin (HCC)   [place] [facility] [place], Marzella Schlein, MD   6 months ago Type 2 diabetes mellitus with other specified complication, without long-term current use of insulin [facility])   [place] [facility] [place], Marzella Schlein, MD   11 months ago Abscess   [place] [facility] [place], Marzella Schlein, MD   1 year ago Type 2 diabetes mellitus with other specified complication, without long-term current use of insulin [facility])   [place] [facility] [place], Marzella Schlein, MD   1 year ago Hypertension associated with diabetes [facility])   [place] [facility] [place], Marzella Schlein, MD       Future Appointments             In 2 months Bacigalupo, Marzella Schlein, MD [place] [facility], [facility]             SPIRIVA HANDIHALER 18 MCG inhalation capsule 90 capsule 1    Sig: INHALE THE CONTENTS OF 1 CAPSULE EVERY DAY AS DIRECTED     Pulmonology:  Anticholinergic Agents Passed - 11/14/2022  2:24 PM      Passed - Valid encounter within last 12 months    Recent Outpatient Visits           2 weeks ago Type 2 diabetes mellitus without complication, without long-term current use of insulin (HCC)   [place] [facility] [place], Marzella Schlein, MD   6 months ago Type 2 diabetes mellitus with other specified complication, without long-term current use of insulin [facility])   [place] [facility] [place], Marzella Schlein, MD   11 months ago Abscess    [place] [facility] [place], Marzella Schlein, MD   1 year ago Type 2 diabetes mellitus with other specified complication, without long-term current use of insulin [facility])   [place] [facility] [place], Marzella Schlein, MD   1 year ago Hypertension associated with diabetes [facility])   [place] [facility] [place], Marzella Schlein, MD       Future Appointments             In 2 months Bacigalupo, Marzella Schlein, MD [facility], [facility]

## 2022-11-15 NOTE — Telephone Encounter (Signed)
Referral coordinator has scheduled appt. And patient has been notified.

## 2022-11-15 NOTE — Telephone Encounter (Signed)
Copied from CRM [id number]. Topic: General - Other >> Nov 15, 2022  3:01 PM Santiya F wrote: Reason for CRM: Pt is calling in returning a call from the office regarding a bone density test. Please advise.

## 2022-11-24 ENCOUNTER — Other Ambulatory Visit: Payer: Self-pay | Admitting: Family Medicine

## 2022-11-24 NOTE — Telephone Encounter (Signed)
Unable to refill per protocol, Rx request is too soon, duplicate request.  Requested Prescriptions  Pending Prescriptions Disp Refills   SPIRIVA HANDIHALER 18 MCG inhalation capsule 90 capsule 1    Sig: INHALE THE CONTENTS OF 1 CAPSULE EVERY DAY AS DIRECTED     Pulmonology:  Anticholinergic Agents Passed - 11/24/2022 12:28 PM      Passed - Valid encounter within last 12 months    Recent Outpatient Visits           3 weeks ago Type 2 diabetes mellitus without complication, without long-term current use of insulin (HCC)   [place] [facility] [place], Marzella Schlein, MD   7 months ago Type 2 diabetes mellitus with other specified complication, without long-term current use of insulin [facility])   [place] [facility] [place], Marzella Schlein, MD   1 year ago Abscess   [place] [facility] [place], Marzella Schlein, MD   1 year ago Type 2 diabetes mellitus with other specified complication, without long-term current use of insulin [facility])   [place] [facility] [place], Marzella Schlein, MD   1 year ago Hypertension associated with diabetes [facility])   [place] [facility] Bacigal[place], Marzella Schlein, MD       Future Appointments             In 2 months Bacigalupo, Marzella Schlein, MD [facility], [facility]

## 2022-11-24 NOTE — Telephone Encounter (Signed)
Medication Refill - Medication: SPIRIVA HANDIHALER 18 MCG inhalation capsule ,   Has the patient contacted their pharmacy? No.   Preferred Pharmacy (with phone number or street name):  [id number] - [place], [place] - [address] AT [facility] [address] Phone: [phone]  Fax: [phone]     Has the patient been seen for an appointment in the last year OR does the patient have an upcoming appointment? Yes.    Please assistant patient further

## 2022-11-27 ENCOUNTER — Other Ambulatory Visit: Payer: Self-pay | Admitting: Family Medicine

## 2022-11-27 NOTE — Telephone Encounter (Signed)
Medication Refill - Medication: SPIRIVA HANDIHALER 18 MCG inhalation capsule   Pt is requesting the 1 refill left with [facility] be sent to pharmacy below as [facility] will not pay for medication.  Has the patient contacted their pharmacy? Yes.   No, more refills.   (Agent: If yes, when and what did the pharmacy advise?)  Preferred Pharmacy (with phone number or street name):  [id number] - [place], [place] - [address] AT [facility] [address] [facility] [address]  [address] [place] [address]  Phone: [phone] Fax: [phone]  Hours: Not open 24 hours   Has the patient been seen for an appointment in the last year OR does the patient have an upcoming appointment? Yes.    Agent: Please be advised that RX refills may take up to 3 business days. We ask that you follow-up with your pharmacy.

## 2022-11-28 MED ORDER — SPIRIVA HANDIHALER 18 MCG IN CAPS: ORAL_CAPSULE | RESPIRATORY_TRACT | 0 refills | Status: DC

## 2022-11-28 NOTE — Telephone Encounter (Signed)
Requested Prescriptions  Pending Prescriptions Disp Refills   SPIRIVA HANDIHALER 18 MCG inhalation capsule 90 capsule 0    Sig: INHALE THE CONTENTS OF 1 CAPSULE EVERY DAY AS DIRECTED     Pulmonology:  Anticholinergic Agents Passed - 11/27/2022 11:27 AM      Passed - Valid encounter within last 12 months    Recent Outpatient Visits           4 weeks ago Type 2 diabetes mellitus without complication, without long-term current use of insulin (HCC)   [place] [facility] [place], Marzella Schlein, MD   7 months ago Type 2 diabetes mellitus with other specified complication, without long-term current use of insulin [facility])   [place] [facility], Marzella Schlein, MD   1 year ago Abscess   [place] [facility] [place], Marzella Schlein, MD   1 year ago Type 2 diabetes mellitus with other specified complication, without long-term current use of insulin [facility])   [place] [facility] [place], Marzella Schlein, MD   1 year ago Hypertension associated with diabetes [facility])   [place] [facility] Bacigalupo, Marzella Schlein, MD       Future Appointments             In 2 months Bacigalupo, Marzella Schlein, MD [facility], [facility]

## 2022-11-30 ENCOUNTER — Other Ambulatory Visit: Payer: Self-pay | Admitting: Family Medicine

## 2022-11-30 ENCOUNTER — Other Ambulatory Visit: Payer: Self-pay

## 2022-11-30 DIAGNOSIS — J449 Chronic obstructive pulmonary disease, unspecified: Secondary | ICD-10-CM

## 2022-11-30 NOTE — Telephone Encounter (Signed)
June pt sister stated Insurance will not pay for SPIRIVA HANDIHALER 18 MCG inhalation capsule Stated the pharmacy advised her a list of alternatives was sent from pharmacy yesterday, and she is calling to f/u.  Mentioned Don[facility] Clay was an option for an alternative.   June is requesting a callback. Please advise.

## 2022-11-30 NOTE — Telephone Encounter (Signed)
[facility] faxed refill request for the following medications:    SPIRIVA HANDIHALER 18 MCG inhalation capsule    Please advise

## 2022-11-30 NOTE — Telephone Encounter (Signed)
[facility] listed preferred alternative [facility]FA, Breo Ellipta. Please advise if medication can be changed to one of these. Or if PA is needed. sd

## 2022-12-01 MED ORDER — FLUTICASONE FUROATE-VILANTEROL 100-25 MCG/ACT IN AEPB: 1.0000 | INHALATION_SPRAY | Freq: Every day | RESPIRATORY_TRACT | 11 refills | Status: DC

## 2022-12-01 NOTE — Telephone Encounter (Signed)
Rx changed to breo for accepted alternative prescription

## 2022-12-05 ENCOUNTER — Other Ambulatory Visit: Payer: Self-pay | Admitting: Family Medicine

## 2022-12-11 ENCOUNTER — Ambulatory Visit (INDEPENDENT_AMBULATORY_CARE_PROVIDER_SITE_OTHER): Payer: Medicare Other | Admitting: Podiatry

## 2022-12-11 ENCOUNTER — Encounter: Payer: Self-pay | Admitting: Podiatry

## 2022-12-11 VITALS — BP 139/72 | HR 62

## 2022-12-11 DIAGNOSIS — M79676 Pain in unspecified toe(s): Secondary | ICD-10-CM

## 2022-12-11 DIAGNOSIS — B351 Tinea unguium: Secondary | ICD-10-CM

## 2022-12-14 ENCOUNTER — Ambulatory Visit: Payer: Medicare Other | Admitting: Podiatry

## 2022-12-14 NOTE — Progress Notes (Signed)
  Subjective:  Patient ID: Emma Fletcher, female    DOB: [birth year],  MRN: [id number]  Emma Fletcher presents to clinic today for: preventative diabetic foot care and painful elongated mycotic toenails 1-5 bilaterally which are tender when wearing enclosed shoe gear. Pain is relieved with periodic professional debridement.  Chief Complaint  Patient presents with   Diabetes    "Do my toenail.  My big toenails are sore."  Dr. Virgil Benedict - 10/31/2022 , glucose - 140 mg/dl    PCP is Bacigalupo, Marzella Schlein, MD.  No Known Allergies  Review of Systems: Negative except as noted in the HPI.  Objective: No changes noted in today's physical examination. Vitals:   12/11/22 1448  BP: 139/72  Pulse: 62    Emma Fletcher is a pleasant 67 y.o. female in NAD. AAO x 3.  Vascular Examination: Capillary refill time <3 seconds b/l LE. Palpable pedal pulses b/l LE. Digital hair present b/l. No pedal edema b/l. Skin temperature gradient WNL b/l. No varicosities b/l. Marland Kitchen  Dermatological Examination: Pedal skin with normal turgor, texture and tone b/l. No open wounds. No interdigital macerations b/l. Toenails 1-5 b/l thickened, discolored, dystrophic with subungual debris. There is pain on palpation to dorsal aspect of nailplates. No hyperkeratotic nor porokeratotic lesions present on today's visit.Marland Kitchen  Neurological Examination: Protective sensation intact with 10 gram monofilament b/l LE.   Musculoskeletal Examination: Normal muscle strength 5/5 to all lower extremity muscle groups bilaterally. No pain, crepitus or joint limitation noted with ROM b/l LE. No gross bony pedal deformities b/l. Patient ambulates independently without assistive aids.     Latest Ref Rng & Units 10/31/2022    8:19 AM 04/24/2022    8:24 AM  Hemoglobin A1C  Hemoglobin-A1c 4.0 - 5.6 % 7.7  7.0    Assessment/Plan: 1. Pain due to onychomycosis of toenail     Patient was evaluated and treated. All patient's and/or POA's  questions/concerns addressed on today's visit. Toenails 1-5 debrided in length and girth without incident. Continue soft, supportive shoe gear daily. Report any pedal injuries to medical professional. Call office if there are any questions/concerns. -Patient/POA to call should there be question/concern in the interim.   Return in about 3 months (around 03/13/2023).  Freddie Breech, DPM

## 2022-12-22 ENCOUNTER — Ambulatory Visit
Admission: RE | Admit: 2022-12-22 | Discharge: 2022-12-22 | Disposition: A | Discharge status: Home / Self Care | Payer: Medicare Other | Source: Ambulatory Visit | Attending: Family Medicine | Admitting: Family Medicine

## 2022-12-22 DIAGNOSIS — Z1231 Encounter for screening mammogram for malignant neoplasm of breast: Secondary | ICD-10-CM | POA: Diagnosis present

## 2023-01-08 ENCOUNTER — Ambulatory Visit
Admission: RE | Admit: 2023-01-08 | Discharge: 2023-01-08 | Disposition: A | Discharge status: Home / Self Care | Payer: Medicare Other | Source: Ambulatory Visit | Attending: Family Medicine | Admitting: Family Medicine

## 2023-01-08 DIAGNOSIS — Z780 Asymptomatic menopausal state: Secondary | ICD-10-CM | POA: Diagnosis present

## 2023-01-08 DIAGNOSIS — M85852 Other specified disorders of bone density and structure, left thigh: Secondary | ICD-10-CM | POA: Diagnosis not present

## 2023-01-19 ENCOUNTER — Ambulatory Visit: Payer: Self-pay

## 2023-01-19 NOTE — Telephone Encounter (Addendum)
Pt. Calling in, unable to understand pt.'s speech. Told pt. I could not understand her and she disconnected the line. Called pt.'s sister, June Watson, and left a message to call back.

## 2023-01-29 ENCOUNTER — Other Ambulatory Visit: Payer: Self-pay | Admitting: Family Medicine

## 2023-02-05 ENCOUNTER — Encounter: Payer: Self-pay | Admitting: Family Medicine

## 2023-02-05 ENCOUNTER — Ambulatory Visit (INDEPENDENT_AMBULATORY_CARE_PROVIDER_SITE_OTHER): Payer: Medicare Other | Admitting: Family Medicine

## 2023-02-05 VITALS — BP 133/70 | HR 66 | Temp 98.2°F | Resp 20 | Ht 62.0 in | Wt 183.0 lb

## 2023-02-05 DIAGNOSIS — E1159 Type 2 diabetes mellitus with other circulatory complications: Secondary | ICD-10-CM | POA: Diagnosis not present

## 2023-02-05 DIAGNOSIS — I152 Hypertension secondary to endocrine disorders: Secondary | ICD-10-CM | POA: Diagnosis not present

## 2023-02-05 DIAGNOSIS — D638 Anemia in other chronic diseases classified elsewhere: Secondary | ICD-10-CM | POA: Diagnosis not present

## 2023-02-05 DIAGNOSIS — E1169 Type 2 diabetes mellitus with other specified complication: Secondary | ICD-10-CM | POA: Diagnosis not present

## 2023-02-05 DIAGNOSIS — Z23 Encounter for immunization: Secondary | ICD-10-CM | POA: Diagnosis not present

## 2023-02-05 DIAGNOSIS — E1129 Type 2 diabetes mellitus with other diabetic kidney complication: Secondary | ICD-10-CM

## 2023-02-05 DIAGNOSIS — R809 Proteinuria, unspecified: Secondary | ICD-10-CM | POA: Insufficient documentation

## 2023-02-05 DIAGNOSIS — E785 Hyperlipidemia, unspecified: Secondary | ICD-10-CM | POA: Diagnosis not present

## 2023-02-05 LAB — POCT GLYCOSYLATED HEMOGLOBIN (HGB A1C): Hemoglobin A1C: 7.2 % — AB (ref 4.0–5.6)

## 2023-02-05 NOTE — Progress Notes (Signed)
Established Patient Office Visit  Subjective   Patient ID: Emma Fletcher, female    DOB: 1956/04/03  Age: 67 y.o. MRN: [id number]  Chief Complaint  Patient presents with   Medical Management of Chronic Issues    Patient reports good compliance and tolerance to medications.     Emma Fletcher is here today for medical management of her chronic conditions.  She has no questions or concerns to discuss today.  She reports good adherence to her medications. Overall she feels her health is going well and is happy her BP and A1c is lower than last visit.  She is accompanied by her sister today.    Patient Active Problem List   Diagnosis Date Noted   Microalbuminuria 02/05/2023   Microalbuminuria due to type 2 diabetes mellitus (HCC) 02/05/2023   Peroneal tendinitis, right 07/18/2021   Obesity 07/11/2021   Postprandial abdominal pain in right upper quadrant 07/11/2021   Peroneal tendinitis, right leg 04/25/2021   Callus 07/26/2020   Allergic rhinitis 05/20/2020   Trochanteric bursitis of right hip 02/26/2020   Bone pain 09/15/2019   Chronic obstructive pulmonary disease (HCC) 06/23/2019   Hyperlipidemia associated with type 2 diabetes mellitus (HCC) 09/20/2017   Hypothyroidism 09/20/2017   CKD (chronic kidney disease), stage II 05/16/2016   T2DM (type 2 diabetes mellitus) (HCC) 12/18/2015   Hyponatremia 12/18/2015   Anemia of chronic disease 12/18/2015   Hypertension associated with diabetes (HCC) 11/10/2008   Secondary cardiomyopathy (HCC) 11/10/2008      Review of Systems  All other systems reviewed and are negative.     Objective:     BP 133/70 (BP Location: Left Arm, Patient Position: Sitting, Cuff Size: Large)   Pulse 66   Temp 98.2 F (36.8 C) (Temporal)   Resp 20   Ht 5\' 2"  (1.575 m)   Wt 183 lb (83 kg)   LMP  (LMP Unknown)   SpO2 99%   BMI 33.47 kg/m  BP Readings from Last 3 Encounters:  02/05/23 133/70  12/11/22 139/72  10/31/22 136/77   Wt Readings  from Last 3 Encounters:  02/05/23 183 lb (83 kg)  10/31/22 193 lb 4.8 oz (87.7 kg)  06/07/22 196 lb (88.9 kg)      Physical Exam Constitutional:      Appearance: Normal appearance.  Cardiovascular:     Rate and Rhythm: Normal rate and regular rhythm.     Heart sounds: Normal heart sounds.  Pulmonary:     Effort: Pulmonary effort is normal.     Breath sounds: Normal breath sounds.  Neurological:     Mental Status: She is alert. Mental status is at baseline.      Results for orders placed or performed in visit on 02/05/23  POCT glycosylated hemoglobin (Hb A1C)  Result Value Ref Range   Hemoglobin A1C 7.2 (A) 4.0 - 5.6 %    Last CBC Lab Results  Component Value Date   WBC 6.7 10/31/2022   HGB 10.5 (L) 10/31/2022   HCT 32.9 (L) 10/31/2022   MCV 93 10/31/2022   MCH 29.6 10/31/2022   RDW 11.9 10/31/2022   PLT 277 10/31/2022   Last metabolic panel Lab Results  Component Value Date   GLUCOSE 172 (H) 10/31/2022   NA 137 10/31/2022   K 5.2 10/31/2022   CL 101 10/31/2022   CO2 24 10/31/2022   BUN 15 10/31/2022   CREATININE 0.70 10/31/2022   EGFR 96 10/31/2022   CALCIUM 9.8 10/31/2022   PROT  6.5 10/31/2022   ALBUMIN 4.2 10/31/2022   LABGLOB 2.3 10/31/2022   AGRATIO 1.8 10/31/2022   BILITOT 0.4 10/31/2022   ALKPHOS 163 (H) 10/31/2022   AST 12 10/31/2022   ALT 9 10/31/2022   ANIONGAP 12 02/13/2019   Last lipids Lab Results  Component Value Date   CHOL 148 10/31/2022   HDL 60 10/31/2022   LDLCALC 72 10/31/2022   TRIG 86 10/31/2022   CHOLHDL 2.5 10/31/2022   Last hemoglobin A1c Lab Results  Component Value Date   HGBA1C 7.2 (A) 02/05/2023   Last thyroid functions Lab Results  Component Value Date   TSH 0.901 10/31/2022      The 10-year ASCVD risk score (Arnett DK, et al., 2019) is: 14%    Assessment & Plan:   Problem List Items Addressed This Visit       Cardiovascular and Mediastinum   Hypertension associated with diabetes (HCC) - Primary     Well controlled  Continue current meds  F/u in 3 months         Endocrine   T2DM (type 2 diabetes mellitus) (HCC)    Chronic and relatively uncontrolled, improving  Hbg A1c improved 0.5 from last visit 3 months ago  Continue Ozempic  Continue Jardiance 10 mg, but consider increasing to 25mg  daily at next visit if A1c not to goal Patient focused on diet and lifestyle modifications for blood sugar control        Relevant Orders   POCT glycosylated hemoglobin (Hb A1C) (Completed)   Hyperlipidemia associated with type 2 diabetes mellitus (HCC)    Well controlled  Latest lipid panel wnl  Continue Statin       Microalbuminuria due to type 2 diabetes mellitus (HCC)    New  Microalbumin / Cr ratio elevated to 87  Added Jiardance 3 months ago Continue current meds  Recheck UACR in 3 months         Other   Anemia of chronic disease    Stable Last labs show nml B12  Hbg is within nml range for this patient  CTM CBC and B12 every 6 months       Other Visit Diagnoses     Need for influenza vaccination       Relevant Orders   Flu Vaccine QUAD High Dose(Fluad) (Completed)       Return in about 3 months (around 05/08/2023) for chronic disease f/u.    Rometta Emery, Medical Student   Patient seen along with MS3 student Jodi Marble. I personally evaluated this patient along with the student, and verified all aspects of the history, physical exam, and medical decision making as documented by the student. I agree with the student's documentation and have made all necessary edits.  Nastacia Raybuck, Marzella Schlein, MD, MPH [facility]

## 2023-02-05 NOTE — Assessment & Plan Note (Signed)
Stable Last labs show nml B12  Hbg is within nml range for this patient  CTM CBC and B12 every 6 months

## 2023-02-05 NOTE — Assessment & Plan Note (Addendum)
Well controlled  Continue current meds  F/u in 3 months

## 2023-02-05 NOTE — Assessment & Plan Note (Signed)
Well controlled  Latest lipid panel wnl  Continue Statin

## 2023-02-05 NOTE — Assessment & Plan Note (Addendum)
New  Microalbumin / Cr ratio elevated to 87  Added Jiardance 3 months ago Continue current meds  Recheck UACR in 3 months

## 2023-02-05 NOTE — Assessment & Plan Note (Addendum)
Chronic and relatively uncontrolled, improving  Hbg A1c improved 0.5 from last visit 3 months ago  Continue Ozempic  Continue Jardiance 10 mg, but consider increasing to 25mg  daily at next visit if A1c not to goal Patient focused on diet and lifestyle modifications for blood sugar control

## 2023-02-05 NOTE — Assessment & Plan Note (Addendum)
Well controlled 

## 2023-03-26 ENCOUNTER — Ambulatory Visit: Payer: Medicare Other | Admitting: Podiatry

## 2023-03-26 ENCOUNTER — Encounter: Payer: Self-pay | Admitting: Podiatry

## 2023-03-26 DIAGNOSIS — N182 Chronic kidney disease, stage 2 (mild): Secondary | ICD-10-CM

## 2023-03-26 DIAGNOSIS — M79676 Pain in unspecified toe(s): Secondary | ICD-10-CM

## 2023-03-26 DIAGNOSIS — B351 Tinea unguium: Secondary | ICD-10-CM | POA: Diagnosis not present

## 2023-03-26 DIAGNOSIS — E1122 Type 2 diabetes mellitus with diabetic chronic kidney disease: Secondary | ICD-10-CM | POA: Diagnosis not present

## 2023-03-26 NOTE — Progress Notes (Signed)
Subjective:  Patient ID: Emma Fletcher, female    DOB: 1956/04/18,  MRN: [id number]  67 y.o. female presents preventative diabetic foot care and painful thick toenails that are difficult to trim. Pain interferes with ambulation. Aggravating factors include wearing enclosed shoe gear. Pain is relieved with periodic professional debridement.  New problem(s): None   PCP is Erasmo Downer, MD , and last visit was February 05, 2023.  No Known Allergies  Review of Systems: Negative except as noted in the HPI.   Objective:  Emma Fletcher is a pleasant 67 y.o. female WD, WN in NAD.Marland Kitchen AAO x 3.  Vascular Examination: Vascular status intact b/l with palpable pedal pulses. CFT immediate b/l. Pedal hair present. No edema. No pain with calf compression b/l. Skin temperature gradient WNL b/l. No varicosities noted. No cyanosis or clubbing noted.  Neurological Examination: Sensation grossly intact b/l with 10 gram monofilament. Vibratory sensation intact b/l.  Dermatological Examination: Pedal skin with normal turgor, texture and tone b/l. No open wounds nor interdigital macerations noted. Toenails 1-5 b/l thick, discolored, elongated with subungual debris and pain on dorsal palpation. No hyperkeratotic lesions noted b/l.   Musculoskeletal Examination: Muscle strength 5/5 to b/l LE.  No pain, crepitus noted b/l. No gross pedal deformities. Patient ambulates independently without assistive aids.   Radiographs: None  Last A1c:      Latest Ref Rng & Units 02/05/2023    8:19 AM 10/31/2022    8:19 AM 04/24/2022    8:24 AM  Hemoglobin A1C  Hemoglobin-A1c 4.0 - 5.6 % 7.2  7.7  7.0      Assessment:   1. Pain due to onychomycosis of toenail   2. Type 2 diabetes mellitus with stage 2 chronic kidney disease, without long-term current use of insulin (HCC)    Plan:  -Consent given for treatment as described below: -Examined patient. -Continue supportive shoe gear daily. -Mycotic toenails  1-5 bilaterally were debrided in length and girth with sterile nail nippers and dremel. Pinpoint bleeding of right great toe addressed with Lumicain Hemostatic Solution, cleansed with alcohol. Triple antibiotic ointment applied. Patient/caregiver instructed to apply Neosporin Cream once daily for 7 days. -Patient/POA to call should there be question/concern in the interim.  Return in about 3 months (around 06/26/2023).  Freddie Breech, DPM

## 2023-04-24 DIAGNOSIS — E119 Type 2 diabetes mellitus without complications: Secondary | ICD-10-CM | POA: Diagnosis not present

## 2023-04-24 DIAGNOSIS — H2513 Age-related nuclear cataract, bilateral: Secondary | ICD-10-CM | POA: Diagnosis not present

## 2023-04-24 LAB — HM DIABETES EYE EXAM

## 2023-04-26 ENCOUNTER — Other Ambulatory Visit: Payer: Self-pay | Admitting: Family Medicine

## 2023-04-26 MED ORDER — FLUTICASONE FUROATE-VILANTEROL 100-25 MCG/ACT IN AEPB: 1.0000 | INHALATION_SPRAY | Freq: Every day | RESPIRATORY_TRACT | 2 refills | Status: DC

## 2023-04-26 NOTE — Telephone Encounter (Signed)
Requested Prescriptions  Pending Prescriptions Disp Refills   fluticasone furoate-vilanterol (BREO ELLIPTA) 100-25 MCG/ACT AEPB 1 each 11    Sig: Inhale 1 puff into the lungs daily.     Pulmonology:  Combination Products Passed - 04/26/2023  9:43 AM      Passed - Valid encounter within last 12 months    Recent Outpatient Visits           2 months ago Hypertension associated with diabetes [facility])   [place] [place], Marzella Schlein, MD   5 months ago Type 2 diabetes mellitus without complication, without long-term current use of insulin [facility])   [place] [place], Marzella Schlein, MD   1 year ago Type 2 diabetes mellitus with other specified complication, without long-term current use of insulin [facility])   [place] [facility] [place], Marzella Schlein, MD   1 year ago Abscess   [place] [facility] [place], Marzella Schlein, MD   1 year ago Type 2 diabetes mellitus with other specified complication, without long-term current use of insulin [facility])    [facility] Bacigal[place], Marzella Schlein, MD       Future Appointments             In 1 week Bacigalupo, Marzella Schlein, MD [facility], [facility]

## 2023-04-26 NOTE — Telephone Encounter (Signed)
Medication Refill -  Most Recent Primary Care Visit:  Provider: Erasmo Downer  Department: [facility]  Visit Type: OFFICE VISIT  Date: 02/05/2023  Medication: fluticasone furoate-vilanterol (BREO ELLIPTA) 100-25 MCG/ACT AEPB [160  Has the patient contacted their pharmacy? Yes  Is this the correct pharmacy for this prescription? Yes   This is the patient's preferred pharmacy:   [id number] - [place], [place] - [address] AT [facility] [address]  [address] [place], [place] [address]  Phone:  [phone]  Fax:  [phone]  DEA #:  [id number] DAW Reason: --     Has the prescription been filled recently? Yes  Is the patient out of the medication? Yes  Has the patient been seen for an appointment in the last year OR does the patient have an upcoming appointment? Yes  Can we respond through MyChart? Yes  Agent: Please be advised that Rx refills may take up to 3 business days. We ask that you follow-up with your pharmacy.

## 2023-04-27 NOTE — Telephone Encounter (Signed)
Requested Prescriptions  Pending Prescriptions Disp Refills   empagliflozin (JARDIANCE) 10 MG TABS tablet [Pharmacy Med Name: JARDIANCE 10MG  TABLETS] 90 tablet 0    Sig: TAKE 1 TABLET BY MOUTH DAILY BEFORE BREAKFAST     Endocrinology:  Diabetes - SGLT2 Inhibitors Passed - 04/26/2023 10:13 PM      Passed - Cr in normal range and within 360 days    Creatinine, Ser  Date Value Ref Range Status  10/31/2022 0.70 0.57 - 1.00 mg/dL Final         Passed - HBA1C is between 0 and 7.9 and within 180 days    Hemoglobin A1C  Date Value Ref Range Status  02/05/2023 7.2 (A) 4.0 - 5.6 % Final   Hgb A1c MFr Bld  Date Value Ref Range Status  10/17/2021 7.1 (H) 4.8 - 5.6 % Final    Comment:             Prediabetes: 5.7 - 6.4          Diabetes: >6.4          Glycemic control for adults with diabetes: <7.0          Passed - eGFR in normal range and within 360 days    GFR calc Af Amer  Date Value Ref Range Status  04/12/2020 103 >59 mL/min/1.73 Final    Comment:    **In accordance with recommendations from the NKF-ASN Task force,**   [facility] is in the process of updating its eGFR calculation to the   2021 CKD-EPI creatinine equation that estimates kidney function   without a race variable.    GFR calc non Af Amer  Date Value Ref Range Status  04/12/2020 89 >59 mL/min/1.73 Final   eGFR  Date Value Ref Range Status  10/31/2022 96 >59 mL/min/1.73 Final         Passed - Valid encounter within last 6 months    Recent Outpatient Visits           2 months ago Hypertension associated with diabetes [facility])   [place] [facility] [place], Marzella Schlein, MD   5 months ago Type 2 diabetes mellitus without complication, without long-term current use of insulin [facility])   [place] [place], Marzella Schlein, MD   1 year ago Type 2 diabetes mellitus with other specified complication, without long-term current use of insulin [facility])   [facility] [place], Marzella Schlein, MD   1 year ago Abscess   [place] [facility] [place], Marzella Schlein, MD   1 year ago Type 2 diabetes mellitus with other specified complication, without long-term current use of insulin [facility])   [place] [facility] Bacigalupo, Marzella Schlein, MD       Future Appointments             In 1 week Bacigalupo, Marzella Schlein, MD [facility], [facility]

## 2023-05-07 ENCOUNTER — Encounter: Payer: Self-pay | Admitting: Family Medicine

## 2023-05-07 ENCOUNTER — Ambulatory Visit (INDEPENDENT_AMBULATORY_CARE_PROVIDER_SITE_OTHER): Payer: Medicare Other | Admitting: Family Medicine

## 2023-05-07 VITALS — BP 138/82 | HR 72 | Ht 66.0 in | Wt 183.0 lb

## 2023-05-07 DIAGNOSIS — E039 Hypothyroidism, unspecified: Secondary | ICD-10-CM

## 2023-05-07 DIAGNOSIS — I429 Cardiomyopathy, unspecified: Secondary | ICD-10-CM

## 2023-05-07 DIAGNOSIS — N182 Chronic kidney disease, stage 2 (mild): Secondary | ICD-10-CM | POA: Diagnosis not present

## 2023-05-07 DIAGNOSIS — M7061 Trochanteric bursitis, right hip: Secondary | ICD-10-CM

## 2023-05-07 DIAGNOSIS — E785 Hyperlipidemia, unspecified: Secondary | ICD-10-CM | POA: Diagnosis not present

## 2023-05-07 DIAGNOSIS — I152 Hypertension secondary to endocrine disorders: Secondary | ICD-10-CM

## 2023-05-07 DIAGNOSIS — R809 Proteinuria, unspecified: Secondary | ICD-10-CM | POA: Diagnosis not present

## 2023-05-07 DIAGNOSIS — E1169 Type 2 diabetes mellitus with other specified complication: Secondary | ICD-10-CM

## 2023-05-07 DIAGNOSIS — E1129 Type 2 diabetes mellitus with other diabetic kidney complication: Secondary | ICD-10-CM | POA: Diagnosis not present

## 2023-05-07 DIAGNOSIS — D638 Anemia in other chronic diseases classified elsewhere: Secondary | ICD-10-CM

## 2023-05-07 DIAGNOSIS — E1159 Type 2 diabetes mellitus with other circulatory complications: Secondary | ICD-10-CM | POA: Diagnosis not present

## 2023-05-07 DIAGNOSIS — J449 Chronic obstructive pulmonary disease, unspecified: Secondary | ICD-10-CM | POA: Diagnosis not present

## 2023-05-07 DIAGNOSIS — E1122 Type 2 diabetes mellitus with diabetic chronic kidney disease: Secondary | ICD-10-CM

## 2023-05-07 MED ORDER — LIDOCAINE-EPINEPHRINE (PF) 1 %-1:200000 IJ SOLN
4.0000 mL | Freq: Once | INTRAMUSCULAR | Status: AC
  Administered 2023-05-07: 4 mL via INTRADERMAL

## 2023-05-07 MED ORDER — LIDOCAINE-EPINEPHRINE (PF) 1 %-1:200000 IJ SOLN: 10.0000 mL | Freq: Once | INTRAMUSCULAR | Status: DC

## 2023-05-07 MED ORDER — LIDOCAINE HCL 1 % IJ SOLN: 10.0000 mL | Freq: Once | INTRAMUSCULAR | Status: DC

## 2023-05-07 MED ORDER — LIDOCAINE HCL 1 % IJ SOLN: 4.0000 mL | Freq: Once | INTRAMUSCULAR | Status: DC

## 2023-05-07 MED ORDER — METFORMIN HCL ER 500 MG PO TB24: ORAL_TABLET | ORAL | 3 refills | Status: DC

## 2023-05-07 MED ORDER — METHYLPREDNISOLONE ACETATE 40 MG/ML IJ SUSP
40.0000 mg | Freq: Once | INTRAMUSCULAR | Status: AC
Start: 2023-05-07 — End: 2023-05-07
  Administered 2023-05-07: 40 mg via INTRAMUSCULAR

## 2023-05-07 NOTE — Assessment & Plan Note (Signed)
Continue lisinopril and jardiance Recheck UACR today

## 2023-05-07 NOTE — Assessment & Plan Note (Signed)
COPD managed with Breo one puff daily. Well controlled.  Previously on Symbicort but switched due to insurance issues. Complaints of phlegm production. - Continue Breo one puff daily

## 2023-05-07 NOTE — Assessment & Plan Note (Signed)
F/b cards Continue to control BP well Continue lasix

## 2023-05-07 NOTE — Assessment & Plan Note (Signed)
Chronic right hip pain, likely bursitis, exacerbated by job-related activities and lying on the affected side. Previous steroid injection provided significant relief. Discussed risks of bleeding, infection, and potential temporary increase in blood sugar post-injection. - Administer steroid injection to right hip - Provide exercises to stretch hip - Educate on potential temporary increase in blood sugar post-injection - Discuss risks of bleeding and infection post-injection

## 2023-05-07 NOTE — Assessment & Plan Note (Signed)
Type 2 diabetes with recent A1c of 7.2. Currently on Ozempic 1 mg weekly, Metformin 1000 mg AM and 500 mg PM, and Jardiance 10 mg daily. Concerns about gastrointestinal side effects from Ozempic, including belching and gas, potentially exacerbated by overeating. Emphasized dietary habits, protein intake, hydration, and avoiding overeating to minimize side effects. - Continue Ozempic 1 mg weekly - Continue Metformin 1000 mg AM and 500 mg PM - Continue Jardiance 10 mg daily - Perform labs for A1c, thyroid, blood counts, B12, kidney and liver function - Recheck urine for protein levels - Educate on dietary habits to minimize gastrointestinal side effects, emphasizing protein intake, hydration, and avoiding overeating

## 2023-05-07 NOTE — Assessment & Plan Note (Signed)
Hypertension managed with Lisinopril 20 mg daily. - Continue Lisinopril 20 mg daily

## 2023-05-07 NOTE — Assessment & Plan Note (Signed)
Hypothyroidism managed with Levothyroxine 100 mcg daily. - Continue Levothyroxine 100 mcg daily

## 2023-05-07 NOTE — Assessment & Plan Note (Signed)
Chronic and stable Recheck metabolic panel Avoid nephrotoxic meds  

## 2023-05-07 NOTE — Assessment & Plan Note (Signed)
Hyperlipidemia managed with Atorvastatin 20 mg daily. Recent cholesterol levels within normal limits. - Continue Atorvastatin 20 mg daily

## 2023-05-07 NOTE — Progress Notes (Signed)
Established Patient Office Visit  Subjective   Patient ID: Emma Fletcher, female    DOB: October 14, 1955  Age: 67 y.o. MRN: [id number]  Chief Complaint  Patient presents with   Medical Management of Chronic Issues    3 month follow-up. Avg sugar 120s and home blood pressure 110's/80s. Reports it being 125/79 this morning. No symptoms to report.     HPI  Discussed the use of AI scribe software for clinical note transcription with the patient, who gave verbal consent to proceed.  History of Present Illness   The patient, with a history of diabetes, presents with right hip pain that has been bothering her for approximately two weeks. The pain is located on the side of the hip and has been worsening, particularly with activities such as climbing stairs. The patient has a history of receiving a shot for similar hip pain a few years ago, which provided significant relief.  In addition to the hip pain, the patient has been experiencing excessive burping and gas for an unspecified duration. She attributes these symptoms to eating too quickly or overeating. The patient also mentions an episode of illness a few weeks ago, which she believes may have been related to her diabetes medication, Ozempic.  The patient's sister expresses concern about the patient's eating habits, particularly overeating during large meals and not eating enough protein. The patient acknowledges these concerns and expresses a willingness to modify her eating habits to manage her symptoms better.         ROS    Objective:     BP 138/82 (BP Location: Right Arm, Patient Position: Sitting, Cuff Size: Large)   Pulse 72   Ht 5\' 6"  (1.676 m)   Wt 183 lb (83 kg)   LMP  (LMP Unknown)   SpO2 100%   BMI 29.54 kg/m    Physical Exam Vitals reviewed.  Constitutional:      General: She is not in acute distress.    Appearance: Normal appearance. She is well-developed. She is not diaphoretic.  HENT:     Head:  Normocephalic and atraumatic.  Eyes:     General: No scleral icterus.    Conjunctiva/sclera: Conjunctivae normal.  Neck:     Thyroid: No thyromegaly.  Cardiovascular:     Rate and Rhythm: Normal rate and regular rhythm.     Heart sounds: Murmur heard.  Pulmonary:     Effort: Pulmonary effort is normal. No respiratory distress.     Breath sounds: Normal breath sounds. No wheezing, rhonchi or rales.  Abdominal:     General: There is no distension.     Palpations: Abdomen is soft.     Tenderness: There is no abdominal tenderness.  Musculoskeletal:     Cervical back: Neck supple.     Right lower leg: No edema.     Left lower leg: No edema.     Comments: TTP over R greater trochanter No decreased ROM  Lymphadenopathy:     Cervical: No cervical adenopathy.  Skin:    General: Skin is warm and dry.     Findings: No rash.  Neurological:     Mental Status: She is alert and oriented to person, place, and time. Mental status is at baseline.  Psychiatric:        Mood and Affect: Mood normal.        Behavior: Behavior normal.      No results found for any visits on 05/07/23.    The 10-year ASCVD  risk score (Arnett DK, et al., 2019) is: 15%    Assessment & Plan:   Problem List Items Addressed This Visit       Cardiovascular and Mediastinum   Hypertension associated with diabetes (HCC)    Hypertension managed with Lisinopril 20 mg daily. - Continue Lisinopril 20 mg daily      Relevant Medications   metFORMIN (GLUCOPHAGE-XR) 500 MG 24 hr tablet   Other Relevant Orders   Comprehensive metabolic panel   Secondary cardiomyopathy (HCC)    F/b cards Continue to control BP well Continue lasix        Respiratory   Chronic obstructive pulmonary disease (HCC)    COPD managed with Breo one puff daily. Well controlled.  Previously on Symbicort but switched due to insurance issues. Complaints of phlegm production. - Continue Breo one puff daily        Endocrine   T2DM  (type 2 diabetes mellitus) (HCC) - Primary    Type 2 diabetes with recent A1c of 7.2. Currently on Ozempic 1 mg weekly, Metformin 1000 mg AM and 500 mg PM, and Jardiance 10 mg daily. Concerns about gastrointestinal side effects from Ozempic, including belching and gas, potentially exacerbated by overeating. Emphasized dietary habits, protein intake, hydration, and avoiding overeating to minimize side effects. - Continue Ozempic 1 mg weekly - Continue Metformin 1000 mg AM and 500 mg PM - Continue Jardiance 10 mg daily - Perform labs for A1c, thyroid, blood counts, B12, kidney and liver function - Recheck urine for protein levels - Educate on dietary habits to minimize gastrointestinal side effects, emphasizing protein intake, hydration, and avoiding overeating      Relevant Medications   metFORMIN (GLUCOPHAGE-XR) 500 MG 24 hr tablet   Other Relevant Orders   Hemoglobin A1c   Urine Microalbumin w/creat. ratio   Hyperlipidemia associated with type 2 diabetes mellitus (HCC)    Hyperlipidemia managed with Atorvastatin 20 mg daily. Recent cholesterol levels within normal limits. - Continue Atorvastatin 20 mg daily      Relevant Medications   metFORMIN (GLUCOPHAGE-XR) 500 MG 24 hr tablet   Other Relevant Orders   Comprehensive metabolic panel   Hypothyroidism    Hypothyroidism managed with Levothyroxine 100 mcg daily. - Continue Levothyroxine 100 mcg daily      Relevant Orders   TSH   Microalbuminuria due to type 2 diabetes mellitus (HCC)    Continue lisinopril and jardiance Recheck UACR today      Relevant Medications   metFORMIN (GLUCOPHAGE-XR) 500 MG 24 hr tablet   Other Relevant Orders   Urine Microalbumin w/creat. ratio     Musculoskeletal and Integument   Trochanteric bursitis of right hip    Chronic right hip pain, likely bursitis, exacerbated by job-related activities and lying on the affected side. Previous steroid injection provided significant relief. Discussed risks of  bleeding, infection, and potential temporary increase in blood sugar post-injection. - Administer steroid injection to right hip - Provide exercises to stretch hip - Educate on potential temporary increase in blood sugar post-injection - Discuss risks of bleeding and infection post-injection        Genitourinary   CKD (chronic kidney disease), stage II    Chronic and stable Recheck metabolic panel Avoid nephrotoxic meds       Relevant Orders   Comprehensive metabolic panel     Other   Anemia of chronic disease    Continue to monitor CBC and B12 every 6 months      Relevant Orders  CBC w/Diff/Platelet   B12       General Health Maintenance Routine health maintenance including regular lab work and medication review. Emphasis on dietary habits and hydration. - Perform labs for A1c, thyroid, blood counts, B12, kidney and liver function - Recheck urine for protein levels - Educate on dietary habits, emphasizing protein intake, hydration, and avoiding overeating - Encourage regular intake of fruits and vegetables to prevent constipation  Follow-up - Schedule follow-up appointment in three months - Consider extending follow-up to six months if conditions are well-managed.       INJECTION: Patient was given informed consent,. Appropriate time out was taken. Area prepped and draped in usual sterile fashion. 1 cc of depo-medrol 40 mg/ml plus  4 cc of 1% lidocaine with epinephrine was injected into the R trochanteric bursa using a(n) lateral approach. The patient tolerated the procedure well. There were no complications. Post procedure instructions were given.   Return in about 3 months (around 08/07/2023) for chronic disease f/u.    Shirlee Latch, MD

## 2023-05-07 NOTE — Assessment & Plan Note (Signed)
Continue to monitor CBC and B12 every 6 months

## 2023-05-08 ENCOUNTER — Other Ambulatory Visit: Payer: Self-pay

## 2023-05-08 DIAGNOSIS — E1122 Type 2 diabetes mellitus with diabetic chronic kidney disease: Secondary | ICD-10-CM

## 2023-05-08 LAB — CBC WITH DIFFERENTIAL/PLATELET
Basophils Absolute: 0 10*3/uL (ref 0.0–0.2)
Basos: 0 %
EOS (ABSOLUTE): 0.1 10*3/uL (ref 0.0–0.4)
Eos: 1 %
Hematocrit: 36.6 % (ref 34.0–46.6)
Hemoglobin: 11.8 g/dL (ref 11.1–15.9)
Immature Grans (Abs): 0 10*3/uL (ref 0.0–0.1)
Immature Granulocytes: 0 %
Lymphocytes Absolute: 1.6 10*3/uL (ref 0.7–3.1)
Lymphs: 16 %
MCH: 29.9 pg (ref 26.6–33.0)
MCHC: 32.2 g/dL (ref 31.5–35.7)
MCV: 93 fL (ref 79–97)
Monocytes Absolute: 0.6 10*3/uL (ref 0.1–0.9)
Monocytes: 6 %
Neutrophils Absolute: 7.5 10*3/uL — ABNORMAL HIGH (ref 1.4–7.0)
Neutrophils: 77 %
Platelets: 327 10*3/uL (ref 150–450)
RBC: 3.95 x10E6/uL (ref 3.77–5.28)
RDW: 11.9 % (ref 11.7–15.4)
WBC: 9.7 10*3/uL (ref 3.4–10.8)

## 2023-05-08 LAB — COMPREHENSIVE METABOLIC PANEL
ALT: 12 [IU]/L (ref 0–32)
AST: 12 [IU]/L (ref 0–40)
Albumin: 4.6 g/dL (ref 3.9–4.9)
Alkaline Phosphatase: 151 [IU]/L — ABNORMAL HIGH (ref 44–121)
BUN/Creatinine Ratio: 26 (ref 12–28)
BUN: 22 mg/dL (ref 8–27)
Bilirubin Total: 0.3 mg/dL (ref 0.0–1.2)
CO2: 19 mmol/L — ABNORMAL LOW (ref 20–29)
Calcium: 10.4 mg/dL — ABNORMAL HIGH (ref 8.7–10.3)
Chloride: 100 mmol/L (ref 96–106)
Creatinine, Ser: 0.84 mg/dL (ref 0.57–1.00)
Globulin, Total: 2.8 g/dL (ref 1.5–4.5)
Glucose: 148 mg/dL — ABNORMAL HIGH (ref 70–99)
Potassium: 4.8 mmol/L (ref 3.5–5.2)
Sodium: 138 mmol/L (ref 134–144)
Total Protein: 7.4 g/dL (ref 6.0–8.5)
eGFR: 77 mL/min/{1.73_m2} (ref >59)

## 2023-05-08 LAB — HEMOGLOBIN A1C
Est. average glucose Bld gHb Est-mCnc: 177 mg/dL
Hgb A1c MFr Bld: 7.8 % — ABNORMAL HIGH (ref 4.8–5.6)

## 2023-05-08 LAB — MICROALBUMIN / CREATININE URINE RATIO
Creatinine, Urine: 23.6 mg/dL
Microalb/Creat Ratio: 79 mg/g{creat} — ABNORMAL HIGH (ref 0–29)
Microalbumin, Urine: 18.6 ug/mL

## 2023-05-08 LAB — TSH: TSH: 1 u[IU]/mL (ref 0.450–4.500)

## 2023-05-08 LAB — VITAMIN B12: Vitamin B-12: 273 pg/mL (ref 232–1245)

## 2023-05-08 MED ORDER — EMPAGLIFLOZIN 25 MG PO TABS: 25.0000 mg | ORAL_TABLET | Freq: Every day | ORAL | 1 refills | Status: DC

## 2023-05-25 ENCOUNTER — Other Ambulatory Visit: Payer: Self-pay | Admitting: Family Medicine

## 2023-05-25 NOTE — Telephone Encounter (Unsigned)
Copied from CRM [id number]. Topic: General - Other >> May 25, 2023  9:16 AM Everette C wrote: Reason for CRM: Medication Refill - Most Recent Primary Care Visit:  Provider: Erasmo Downer Department: [facility] Visit Type: OFFICE VISIT Date: 05/07/2023  Medication: metFORMIN (GLUCOPHAGE-XR) 500 MG 24 hr tablet [[id number]]  Has the patient contacted their pharmacy? Yes (Agent: If no, request that the patient contact the pharmacy for the refill. If patient does not wish to contact the pharmacy document the reason why and proceed with request.) (Agent: If yes, when and what did the pharmacy advise?)  Is this the correct pharmacy for this prescription? Yes If no, delete pharmacy and type the correct one.  This is the patient's preferred pharmacy:  [id number] - [place], [place] - [address] AT [facility] OF [address] & [address] [place] [address] Phone: [phone] Fax: [phone]  Has the prescription been filled recently? No  Is the patient out of the medication? Yes  Has the patient been seen for an appointment in the last year OR does the patient have an upcoming appointment? Yes  Can we respond through MyChart? No  Agent: Please be advised that Rx refills may take up to 3 business days. We ask that you follow-up with your pharmacy.

## 2023-05-28 MED ORDER — METFORMIN HCL ER 500 MG PO TB24: ORAL_TABLET | ORAL | 1 refills | Status: DC

## 2023-05-28 NOTE — Telephone Encounter (Signed)
Requested Prescriptions  Pending Prescriptions Disp Refills   metFORMIN (GLUCOPHAGE-XR) 500 MG 24 hr tablet 270 tablet 1    Sig: Take 2 tablets (1,000 mg total) by mouth daily with breakfast AND 1 tablet (500 mg total) at bedtime.     Endocrinology:  Diabetes - Biguanides Passed - 05/25/2023 11:12 AM      Passed - Cr in normal range and within 360 days    Creatinine, Ser  Date Value Ref Range Status  05/07/2023 0.84 0.57 - 1.00 mg/dL Final         Passed - HBA1C is between 0 and 7.9 and within 180 days    Hgb A1c MFr Bld  Date Value Ref Range Status  05/07/2023 7.8 (H) 4.8 - 5.6 % Final    Comment:             Prediabetes: 5.7 - 6.4          Diabetes: >6.4          Glycemic control for adults with diabetes: <7.0          Passed - eGFR in normal range and within 360 days    GFR calc Af Amer  Date Value Ref Range Status  04/12/2020 103 >59 mL/min/1.73 Final    Comment:    **In accordance with recommendations from the NKF-ASN Task force,**   [facility] is in the process of updating its eGFR calculation to the   2021 CKD-EPI creatinine equation that estimates kidney function   without a race variable.    GFR calc non Af Amer  Date Value Ref Range Status  04/12/2020 89 >59 mL/min/1.73 Final   eGFR  Date Value Ref Range Status  05/07/2023 77 >59 mL/min/1.73 Final         Passed - B12 Level in normal range and within 720 days    Vitamin B-12  Date Value Ref Range Status  05/07/2023 273 232 - 1,245 pg/mL Final         Passed - Valid encounter within last 6 months    Recent Outpatient Visits           3 weeks ago Type 2 diabetes mellitus with stage 2 chronic kidney disease, without long-term current use of insulin (HCC)   [place] [facility] [place], Marzella Schlein, MD   3 months ago Hypertension associated with diabetes [facility])   [place] [facility] [place], Marzella Schlein, MD   6 months ago Type 2 diabetes mellitus without complication,  without long-term current use of insulin [facility])   [place] [facility] [place], Marzella Schlein, MD   1 year ago Type 2 diabetes mellitus with other specified complication, without long-term current use of insulin [facility])   [place] [facility] [place], Marzella Schlein, MD   1 year ago Abscess   [place] [facility] [place], Marzella Schlein, MD       Future Appointments             In 2 months Bacigalupo, Marzella Schlein, MD [facility]            Passed - CBC within normal limits and completed in the last 12 months    WBC  Date Value Ref Range Status  05/07/2023 9.7 3.4 - 10.8 x10E3/uL Final    Comment:    **Effective May 21, 2023 profile [id number] WBC will be made**   non-orderable as a stand-alone order code.  02/13/2019 10.7 (H) 4.0 - 10.5 K/uL Final   RBC  Date Value Ref Range Status  05/07/2023 3.95 3.77 - 5.28 x10E6/uL Final  02/13/2019 3.79 (L) 3.87 - 5.11 MIL/uL Final   Hemoglobin  Date Value Ref Range Status  05/07/2023 11.8 11.1 - 15.9 g/dL Final   Hematocrit  Date Value Ref Range Status  05/07/2023 36.6 34.0 - 46.6 % Final   MCHC  Date Value Ref Range Status  05/07/2023 32.2 31.5 - 35.7 g/dL Final  91/47/8295 62.1 30.0 - 36.0 g/dL Final   [facility]  Date Value Ref Range Status  05/07/2023 29.9 26.6 - 33.0 pg Final  02/13/2019 29.6 26.0 - 34.0 pg Final   MCV  Date Value Ref Range Status  05/07/2023 93 79 - 97 fL Final   No results found for: "PLTCOUNTKUC", "LABPLAT", "POCPLA" RDW  Date Value Ref Range Status  05/07/2023 11.9 11.7 - 15.4 % Final

## 2023-06-21 ENCOUNTER — Telehealth: Payer: Self-pay

## 2023-06-21 NOTE — Telephone Encounter (Signed)
 Copied from CRM [id number]. Topic: General - Other >> Jun 19, 2023 11:35 AM Macon Large wrote: Reason for CRM: Pt reports that she missed a call from the office. Pt requests to be contacted at the home # [phone]

## 2023-06-22 NOTE — Telephone Encounter (Signed)
 I have not

## 2023-06-25 ENCOUNTER — Encounter: Payer: Self-pay | Admitting: Podiatry

## 2023-06-25 ENCOUNTER — Ambulatory Visit (INDEPENDENT_AMBULATORY_CARE_PROVIDER_SITE_OTHER): Payer: Medicare Other | Admitting: Podiatry

## 2023-06-25 DIAGNOSIS — M79676 Pain in unspecified toe(s): Secondary | ICD-10-CM

## 2023-06-25 DIAGNOSIS — E1122 Type 2 diabetes mellitus with diabetic chronic kidney disease: Secondary | ICD-10-CM

## 2023-06-25 DIAGNOSIS — B351 Tinea unguium: Secondary | ICD-10-CM | POA: Diagnosis not present

## 2023-06-25 DIAGNOSIS — N182 Chronic kidney disease, stage 2 (mild): Secondary | ICD-10-CM

## 2023-07-01 NOTE — Progress Notes (Signed)
  Subjective:  Patient ID: Emma Fletcher, female    DOB: 20-Oct-1955,  MRN: [id number]  68 y.o. female presents at risk foot care. Pt has h/o NIDDM with chronic kidney disease and painful elongated mycotic toenails 1-5 bilaterally which are tender when wearing enclosed shoe gear. Pain is relieved with periodic professional debridement.  New problem(s): None   PCP is Myrla Jon HERO, MD , and last visit was May 07, 2023.  No Known Allergies  Review of Systems: Negative except as noted in the HPI.   Objective:  FLORINA GLAS is a pleasant 68 y.o. female in NAD. AAO x 3.  Vascular Examination: Capillary refill time immediate b/l. Vascular status intact b/l with palpable pedal pulses. Pedal hair present b/l. No pain with calf compression b/l. Skin temperature gradient WNL b/l. No cyanosis or clubbing b/l. No ischemia or gangrene noted b/l.   Neurological Examination: Sensation grossly intact b/l with 10 gram monofilament. Vibratory sensation intact b/l.  Dermatological Examination: Pedal skin with normal turgor, texture and tone b/l. No open wounds nor interdigital macerations noted. Toenails 1-5 b/l thick, discolored, elongated with subungual debris and pain on dorsal palpation. No hyperkeratotic lesions noted b/l.   Musculoskeletal Examination: Muscle strength 5/5 to b/l LE.  No pain, crepitus noted b/l. No gross pedal deformities. Patient ambulates independently without assistive aids.   Radiographs: None  Last A1c:      Latest Ref Rng & Units 05/07/2023    9:00 AM 02/05/2023    8:19 AM 10/31/2022    8:19 AM  Hemoglobin A1C  Hemoglobin-A1c 4.8 - 5.6 % 7.8  7.2  7.7     Assessment:   1. Pain due to onychomycosis of toenail   2. Type 2 diabetes mellitus with stage 2 chronic kidney disease, without long-term current use of insulin  (HCC)    Plan:  Patient was evaluated and treated. All patient's and/or POA's questions/concerns addressed on today's visit. Toenails  1-5 debrided in length and girth without incident. Continue soft, supportive shoe gear daily. Report any pedal injuries to medical professional. Call office if there are any questions/concerns. -Continue foot and shoe inspections daily. Monitor blood glucose per PCP/Endocrinologist's recommendations. -Patient/POA to call should there be question/concern in the interim.  Return in about 3 months (around 09/23/2023).  Delon LITTIE Merlin, DPM      [place] LOCATION: [address] [place] [address]                   Office [phone]   [facility] LOCATION: [address] [place], [place] [address] Office [phone]

## 2023-08-01 ENCOUNTER — Other Ambulatory Visit: Payer: Self-pay | Admitting: Family Medicine

## 2023-08-01 NOTE — Telephone Encounter (Unsigned)
Copied from CRM [id number]. Topic: General - Other >> Aug 01, 2023  8:45 AM Everette C wrote: Reason for CRM: Medication Refill - Most Recent Primary Care Visit:  Provider: Erasmo Downer Department: [id number] Visit Type: OFFICE VISIT Date: 05/07/2023  Medication: Semaglutide, 1 MG/DOSE, (OZEMPIC, 1 MG/DOSE,) 4 MG/3ML SOPN [[id number]]  Has the patient contacted their pharmacy? Yes (Agent: If no, request that the patient contact the pharmacy for the refill. If patient does not wish to contact the pharmacy document the reason why and proceed with request.) (Agent: If yes, when and what did the pharmacy advise?)  Is this the correct pharmacy for this prescription? No If no, delete pharmacy and type the correct one.  This is the patient's preferred pharmacy:  [id number] - [place], [place] - [address] AT [facility] OF [address] [facility] [address] [place] [address] Phone: [phone] Fax: [phone]  Has the prescription been filled recently? Yes  Is the patient out of the medication? Yes  Has the patient been seen for an appointment in the last year OR does the patient have an upcoming appointment? Yes  Can we respond through MyChart? No  Agent: Please be advised that Rx refills may take up to 3 business days. We ask that you follow-up with your pharmacy.

## 2023-08-02 MED ORDER — OZEMPIC (1 MG/DOSE) 4 MG/3ML ~~LOC~~ SOPN: PEN_INJECTOR | SUBCUTANEOUS | 2 refills | Status: DC

## 2023-08-02 NOTE — Telephone Encounter (Signed)
Requested Prescriptions  Pending Prescriptions Disp Refills   Semaglutide, 1 MG/DOSE, (OZEMPIC, 1 MG/DOSE,) 4 MG/3ML SOPN 3 mL 2    Sig: INJECT 1 MG UNDER THE SKIN ONE TIME WEEKLY AS DIRECTED     Endocrinology:  Diabetes - GLP-1 Receptor Agonists - semaglutide Failed - 08/02/2023  8:18 AM      Failed - HBA1C in normal range and within 180 days    Hgb A1c MFr Bld  Date Value Ref Range Status  05/07/2023 7.8 (H) 4.8 - 5.6 % Final    Comment:             Prediabetes: 5.7 - 6.4          Diabetes: >6.4          Glycemic control for adults with diabetes: <7.0          Passed - Cr in normal range and within 360 days    Creatinine, Ser  Date Value Ref Range Status  05/07/2023 0.84 0.57 - 1.00 mg/dL Final         Passed - Valid encounter within last 6 months    Recent Outpatient Visits           2 months ago Type 2 diabetes mellitus with stage 2 chronic kidney disease, without long-term current use of insulin (HCC)   [place] [facility] [place], Marzella Schlein, MD   5 months ago Hypertension associated with diabetes [facility])   [place] [facility] [place], Marzella Schlein, MD   9 months ago Type 2 diabetes mellitus without complication, without long-term current use of insulin [facility])   [place] [facility] [place], Marzella Schlein, MD   1 year ago Type 2 diabetes mellitus with other specified complication, without long-term current use of insulin [facility])   [place] [facility] [place], Marzella Schlein, MD   1 year ago Abscess   [place] [facility] [place], Marzella Schlein, MD       Future Appointments             In 4 days Bacigalupo, Marzella Schlein, MD [facility], [facility]

## 2023-08-06 ENCOUNTER — Encounter: Payer: Self-pay | Admitting: Family Medicine

## 2023-08-06 ENCOUNTER — Ambulatory Visit: Payer: Medicare Other | Admitting: Family Medicine

## 2023-08-06 VITALS — BP 126/86 | HR 74 | Temp 98.6°F | Ht 65.0 in | Wt 165.7 lb

## 2023-08-06 DIAGNOSIS — J449 Chronic obstructive pulmonary disease, unspecified: Secondary | ICD-10-CM | POA: Diagnosis not present

## 2023-08-06 DIAGNOSIS — Z7984 Long term (current) use of oral hypoglycemic drugs: Secondary | ICD-10-CM | POA: Diagnosis not present

## 2023-08-06 DIAGNOSIS — Z7985 Long-term (current) use of injectable non-insulin antidiabetic drugs: Secondary | ICD-10-CM

## 2023-08-06 DIAGNOSIS — R0981 Nasal congestion: Secondary | ICD-10-CM

## 2023-08-06 DIAGNOSIS — I152 Hypertension secondary to endocrine disorders: Secondary | ICD-10-CM

## 2023-08-06 DIAGNOSIS — N182 Chronic kidney disease, stage 2 (mild): Secondary | ICD-10-CM

## 2023-08-06 DIAGNOSIS — E1159 Type 2 diabetes mellitus with other circulatory complications: Secondary | ICD-10-CM

## 2023-08-06 DIAGNOSIS — E1122 Type 2 diabetes mellitus with diabetic chronic kidney disease: Secondary | ICD-10-CM

## 2023-08-06 LAB — POCT GLYCOSYLATED HEMOGLOBIN (HGB A1C): Hemoglobin A1C: 15 % — AB (ref 4.0–5.6)

## 2023-08-06 MED ORDER — BUDESONIDE-FORMOTEROL FUMARATE 160-4.5 MCG/ACT IN AERO: 2.0000 | INHALATION_SPRAY | Freq: Two times a day (BID) | RESPIRATORY_TRACT | 3 refills | Status: DC

## 2023-08-06 NOTE — Assessment & Plan Note (Signed)
Follow-up for Type 2 Diabetes Mellitus. A1c increased from 7.2 to 7.8. Home blood glucose readings around 140 mg/dL. Increased Jardiance from 10 mg to 25 mg daily. Patient is also on Ozempic and Metformin. Discussed dietary modifications, emphasizing reduced carbohydrate intake and increased protein and green vegetables. Encouraged regular exercise. Emphasized the importance of diet and exercise in managing blood glucose levels to avoid further medication increases.   - Perform finger stick for blood glucose   - Order A1c test  - if decreasing, no changes to meds (only lifestyle changes), if higher, will increase ozempic to 2mg  weekly. - POC A1c resulted as >15 (which is much higher than she has ever been), so will confirm with lab drawn A1c - Continue Jardiance 25 mg daily   - Continue Ozempic 1 mg weekly   - Continue Metformin 1000 mg in the morning and 500 mg at bedtime   - Encourage dietary modifications: reduce carbohydrate intake, increase protein and green vegetables   - Encourage regular exercise: 30 minutes daily   - Follow up in 3 months

## 2023-08-06 NOTE — Assessment & Plan Note (Signed)
 Hypertension managed with Lisinopril 20 mg daily. - Continue Lisinopril 20 mg daily

## 2023-08-06 NOTE — Assessment & Plan Note (Signed)
Patient prefers Symbicort over current inhaler. Previous issues with insurance coverage for Symbicort. Discussed that if Symbicort is not covered, will need to continue current inhaler.   - [facility] prescription for [facility] to [facility]   - Discontinue Breo if Symbicort is approved

## 2023-08-06 NOTE — Progress Notes (Signed)
Established patient visit   Patient: Emma Fletcher   DOB: [birth year]   67 y.o. Female  MRN: [id number] Visit Date: 08/06/2023  Today's healthcare provider: Shirlee Latch, MD   Chief Complaint  Patient presents with   Medical Management of Chronic Issues    3 month follow-up. Patient taking medications as prescirbed with no side effects   Diabetes    Monitors at home with avg readings of 133-140. Reports excessive thirst at night and no other symptoms to report   Hyperlipidemia   Hypertension    Monitors at home running about 100-120/70-80   Hypothyroidism   Ear Pain    Pt reports ear pain since last week along with some throat soreness. Taking mucinex dm. No fevers to report   Subjective    Diabetes  Hyperlipidemia  Hypertension   HPI     Medical Management of Chronic Issues    Additional comments: 3 month follow-up. Patient taking medications as prescirbed with no side effects        Diabetes    Additional comments: Monitors at home with avg readings of 133-140. Reports excessive thirst at night and no other symptoms to report        Hypertension    Additional comments: Monitors at home running about 100-120/70-80        Ear Pain    Additional comments: Pt reports ear pain since last week along with some throat soreness. Taking mucinex dm. No fevers to report      Last edited by Acey Lav, CMA on 08/06/2023  8:19 AM.       Discussed the use of AI scribe software for clinical note transcription with the patient, who gave verbal consent to proceed.  History of Present Illness   The patient, with a history of diabetes, presents for a follow-up appointment. Previously, her A1c levels had increased from 7.2 to 7.8, leading to an increase in her Jardiance dosage from 10 to 25mg  daily. The patient has been adhering to this regimen without any reported issues. She also continues to take Ozempic and Metformin. The patient reports that her  blood sugar levels at home have been around 140. There was a one-week interruption in her Ozempic treatment due to a delay in mail delivery, but the medication has since been resumed.  In addition to her diabetes management, the patient has been experiencing congestion for a couple of days and reports a sensation of talking in a tunnel. She has been taking Mucinex since Wednesday and uses Flonase regularly. The patient also reports a lot of ear wax in both ears, which may be affecting her hearing.  The patient's diet and exercise habits were also discussed. The patient's family member, who is participating in a weight loss program, shared some dietary advice about eating slowly, stopping when full, and prioritizing protein intake. The patient admits to a preference for carbohydrates. The family member also encourages the patient to use her stationary bike and power chair for exercise.         Medications: Outpatient Medications Prior to Visit  Medication Sig   ascorbic acid (VITAMIN C) 500 MG tablet Take 500 mg by mouth daily.   atorvastatin (LIPITOR) 20 MG tablet Take 1 tablet (20 mg total) by mouth daily.   Blood Glucose Monitoring Suppl (ONE TOUCH ULTRA 2) w/Device KIT Use as directed to check blood sugar once daily   Cholecalciferol (VITAMIN D3) 125 MCG (5000 UT) CAPS Take 1 capsule (5,000  Units total) by mouth daily. TAKE 1 CAPSULE EVERY DAY   empagliflozin (JARDIANCE) 25 MG TABS tablet Take 1 tablet (25 mg total) by mouth daily before breakfast.   Ferrous Sulfate (IRON) 325 (65 Fe) MG TABS Take 2 tablets by mouth daily.   furosemide (LASIX) 20 MG tablet Take 1 tablet (20 mg total) by mouth daily.   glucose blood (ONE TOUCH ULTRA TEST) test strip Use as instructed to check blood glucose daily   lansoprazole (PREVACID) 30 MG capsule Take 1 capsule (30 mg total) by mouth daily.   levothyroxine (SYNTHROID) 100 MCG tablet Take 1 tablet (100 mcg total) by mouth daily.   lisinopril (ZESTRIL) 20  MG tablet Take 1 tablet (20 mg total) by mouth daily.   metFORMIN (GLUCOPHAGE-XR) 500 MG 24 hr tablet Take 2 tablets (1,000 mg total) by mouth daily with breakfast AND 1 tablet (500 mg total) at bedtime.   Multiple Vitamins-Minerals (ONE-A-DAY WOMENS 50 PLUS PO) Take 1 tablet by mouth daily.   OneTouch Delica Lancets 33G MISC Use as directed to check blood glucose daily   polyethylene glycol powder (GLYCOLAX/MIRALAX) 17 GM/SCOOP powder Please use one cup of miralax daily.   Semaglutide, 1 MG/DOSE, (OZEMPIC, 1 MG/DOSE,) 4 MG/3ML SOPN INJECT 1 MG UNDER THE SKIN ONE TIME WEEKLY AS DIRECTED   [DISCONTINUED] fluticasone furoate-vilanterol (BREO ELLIPTA) 100-25 MCG/ACT AEPB Inhale 1 puff into the lungs daily. (Patient not taking: Reported on 08/06/2023)   No facility-administered medications prior to visit.    Review of Systems     Objective    BP 126/86 (BP Location: Left Arm, Patient Position: Sitting, Cuff Size: Normal)   Pulse 74   Temp 98.6 F (37 C) (Oral)   Ht 5\' 5"  (1.651 m)   Wt 165 lb 11.2 oz (75.2 kg)   LMP  (LMP Unknown)   SpO2 100%   BMI 27.57 kg/m    Physical Exam Vitals reviewed.  Constitutional:      General: She is not in acute distress.    Appearance: Normal appearance. She is well-developed. She is not diaphoretic.  HENT:     Head: Normocephalic and atraumatic.     Right Ear: External ear normal. There is impacted cerumen.     Left Ear: External ear normal. There is impacted cerumen.     Nose: Congestion present.     Mouth/Throat:     Mouth: Mucous membranes are moist.     Pharynx: Oropharynx is clear. Posterior oropharyngeal erythema (mild) present. No oropharyngeal exudate.  Eyes:     General: No scleral icterus.    Conjunctiva/sclera: Conjunctivae normal.  Neck:     Thyroid: No thyromegaly.  Cardiovascular:     Rate and Rhythm: Normal rate and regular rhythm.     Heart sounds: Normal heart sounds. No murmur heard. Pulmonary:     Effort: Pulmonary  effort is normal. No respiratory distress.     Breath sounds: Normal breath sounds. No wheezing, rhonchi or rales.  Musculoskeletal:     Cervical back: Neck supple.     Right lower leg: No edema.     Left lower leg: No edema.  Lymphadenopathy:     Cervical: No cervical adenopathy.  Skin:    General: Skin is warm and dry.  Neurological:     Mental Status: She is alert. Mental status is at baseline.  Psychiatric:        Mood and Affect: Mood normal.        Behavior: Behavior normal.  Results for orders placed or performed in visit on 08/06/23  POCT HgB A1C  Result Value Ref Range   Hemoglobin A1C 15.0 (A) 4.0 - 5.6 %   HbA1c POC (<> result, manual entry)     HbA1c, POC (prediabetic range)     HbA1c, POC (controlled diabetic range)      Assessment & Plan     Problem List Items Addressed This Visit       Cardiovascular and Mediastinum   Hypertension associated with diabetes (HCC)   Hypertension managed with Lisinopril 20 mg daily. - Continue Lisinopril 20 mg daily        Respiratory   Chronic obstructive pulmonary disease (HCC)   Patient prefers Symbicort over current inhaler. Previous issues with insurance coverage for Symbicort. Discussed that if Symbicort is not covered, will need to continue current inhaler.   - Send prescription for Symbicort to [facility]   - Discontinue Breo if Symbicort is approved        Relevant Medications   budesonide-formoterol (SYMBICORT) 160-4.5 MCG/ACT inhaler     Endocrine   T2DM (type 2 diabetes mellitus) (HCC) - Primary   Follow-up for Type 2 Diabetes Mellitus. A1c increased from 7.2 to 7.8. Home blood glucose readings around 140 mg/dL. Increased Jardiance from 10 mg to 25 mg daily. Patient is also on Ozempic and Metformin. Discussed dietary modifications, emphasizing reduced carbohydrate intake and increased protein and green vegetables. Encouraged regular exercise. Emphasized the importance of diet and exercise in managing blood  glucose levels to avoid further medication increases.   - Perform finger stick for blood glucose   - Order A1c test  - if decreasing, no changes to meds (only lifestyle changes), if higher, will increase ozempic to 2mg  weekly. - POC A1c resulted as >15 (which is much higher than she has ever been), so will confirm with lab drawn A1c - Continue Jardiance 25 mg daily   - Continue Ozempic 1 mg weekly   - Continue Metformin 1000 mg in the morning and 500 mg at bedtime   - Encourage dietary modifications: reduce carbohydrate intake, increase protein and green vegetables   - Encourage regular exercise: 30 minutes daily   - Follow up in 3 months        Relevant Orders   POCT HgB A1C (Completed)   Hemoglobin A1c   Other Visit Diagnoses       Sinus congestion           Assessment and Plan    Eustachian Tube Dysfunction   Symptoms of ear fullness and hearing like in a tunnel. Likely due to eustachian tube dysfunction. Noted significant earwax buildup.   - Recommend Debrox (over-the-counter) for earwax softening   - Consider ear cleaning if symptoms persist    Upper Respiratory Infection   Symptoms include congestion, dry mouth, and sore throat. Likely viral etiology. No signs of strep throat. Patient has been using Mucinex. Discussed that Z-Paks are not effective for viral infections and overuse has reduced their efficacy for bacterial infections.   - Continue Mucinex   - Monitor symptoms for 7-10 days   - If symptoms persist beyond 7-10 days, consider evaluation for bacterial sinus infection    Follow-up   - Notify patient of A1c results   - If A1c is less than previous but not below 7, focus on diet and exercise   - If A1c is above 8, consider increasing Ozempic to 2 mg   - Follow up in 3 months.  Return in about 3 months (around 11/03/2023) for chronic disease f/u.       Shirlee Latch, MD  [facility] [phone] (phone) [phone]  (fax)  [facility]

## 2023-08-07 ENCOUNTER — Other Ambulatory Visit: Payer: Self-pay

## 2023-08-07 ENCOUNTER — Ambulatory Visit (INDEPENDENT_AMBULATORY_CARE_PROVIDER_SITE_OTHER): Payer: Medicare Other

## 2023-08-07 DIAGNOSIS — Z0000 Encounter for general adult medical examination without abnormal findings: Secondary | ICD-10-CM | POA: Diagnosis not present

## 2023-08-07 LAB — HEMOGLOBIN A1C
Est. average glucose Bld gHb Est-mCnc: >398 mg/dL
Hgb A1c MFr Bld: >15.5 % — ABNORMAL HIGH (ref 4.8–5.6)

## 2023-08-07 MED ORDER — SEMAGLUTIDE (2 MG/DOSE) 8 MG/3ML ~~LOC~~ SOPN: 2.0000 mg | PEN_INJECTOR | SUBCUTANEOUS | 2 refills | Status: DC

## 2023-08-07 NOTE — Patient Instructions (Addendum)
Emma Fletcher , Thank you for taking time to come for your Medicare Wellness Visit. I appreciate your ongoing commitment to your health goals. Please review the following plan we discussed and let me know if I can assist you in the future.   Referrals/Orders/Follow-Ups/Clinician Recommendations: NONE  This is a list of the screening recommended for you and due dates:  Health Maintenance  Topic Date Due   Zoster (Shingles) Vaccine (1 of 2) Never done   COVID-19 Vaccine (4 - 2024-25 season) 02/18/2023   Complete foot exam   10/31/2023   Hemoglobin A1C  02/03/2024   Eye exam for diabetics  04/23/2024   Yearly kidney function blood test for diabetes  05/06/2024   Yearly kidney health urinalysis for diabetes  05/06/2024   Medicare Annual Wellness Visit  08/06/2024   Mammogram  12/21/2024   DEXA scan (bone density measurement)  01/07/2025   Colon Cancer Screening  04/24/2028   DTaP/Tdap/Td vaccine (3 - Td or Tdap) 03/29/2029   Pneumonia Vaccine  Completed   Flu Shot  Completed   Hepatitis C Screening  Completed   HPV Vaccine  Aged Out    Advanced directives: (In Chart) A copy of your advanced directives are scanned into your chart should your provider ever need it.  Next Medicare Annual Wellness Visit scheduled for next year: Yes   08/12/24 @ 8:10 AM BY PHONE

## 2023-08-07 NOTE — Progress Notes (Signed)
Subjective:   Emma Fletcher is a 68 y.o. female who presents for Medicare Annual (Subsequent) preventive examination.  Visit Complete: Virtual I connected with  Emma Fletcher on 08/07/23 by a audio enabled telemedicine application and verified that I am speaking with the correct person using two identifiers.  This patient declined Interactive audio and Acupuncturist. Therefore the visit was completed with audio only.   Patient Location: Home  Provider Location: Office/Clinic  I discussed the limitations of evaluation and management by telemedicine. The patient expressed understanding and agreed to proceed.  Vital Signs: Because this visit was a virtual/telehealth visit, some criteria may be missing or patient reported. Any vitals not documented were not able to be obtained and vitals that have been documented are patient reported.  Cardiac Risk Factors include: advanced age (>104men, >59 women);diabetes mellitus;dyslipidemia;hypertension;sedentary lifestyle;microalbuminuria     Objective:    There were no vitals filed for this visit. There is no height or weight on file to calculate BMI.     08/07/2023    8:26 AM 07/02/2022    8:16 AM 06/07/2022   10:22 AM 11/15/2021    5:19 PM 06/06/2021    9:44 AM 05/31/2020    9:59 AM 03/31/2019   10:05 AM  Advanced Directives  Does Patient Have a Medical Advance Directive? Yes Yes Yes No Yes Yes Yes  Type of Estate agent of [place];Living will Healthcare Power of [place];Living will Healthcare Power of [place];Living will  Healthcare Power of [place];Living will Healthcare Power of [place];Living will Healthcare Power of [place];Living will  Does patient want to make changes to medical advance directive? No - Patient declined  No - Patient declined  Yes (Inpatient - patient defers changing a medical advance directive and declines information at this time)    Copy of Healthcare Power of Attorney in  Chart? Yes - validated most recent copy scanned in chart (See row information)  Yes - validated most recent copy scanned in chart (See row information)  Yes - validated most recent copy scanned in chart (See row information) Yes - validated most recent copy scanned in chart (See row information) Yes - validated most recent copy scanned in chart (See row information)    Current Medications (verified) Outpatient Encounter Medications as of 08/07/2023  Medication Sig   ascorbic acid (VITAMIN C) 500 MG tablet Take 500 mg by mouth daily.   atorvastatin (LIPITOR) 20 MG tablet Take 1 tablet (20 mg total) by mouth daily.   Blood Glucose Monitoring Suppl (ONE TOUCH ULTRA 2) w/Device KIT Use as directed to check blood sugar once daily   budesonide-formoterol (SYMBICORT) 160-4.5 MCG/ACT inhaler Inhale 2 puffs into the lungs 2 (two) times daily.   Cholecalciferol (VITAMIN D3) 125 MCG (5000 UT) CAPS Take 1 capsule (5,000 Units total) by mouth daily. TAKE 1 CAPSULE EVERY DAY   empagliflozin (JARDIANCE) 25 MG TABS tablet Take 1 tablet (25 mg total) by mouth daily before breakfast.   Ferrous Sulfate (IRON) 325 (65 Fe) MG TABS Take 2 tablets by mouth daily.   furosemide (LASIX) 20 MG tablet Take 1 tablet (20 mg total) by mouth daily.   glucose blood (ONE TOUCH ULTRA TEST) test strip Use as instructed to check blood glucose daily   lansoprazole (PREVACID) 30 MG capsule Take 1 capsule (30 mg total) by mouth daily.   levothyroxine (SYNTHROID) 100 MCG tablet Take 1 tablet (100 mcg total) by mouth daily.   lisinopril (ZESTRIL) 20 MG tablet Take 1  tablet (20 mg total) by mouth daily.   metFORMIN (GLUCOPHAGE-XR) 500 MG 24 hr tablet Take 2 tablets (1,000 mg total) by mouth daily with breakfast AND 1 tablet (500 mg total) at bedtime.   Multiple Vitamins-Minerals (ONE-A-DAY WOMENS 50 PLUS PO) Take 1 tablet by mouth daily.   OneTouch Delica Lancets 33G MISC Use as directed to check blood glucose daily   polyethylene glycol  powder (GLYCOLAX/MIRALAX) 17 GM/SCOOP powder Please use one cup of miralax daily.   Semaglutide, 1 MG/DOSE, (OZEMPIC, 1 MG/DOSE,) 4 MG/3ML SOPN INJECT 1 MG UNDER THE SKIN ONE TIME WEEKLY AS DIRECTED   No facility-administered encounter medications on file as of 08/07/2023.    Allergies (verified) Patient has no known allergies.   History: Past Medical History:  Diagnosis Date   Anemia    Bronchitis    Cardiomyopathy secondary    Developmental delay    Diabetes mellitus without complication (HCC)    Edema    Hypercholesterolemia    Unspecified essential hypertension    Past Surgical History:  Procedure Laterality Date   COLONOSCOPY     COLONOSCOPY WITH PROPOFOL N/A 04/24/2018   Procedure: COLONOSCOPY WITH PROPOFOL;  Surgeon: Wyline Mood, MD;  Location: [facility] ENDOSCOPY;  Service: Gastroenterology;  Laterality: N/A;   none     Family History  Problem Relation Age of Onset   Diabetes Mother    Hypertension Mother    Stroke Mother    Diabetes Father    Coronary artery disease Father    Breast cancer Maternal Aunt    Lung cancer Maternal Uncle    Prostate cancer Paternal Uncle    Uterine cancer Other    Social History   Socioeconomic History   Marital status: Single    Spouse name: Not on file   Number of children: 0   Years of education: Not on file   Highest education level: 12th grade  Occupational History   Occupation: Part time works in Musician  Tobacco Use   Smoking status: Never   Smokeless tobacco: Never  Vaping Use   Vaping status: Never Used  Substance and Sexual Activity   Alcohol use: No    Alcohol/week: 0.0 standard drinks of alcohol   Drug use: No   Sexual activity: Not on file  Other Topics Concern   Not on file  Social History Narrative   Not on file   Social Drivers of Health   Financial Resource Strain: Low Risk  (08/07/2023)   Overall Financial Resource Strain (CARDIA)    Difficulty of Paying Living Expenses: Not hard at all  Food  Insecurity: No Food Insecurity (08/07/2023)   Hunger Vital Sign    Worried About Running Out of Food in the Last Year: Never true    Ran Out of Food in the Last Year: Never true  Transportation Needs: No Transportation Needs (08/07/2023)   PRAPARE - Administrator, Civil Service (Medical): No    Lack of Transportation (Non-Medical): No  Physical Activity: Insufficiently Active (08/07/2023)   Exercise Vital Sign    Days of Exercise per Week: 3 days    Minutes of Exercise per Session: 30 min  Stress: No Stress Concern Present (08/07/2023)   [employer] of Occupational Health - Occupational Stress Questionnaire    Feeling of Stress : Not at all  Social Connections: Moderately Isolated (08/07/2023)   Social Connection and Isolation Panel [NHANES]    Frequency of Communication with Friends and Family: More than three times a  week    Frequency of Social Gatherings with Friends and Family: Twice a week    Attends Religious Services: More than 4 times per year    Active Member of [employer] or Organizations: No    Attends Engineer, structural: Never    Marital Status: Never married    Tobacco Counseling Counseling given: Not Answered   Clinical Intake:  Pre-visit preparation completed: Yes  Pain : No/denies pain     BMI - recorded: 27.5 Nutritional Status: BMI 25 -29 Overweight Nutritional Risks: None Diabetes: Yes CBG done?: No Did pt. bring in CBG monitor from home?: No  How often do you need to have someone help you when you read instructions, pamphlets, or other written materials from your doctor or pharmacy?: 1 - Never  Interpreter Needed?: No  Information entered by :: Kennedy Bucker, LPN   Activities of Daily Living    08/07/2023    8:27 AM 10/31/2022    8:16 AM  In your present state of health, do you have any difficulty performing the following activities:  Hearing? 1 1  Vision? 0 1  Difficulty concentrating or making decisions? 0 0  Walking  or climbing stairs? 0 0  Dressing or bathing? 0 0  Doing errands, shopping? 1 1  Preparing Food and eating ? N   Using the Toilet? N   In the past six months, have you accidently leaked urine? N   Do you have problems with loss of bowel control? N   Managing your Medications? Y   Managing your Finances? Y   Housekeeping or managing your Housekeeping? Y     Patient Care Team: Erasmo Downer, MD as PCP - General (Family Medicine) Helane Gunther, DPM as Consulting Physician (Podiatry) Pasty Spillers, MD (Inactive) as Consulting Physician (Gastroenterology) [facility], [facility] (Optometry)  Indicate any recent Medical Services you may have received from other than [facility] providers in the past year (date may be approximate).     Assessment:   This is a routine wellness examination for Moodys.  Hearing/Vision screen Hearing Screening - Comments:: WEARS AIDS, BOTH EARS Vision Screening - Comments:: WEARS GLASSES ALL THE TIME- [facility]   Goals Addressed             This Visit's Progress    Cut out extra servings         Depression Screen    08/07/2023    8:24 AM 02/05/2023    8:26 AM 10/31/2022    8:15 AM 06/07/2022   10:20 AM 04/24/2022    8:26 AM 11/22/2021   10:49 AM 10/17/2021    8:24 AM  PHQ 2/9 Scores  PHQ - 2 Score 0 0 0 1 0 0 0  PHQ- 9 Score 0  0 1 0 0     Fall Risk    08/07/2023    8:26 AM 02/05/2023    8:26 AM 10/31/2022    8:15 AM 06/07/2022   10:22 AM 04/24/2022    8:26 AM  Fall Risk   Falls in the past year? 1 0 0 1 1  Number falls in past yr: 1 0 0 0 0  Injury with Fall? 0 0 0 0 0  Risk for fall due to : History of fall(s) No Fall Risks No Fall Risks History of fall(s) No Fall Risks  Follow up Falls evaluation completed;Falls prevention discussed Falls evaluation completed Falls evaluation completed Falls evaluation completed;Falls prevention discussed Falls evaluation completed  MEDICARE RISK AT HOME: Medicare Risk at Home Any stairs  in or around the home?: Yes If so, are there any without handrails?: No Home free of loose throw rugs in walkways, pet beds, electrical cords, etc?: Yes Adequate lighting in your home to reduce risk of falls?: Yes Life alert?: No Use of a cane, walker or w/c?: No Grab bars in the bathroom?: Yes Shower chair or bench in shower?: Yes Elevated toilet seat or a handicapped toilet?: Yes  TIMED UP AND GO:  Was the test performed?  No    Cognitive Function:        08/07/2023    8:28 AM 05/31/2020   10:03 AM 03/31/2019   10:11 AM  6CIT Screen  What Year? 0 points 0 points 0 points  What month? 3 points 3 points 0 points  What time? 0 points 0 points 0 points  Count back from 20 0 points 0 points 0 points  Months in reverse 4 points 4 points 0 points  Repeat phrase 10 points 4 points 2 points  Total Score 17 points 11 points 2 points    Immunizations Immunization History  Administered Date(s) Administered   Fluad Quad(high Dose 65+) 04/24/2022, 02/05/2023   Influenza Inj Mdck Quad Pf 03/28/2020   Influenza,inj,Quad PF,6+ Mos 03/26/2018, 04/05/2021   Influenza-Unspecified 03/30/2019, 03/19/2020   Moderna Covid-19 Fall Seasonal Vaccine 73yrs & older 06/19/2022   Moderna Sars-Covid-2 Vaccination 09/17/2019, 10/15/2019   PNEUMOCOCCAL CONJUGATE-20 04/24/2022   Pneumococcal Polysaccharide-23 03/26/2018   Tdap 10/20/2010, 03/30/2019    TDAP status: Up to date  Flu Vaccine status: Up to date  Pneumococcal vaccine status: Up to date  Covid-19 vaccine status: Completed vaccines  Qualifies for Shingles Vaccine? Yes   Zostavax completed No   Shingrix Completed?: No.    Education has been provided regarding the importance of this vaccine. Patient has been advised to call insurance company to determine out of pocket expense if they have not yet received this vaccine. Advised may also receive vaccine at local pharmacy or Health Dept. Verbalized acceptance and  understanding.  Screening Tests Health Maintenance  Topic Date Due   Zoster Vaccines- Shingrix (1 of 2) Never done   COVID-19 Vaccine (4 - 2024-25 season) 02/18/2023   FOOT EXAM  10/31/2023   HEMOGLOBIN A1C  02/03/2024   OPHTHALMOLOGY EXAM  04/23/2024   Diabetic kidney evaluation - eGFR measurement  05/06/2024   Diabetic kidney evaluation - Urine ACR  05/06/2024   Medicare Annual Wellness (AWV)  08/06/2024   MAMMOGRAM  12/21/2024   DEXA SCAN  01/07/2025   Colonoscopy  04/24/2028   DTaP/Tdap/Td (3 - Td or Tdap) 03/29/2029   Pneumonia Vaccine 41+ Years old  Completed   INFLUENZA VACCINE  Completed   Hepatitis C Screening  Completed   HPV VACCINES  Aged Out    Health Maintenance  Health Maintenance Due  Topic Date Due   Zoster Vaccines- Shingrix (1 of 2) Never done   COVID-19 Vaccine (4 - 2024-25 season) 02/18/2023    Colorectal cancer screening: Type of screening: Colonoscopy. Completed 04/24/18. Repeat every 10 years  Mammogram status: Completed 12/22/22. Repeat every year  Bone Density status: Completed 01/08/23. Results reflect: Bone density results: OSTEOPENIA. Repeat every 5 years.  Lung Cancer Screening: (Low Dose CT Chest recommended if Age 67-80 years, 20 pack-year currently smoking OR have quit w/in 15years.) does not qualify.    Additional Screening:  Hepatitis C Screening: does qualify; Completed 09/25/17  Vision Screening: Recommended annual  ophthalmology exams for early detection of glaucoma and other disorders of the eye. Is the patient up to date with their annual eye exam?  Yes  Who is the provider or what is the name of the office in which the patient attends annual eye exams? [facility] If pt is not established with a provider, would they like to be referred to a provider to establish care? No .   Dental Screening: Recommended annual dental exams for proper oral hygiene  Diabetic Foot Exam: Diabetic Foot Exam: Completed 10/31/22  Community Resource  Referral / Chronic Care Management: CRR required this visit?  No   CCM required this visit?  No     Plan:     I have personally reviewed and noted the following in the patient's chart:   Medical and social history Use of alcohol, tobacco or illicit drugs  Current medications and supplements including opioid prescriptions. Patient is not currently taking opioid prescriptions. Functional ability and status Nutritional status Physical activity Advanced directives List of other physicians Hospitalizations, surgeries, and ER visits in previous 12 months Vitals Screenings to include cognitive, depression, and falls Referrals and appointments  In addition, I have reviewed and discussed with patient certain preventive protocols, quality metrics, and best practice recommendations. A written personalized care plan for preventive services as well as general preventive health recommendations were provided to patient.     Hal Hope, LPN   [id number]   After Visit Summary: (MyChart) Due to this being a telephonic visit, the after visit summary with patients personalized plan was offered to patient via MyChart   Nurse Notes: NONE

## 2023-08-08 ENCOUNTER — Ambulatory Visit: Payer: Medicare Other | Admitting: Family Medicine

## 2023-08-08 ENCOUNTER — Encounter: Payer: Self-pay | Admitting: Family Medicine

## 2023-08-08 VITALS — BP 119/75 | HR 91 | Resp 16 | Ht 65.0 in | Wt 160.0 lb

## 2023-08-08 DIAGNOSIS — R300 Dysuria: Secondary | ICD-10-CM | POA: Diagnosis not present

## 2023-08-08 DIAGNOSIS — Z7984 Long term (current) use of oral hypoglycemic drugs: Secondary | ICD-10-CM

## 2023-08-08 DIAGNOSIS — E1165 Type 2 diabetes mellitus with hyperglycemia: Secondary | ICD-10-CM | POA: Diagnosis not present

## 2023-08-08 LAB — POCT URINALYSIS DIPSTICK
Bilirubin, UA: NEGATIVE
Blood, UA: NEGATIVE
Glucose, UA: NEGATIVE
Ketones, UA: 80
Leukocytes, UA: NEGATIVE
Nitrite, UA: NEGATIVE
Odor: POSITIVE
Protein, UA: NEGATIVE
Spec Grav, UA: 1.01 (ref 1.010–1.025)
Urobilinogen, UA: 0.2 U/dL
pH, UA: 6 (ref 5.0–8.0)

## 2023-08-08 MED ORDER — CEFDINIR 300 MG PO CAPS
300.0000 mg | ORAL_CAPSULE | Freq: Two times a day (BID) | ORAL | 0 refills | Status: AC
Start: 2023-08-08 — End: 2023-08-18

## 2023-08-08 MED ORDER — GLIPIZIDE 5 MG PO TABS
5.0000 mg | ORAL_TABLET | Freq: Two times a day (BID) | ORAL | 1 refills | Status: DC
Start: 2023-08-08 — End: 2023-10-02

## 2023-08-08 NOTE — Progress Notes (Signed)
Established patient visit   Patient: Emma Fletcher   DOB: 1955/08/15   68 y.o. Female  MRN: [id number] Visit Date: 08/08/2023  Today's healthcare provider: Mila Merry, MD   Chief Complaint  Patient presents with   Blood Sugar Problem    Patient sister concern of high sugar levels. Reports sugar level was 480 this morning.   Dysuria   Subjective    Discussed the use of AI scribe software for clinical note transcription with the patient, who gave verbal consent to proceed.  History of Present Illness   Emma Fletcher is a 68 year old female with diabetes who presents with elevated blood sugar levels and respiratory symptoms. She is accompanied by her sister. She was recently seen by Dr. B and surprisingly found A1c to have risen to over 15. She has been experiencing elevated blood sugar levels, with readings reaching up to 480 mg/dL over the past three weeks.  She missed one dose of Ozempic due to a mail issue but continues to take metformin and Jardiance. Her sister notes adherence to her diet, with occasional deviations.  She has respiratory symptoms, including a cough that began around Wednesday or Thursday of last week. Initially, she coughed up clear mucus but now produces green mucus. She takes Mucinex DM for sinus congestion. Her throat burns, and her nose is stuffy and runny. No wheezing is present.  Her ears are clogged, and she uses OTC drops for ear wax buildup. Her sister mentions soreness in the right ear, though no wax is present.     Lab Results  Component Value Date   HGBA1C >15.5 (H) 08/06/2023   Last metabolic panel Lab Results  Component Value Date   GLUCOSE 148 (H) 05/07/2023   NA 138 05/07/2023   K 4.8 05/07/2023   CL 100 05/07/2023   CO2 19 (L) 05/07/2023   BUN 22 05/07/2023   CREATININE 0.84 05/07/2023   EGFR 77 05/07/2023   CALCIUM 10.4 (H) 05/07/2023   PROT 7.4 05/07/2023   ALBUMIN 4.6 05/07/2023   LABGLOB 2.8 05/07/2023   AGRATIO 1.8  10/31/2022   BILITOT 0.3 05/07/2023   ALKPHOS 151 (H) 05/07/2023   AST 12 05/07/2023   ALT 12 05/07/2023   ANIONGAP 12 02/13/2019     Medications: Outpatient Medications Prior to Visit  Medication Sig   ascorbic acid (VITAMIN C) 500 MG tablet Take 500 mg by mouth daily.   atorvastatin (LIPITOR) 20 MG tablet Take 1 tablet (20 mg total) by mouth daily.   Blood Glucose Monitoring Suppl (ONE TOUCH ULTRA 2) w/Device KIT Use as directed to check blood sugar once daily   budesonide-formoterol (SYMBICORT) 160-4.5 MCG/ACT inhaler Inhale 2 puffs into the lungs 2 (two) times daily.   Cholecalciferol (VITAMIN D3) 125 MCG (5000 UT) CAPS Take 1 capsule (5,000 Units total) by mouth daily. TAKE 1 CAPSULE EVERY DAY   empagliflozin (JARDIANCE) 25 MG TABS tablet Take 1 tablet (25 mg total) by mouth daily before breakfast.   Ferrous Sulfate (IRON) 325 (65 Fe) MG TABS Take 2 tablets by mouth daily.   furosemide (LASIX) 20 MG tablet Take 1 tablet (20 mg total) by mouth daily.   glucose blood (ONE TOUCH ULTRA TEST) test strip Use as instructed to check blood glucose daily   lansoprazole (PREVACID) 30 MG capsule Take 1 capsule (30 mg total) by mouth daily.   levothyroxine (SYNTHROID) 100 MCG tablet Take 1 tablet (100 mcg total) by mouth daily.  lisinopril (ZESTRIL) 20 MG tablet Take 1 tablet (20 mg total) by mouth daily.   metFORMIN (GLUCOPHAGE-XR) 500 MG 24 hr tablet Take 2 tablets (1,000 mg total) by mouth daily with breakfast AND 1 tablet (500 mg total) at bedtime.   Multiple Vitamins-Minerals (ONE-A-DAY WOMENS 50 PLUS PO) Take 1 tablet by mouth daily.   OneTouch Delica Lancets 33G MISC Use as directed to check blood glucose daily   polyethylene glycol powder (GLYCOLAX/MIRALAX) 17 GM/SCOOP powder Please use one cup of miralax daily.   Semaglutide, 1 MG/DOSE, (OZEMPIC, 1 MG/DOSE,) 4 MG/3ML SOPN INJECT 1 MG UNDER THE SKIN ONE TIME WEEKLY AS DIRECTED   Semaglutide, 2 MG/DOSE, 8 MG/3ML SOPN Inject 2 mg as  directed once a week.   No facility-administered medications prior to visit.   Review of Systems     Objective    BP 119/75 (BP Location: Left Arm, Patient Position: Sitting, Cuff Size: Normal)   Pulse 91   Resp 16   Ht 5\' 5"  (1.651 m)   Wt 160 lb (72.6 kg)   LMP  (LMP Unknown)   BMI 26.63 kg/m    Physical Exam   HEENT: Right ear normal. Cerumen present in left ear. CHEST: Lungs clear to auscultation bilaterally.     Results for orders placed or performed in visit on 08/08/23  POCT urinalysis dipstick  Result Value Ref Range   Color, UA Light Yellow    Clarity, UA Clear    Glucose, UA Negative Negative   Bilirubin, UA Negative    Ketones, UA 80    Spec Grav, UA 1.010 1.010 - 1.025   Blood, UA Negative    pH, UA 6.0 5.0 - 8.0   Protein, UA Negative Negative   Urobilinogen, UA 0.2 0.2 or 1.0 E.U./dL   Nitrite, UA Negative    Leukocytes, UA Negative Negative   Appearance     Odor Positive     Assessment & Plan        Uncontrolled Diabetes Mellitus Blood glucose levels significantly elevated (480 mg/dL) for the past three weeks. Possible contributing factors include a potential infection and inconsistent medication adherence. -Start Glipizide 5mg  before breakfast and dinner to rapidly lower blood glucose levels. -Continue Ozempic, Metformin and Jardiance as prescribed. -Check blood glucose levels regularly and monitor for hypoglycemia. Hold glipizide if home blood sugars normalize  Upper Respiratory Infection/ Sinusitis Symptoms of sinus congestion, cough, and green sputum production. No signs of bronchitis on lung auscultation. -Start Cefdinir for suspected sinus infection. -Continue Mucinex DM as prescribed.  Impacted Cerumen Left ear discomfort and impacted cerumen noted on examination. -Continue OTC ear drops and consider one more application to dissolve remaining wax.  Follow-up appointment Elevated A1c and need for closer monitoring of diabetes  control. -Schedule follow-up appointment with Dr. Leonard Schwartz in mid-March.     Future Appointments  Date Time Provider Department Center  08/27/2023  9:00 AM Erasmo Downer, MD [facility]  09/24/2023  2:15 PM Freddie Breech, DPM [facility]  10/02/2023  8:00 AM Erasmo Downer, MD [facility]  11/05/2023  8:40 AM Beryle Flock Marzella Schlein, MD [facility]  08/12/2024  8:10 AM [facility]NUAL WELLNESS VISIT [facility]        Mila Merry, MD  [facility] [phone] (phone) [phone] (fax)  [facility]

## 2023-08-08 NOTE — Patient Instructions (Signed)
 Marland Kitchen  Please review the attached list of medications and notify my office if there are any errors.   . Please bring all of your medications to every appointment so we can make sure that our medication list is the same as yours.

## 2023-08-15 DIAGNOSIS — H6122 Impacted cerumen, left ear: Secondary | ICD-10-CM | POA: Diagnosis not present

## 2023-08-15 DIAGNOSIS — H6983 Other specified disorders of Eustachian tube, bilateral: Secondary | ICD-10-CM | POA: Diagnosis not present

## 2023-08-27 ENCOUNTER — Ambulatory Visit (INDEPENDENT_AMBULATORY_CARE_PROVIDER_SITE_OTHER): Payer: Medicare Other | Admitting: Family Medicine

## 2023-08-27 ENCOUNTER — Encounter: Payer: Self-pay | Admitting: Family Medicine

## 2023-08-27 VITALS — BP 110/69 | HR 70 | Ht 65.0 in | Wt 164.7 lb

## 2023-08-27 DIAGNOSIS — E039 Hypothyroidism, unspecified: Secondary | ICD-10-CM | POA: Diagnosis not present

## 2023-08-27 DIAGNOSIS — N182 Chronic kidney disease, stage 2 (mild): Secondary | ICD-10-CM

## 2023-08-27 DIAGNOSIS — E1159 Type 2 diabetes mellitus with other circulatory complications: Secondary | ICD-10-CM | POA: Diagnosis not present

## 2023-08-27 DIAGNOSIS — E785 Hyperlipidemia, unspecified: Secondary | ICD-10-CM

## 2023-08-27 DIAGNOSIS — E1169 Type 2 diabetes mellitus with other specified complication: Secondary | ICD-10-CM

## 2023-08-27 DIAGNOSIS — E1122 Type 2 diabetes mellitus with diabetic chronic kidney disease: Secondary | ICD-10-CM | POA: Diagnosis not present

## 2023-08-27 DIAGNOSIS — I152 Hypertension secondary to endocrine disorders: Secondary | ICD-10-CM | POA: Diagnosis not present

## 2023-08-27 NOTE — Assessment & Plan Note (Signed)
 CKD2 with microalbuminuria. - Continue monitoring kidney function and manage comorbid conditions.

## 2023-08-27 NOTE — Assessment & Plan Note (Signed)
 68 year old female with type 2 diabetes mellitus. Recent A1c elevated at 15%, indicating poor glycemic control. Blood glucose levels have been high, with recent readings around 201 mg/dL, previously as high as 400-500 mg/dL. Recently started on glipizide 5 mg BID with meals, in addition to Ozempic 2 mg weekly, metformin XR 1000 mg with breakfast and 500 mg at bedtime, and Jardiance 25 mg daily. Monitoring shows some improvement. Discussed glipizide's role in increasing insulin production and potential short-term benefits. Declined continuous glucose monitor and additional pharmacist support. Emphasized diet and exercise importance. - Continue Ozempic 2 mg weekly, metformin XR 1000 mg with breakfast and 500 mg at bedtime, glipizide 5 mg BID with meals, and Jardiance 25 mg daily. - Monitor blood glucose levels at different times of the day and maintain a log. - Follow up on October 02, 2023, to reassess blood glucose levels and overall diabetes management.

## 2023-08-27 NOTE — Progress Notes (Signed)
 Established patient visit   Patient: Emma Fletcher   DOB: August 05, 1955   68 y.o. Female  MRN: [id number] Visit Date: 08/27/2023  Today's healthcare provider: Shirlee Latch, MD   Chief Complaint  Patient presents with   Follow-up    2 week follow-up   Diabetes    Patient was having  significantly elevated (480 mg/dL) for the past three weeks when seen in office on 08/08/23. Patient was to start Glipizide 5mg  before breakfast and dinner to rapidly lower blood glucose levels and continue Ozempic, Metformin and Jardiance as prescribed. Pt report staking as prescribed and blood sugar have come down to be in the 200's. Has record available today in office    Subjective    Diabetes   HPI     Follow-up    Additional comments: 2 week follow-up        Diabetes    Additional comments: Patient was having  significantly elevated (480 mg/dL) for the past three weeks when seen in office on 08/08/23. Patient was to start Glipizide 5mg  before breakfast and dinner to rapidly lower blood glucose levels and continue Ozempic, Metformin and Jardiance as prescribed. Pt report staking as prescribed and blood sugar have come down to be in the 200's. Has record available today in office       Last edited by Acey Lav, CMA on 08/27/2023  9:16 AM.       Discussed the use of AI scribe software for clinical note transcription with the patient, who gave verbal consent to proceed.  History of Present Illness   A 68 year old patient with a history of type 2 diabetes, hypothyroidism, hypertension, hyperlipidemia, microalbuminuria, and CKD2 presents with concerns about high blood sugar levels. The patient's caregiver reports that the patient's blood sugar levels were previously uncontrolled, reaching an A1C of 15. The patient was unaware of the severity of the hyperglycemia as the caregiver was initially not truthful about the readings. The patient's blood sugar levels have been decreasing  since the initiation of glipizide and an increased dose of Ozempic. The patient's caregiver has been monitoring the patient's diet closely, limiting sugar intake, and ensuring medication compliance. The patient also reports a recent infection, which has been treated with a 10-day course of antibiotics. The patient has been experiencing nasal congestion, which has improved with the use of Flonase.        Medications: Outpatient Medications Prior to Visit  Medication Sig   ascorbic acid (VITAMIN C) 500 MG tablet Take 500 mg by mouth daily.   atorvastatin (LIPITOR) 20 MG tablet Take 1 tablet (20 mg total) by mouth daily.   Blood Glucose Monitoring Suppl (ONE TOUCH ULTRA 2) w/Device KIT Use as directed to check blood sugar once daily   budesonide-formoterol (SYMBICORT) 160-4.5 MCG/ACT inhaler Inhale 2 puffs into the lungs 2 (two) times daily.   Cholecalciferol (VITAMIN D3) 125 MCG (5000 UT) CAPS Take 1 capsule (5,000 Units total) by mouth daily. TAKE 1 CAPSULE EVERY DAY   empagliflozin (JARDIANCE) 25 MG TABS tablet Take 1 tablet (25 mg total) by mouth daily before breakfast.   Ferrous Sulfate (IRON) 325 (65 Fe) MG TABS Take 2 tablets by mouth daily.   furosemide (LASIX) 20 MG tablet Take 1 tablet (20 mg total) by mouth daily.   glipiZIDE (GLUCOTROL) 5 MG tablet Take 1 tablet (5 mg total) by mouth 2 (two) times daily before a meal.   glucose blood (ONE TOUCH ULTRA TEST) test strip Use as  instructed to check blood glucose daily   lansoprazole (PREVACID) 30 MG capsule Take 1 capsule (30 mg total) by mouth daily.   levothyroxine (SYNTHROID) 100 MCG tablet Take 1 tablet (100 mcg total) by mouth daily.   lisinopril (ZESTRIL) 20 MG tablet Take 1 tablet (20 mg total) by mouth daily.   metFORMIN (GLUCOPHAGE-XR) 500 MG 24 hr tablet Take 2 tablets (1,000 mg total) by mouth daily with breakfast AND 1 tablet (500 mg total) at bedtime.   Multiple Vitamins-Minerals (ONE-A-DAY WOMENS 50 PLUS PO) Take 1 tablet by  mouth daily.   OneTouch Delica Lancets 33G MISC Use as directed to check blood glucose daily   polyethylene glycol powder (GLYCOLAX/MIRALAX) 17 GM/SCOOP powder Please use one cup of miralax daily.   Semaglutide, 2 MG/DOSE, 8 MG/3ML SOPN Inject 2 mg as directed once a week.   [DISCONTINUED] Semaglutide, 1 MG/DOSE, (OZEMPIC, 1 MG/DOSE,) 4 MG/3ML SOPN INJECT 1 MG UNDER THE SKIN ONE TIME WEEKLY AS DIRECTED   No facility-administered medications prior to visit.    Review of Systems     Objective    BP 110/69 (BP Location: Left Arm, Patient Position: Sitting, Cuff Size: Large)   Pulse 70   Ht 5\' 5"  (1.651 m)   Wt 164 lb 11.2 oz (74.7 kg)   LMP  (LMP Unknown)   SpO2 100%   BMI 27.41 kg/m    Physical Exam Vitals reviewed.  Constitutional:      General: She is not in acute distress.    Appearance: Normal appearance. She is well-developed. She is not diaphoretic.  HENT:     Head: Normocephalic and atraumatic.  Eyes:     General: No scleral icterus.    Conjunctiva/sclera: Conjunctivae normal.  Neck:     Thyroid: No thyromegaly.  Cardiovascular:     Rate and Rhythm: Normal rate and regular rhythm.     Heart sounds: Normal heart sounds. No murmur heard. Pulmonary:     Effort: Pulmonary effort is normal. No respiratory distress.     Breath sounds: Normal breath sounds. No wheezing, rhonchi or rales.  Musculoskeletal:     Cervical back: Neck supple.     Right lower leg: No edema.     Left lower leg: No edema.  Lymphadenopathy:     Cervical: No cervical adenopathy.  Skin:    General: Skin is warm and dry.     Findings: No rash.  Neurological:     Mental Status: She is alert and oriented to person, place, and time. Mental status is at baseline.  Psychiatric:        Mood and Affect: Mood normal.        Behavior: Behavior normal.      No results found for any visits on 08/27/23.  Assessment & Plan     Problem List Items Addressed This Visit       Cardiovascular and  Mediastinum   Hypertension associated with diabetes (HCC)   Hypertension managed with lisinopril 20 mg daily. - Continue lisinopril 20 mg daily.        Endocrine   T2DM (type 2 diabetes mellitus) (HCC) - Primary   68 year old female with type 2 diabetes mellitus. Recent A1c elevated at 15%, indicating poor glycemic control. Blood glucose levels have been high, with recent readings around 201 mg/dL, previously as high as 400-500 mg/dL. Recently started on glipizide 5 mg BID with meals, in addition to Ozempic 2 mg weekly, metformin XR 1000 mg with breakfast and 500 mg  at bedtime, and Jardiance 25 mg daily. Monitoring shows some improvement. Discussed glipizide's role in increasing insulin production and potential short-term benefits. Declined continuous glucose monitor and additional pharmacist support. Emphasized diet and exercise importance. - Continue Ozempic 2 mg weekly, metformin XR 1000 mg with breakfast and 500 mg at bedtime, glipizide 5 mg BID with meals, and Jardiance 25 mg daily. - Monitor blood glucose levels at different times of the day and maintain a log. - Follow up on October 02, 2023, to reassess blood glucose levels and overall diabetes management.      Hyperlipidemia associated with type 2 diabetes mellitus (HCC)   Hyperlipidemia managed with atorvastatin 20 mg daily. - Continue atorvastatin 20 mg daily.      Hypothyroidism   Hypothyroidism managed with Synthroid 100 mcg daily. - Continue Synthroid 100 mcg daily.        Genitourinary   CKD (chronic kidney disease), stage II   CKD2 with microalbuminuria. - Continue monitoring kidney function and manage comorbid conditions.           Allergic Rhinitis Nasal congestion managed with Flonase daily. Advised to continue nasal spray, especially with the upcoming allergy season. Consider additional allergy medication if symptoms worsen. - Continue Flonase daily. - Consider allergy medication if symptoms  worsen.  Follow-up - Follow up on October 02, 2023.        Return for as scheduled.       Shirlee Latch, MD  [facility] [phone] (phone) [phone] (fax)  [facility]

## 2023-08-27 NOTE — Assessment & Plan Note (Signed)
 Hyperlipidemia managed with atorvastatin 20 mg daily. - Continue atorvastatin 20 mg daily.

## 2023-08-27 NOTE — Assessment & Plan Note (Signed)
 Hypertension managed with lisinopril 20 mg daily. - Continue lisinopril 20 mg daily.

## 2023-08-27 NOTE — Assessment & Plan Note (Signed)
 Hypothyroidism managed with Synthroid 100 mcg daily. - Continue Synthroid 100 mcg daily.

## 2023-09-24 ENCOUNTER — Encounter: Payer: Self-pay | Admitting: Podiatry

## 2023-09-24 ENCOUNTER — Ambulatory Visit (INDEPENDENT_AMBULATORY_CARE_PROVIDER_SITE_OTHER): Payer: Medicare Other | Admitting: Podiatry

## 2023-09-24 DIAGNOSIS — Q828 Other specified congenital malformations of skin: Secondary | ICD-10-CM

## 2023-09-24 DIAGNOSIS — B351 Tinea unguium: Secondary | ICD-10-CM | POA: Diagnosis not present

## 2023-09-24 DIAGNOSIS — N182 Chronic kidney disease, stage 2 (mild): Secondary | ICD-10-CM

## 2023-09-24 DIAGNOSIS — E1122 Type 2 diabetes mellitus with diabetic chronic kidney disease: Secondary | ICD-10-CM | POA: Diagnosis not present

## 2023-09-24 DIAGNOSIS — M79676 Pain in unspecified toe(s): Secondary | ICD-10-CM | POA: Diagnosis not present

## 2023-09-24 NOTE — Progress Notes (Signed)
 Subjective:  Patient ID: Emma Fletcher, female    DOB: [birth year],  MRN: [id number]  68 y.o. female presents at risk foot care. Pt has h/o NIDDM with chronic kidney disease and painful, elongated thickened toenails x 10 which are symptomatic when wearing enclosed shoe gear. This interferes with his/her daily activities. She states her right great toe is sore at the lateral border as well as painful lesion on the outside of her right foot. Chief Complaint  Patient presents with   Diabetes    "Cut my toenails.  Sometime my feet itch.  The corner of my right big toe is sore." Dr. Beryle Flock - 08/27/2023; A1c - 15.5   New problem(s): None   PCP is Bacigalupo, Marzella Schlein, MD.  No Known Allergies  Review of Systems: Negative except as noted in the HPI.   Objective:  Emma Fletcher is a pleasant 68 y.o. female WD, WN in NAD. AAO x 3.  Vascular Examination: Vascular status intact b/l with palpable pedal pulses. CFT immediate b/l. Pedal hair present. No edema. No pain with calf compression b/l. Skin temperature gradient WNL b/l. No varicosities noted. No cyanosis or clubbing noted.  Neurological Examination: Sensation grossly intact b/l with 10 gram monofilament. Vibratory sensation intact b/l.  Dermatological Examination: Pedal skin with normal turgor, texture and tone b/l. No open wounds nor interdigital macerations noted. No pedal scaling or peeling noted b/l. Toenails 1-5 b/l thick, discolored, elongated with subungual debris and pain on dorsal palpation. Porokeratotic lesion(s) lateral aspect 5th met base right foot. No erythema, no edema, no drainage, no fluctuance.  Musculoskeletal Examination: Muscle strength 5/5 to b/l LE.  No pain, crepitus noted b/l. No gross pedal deformities. Patient ambulates independently without assistive aids.   Radiographs: None  Last A1c:      Latest Ref Rng & Units 08/06/2023    8:57 AM 08/06/2023    8:49 AM 05/07/2023    9:00 AM 02/05/2023    8:19  AM 10/31/2022    8:19 AM  Hemoglobin A1C  Hemoglobin-A1c 4.8 - 5.6 % >15.5  15.0  7.8  7.2  7.7      Assessment:   1. Pain due to onychomycosis of toenail   2. Porokeratosis   3. Type 2 diabetes mellitus with stage 2 chronic kidney disease, without long-term current use of insulin (HCC)    Plan:  Consent given for treatment. Patient examined. All patient's and/or POA's questions/concerns addressed on today's visit. No evidence of tinea pedis noted today, but her A1c is elevated which may be contributing to some nerve symptoms. Mycotic toenails 1-5 debrided in length and girth without incident. Porokeratotic lesion(s) 5th met base right foot pared and enucleated with sharp debridement without incident. Continue daily foot inspections and monitor blood glucose per PCP/Endocrinologist's recommendations. Continue soft, supportive shoe gear daily. Report any pedal injuries to medical professional. Call office if there are any quesitons/concerns. -Patient/POA to call should there be question/concern in the interim.  Return in about 3 months (around 12/24/2023).  Freddie Breech, DPM      [place] LOCATION[address] [place], [place] [address]  Office [phone]   [facility] LOCATION: [address] [place], [place] [address] Office [phone]

## 2023-09-26 ENCOUNTER — Other Ambulatory Visit: Payer: Self-pay | Admitting: Family Medicine

## 2023-09-27 ENCOUNTER — Telehealth: Payer: Self-pay | Admitting: Family Medicine

## 2023-09-27 DIAGNOSIS — E785 Hyperlipidemia, unspecified: Secondary | ICD-10-CM

## 2023-09-27 MED ORDER — ATORVASTATIN CALCIUM 20 MG PO TABS: 20.0000 mg | ORAL_TABLET | Freq: Every day | ORAL | 1 refills | Status: DC

## 2023-09-27 NOTE — Addendum Note (Signed)
 Addended by: Lily Kocher on: 09/27/2023 10:27 AM   Modules accepted: Orders

## 2023-09-27 NOTE — Telephone Encounter (Signed)
 [facility] is requesting refill atorvastatin (LIPITOR) 20 MG tablet   Please advise

## 2023-09-28 ENCOUNTER — Telehealth: Payer: Self-pay | Admitting: Family Medicine

## 2023-09-28 NOTE — Telephone Encounter (Signed)
 Opened in error

## 2023-10-02 ENCOUNTER — Encounter: Payer: Self-pay | Admitting: Family Medicine

## 2023-10-02 ENCOUNTER — Telehealth: Payer: Self-pay | Admitting: Family Medicine

## 2023-10-02 ENCOUNTER — Ambulatory Visit: Payer: Medicare Other | Admitting: Family Medicine

## 2023-10-02 VITALS — BP 108/68 | HR 79 | Ht 65.0 in | Wt 160.7 lb

## 2023-10-02 DIAGNOSIS — Z7985 Long-term (current) use of injectable non-insulin antidiabetic drugs: Secondary | ICD-10-CM

## 2023-10-02 DIAGNOSIS — I152 Hypertension secondary to endocrine disorders: Secondary | ICD-10-CM

## 2023-10-02 DIAGNOSIS — E1159 Type 2 diabetes mellitus with other circulatory complications: Secondary | ICD-10-CM

## 2023-10-02 DIAGNOSIS — J449 Chronic obstructive pulmonary disease, unspecified: Secondary | ICD-10-CM

## 2023-10-02 DIAGNOSIS — N182 Chronic kidney disease, stage 2 (mild): Secondary | ICD-10-CM | POA: Diagnosis not present

## 2023-10-02 DIAGNOSIS — E1122 Type 2 diabetes mellitus with diabetic chronic kidney disease: Secondary | ICD-10-CM

## 2023-10-02 DIAGNOSIS — Z7984 Long term (current) use of oral hypoglycemic drugs: Secondary | ICD-10-CM | POA: Diagnosis not present

## 2023-10-02 MED ORDER — FLUTICASONE FUROATE-VILANTEROL 100-25 MCG/ACT IN AEPB: 1.0000 | INHALATION_SPRAY | Freq: Every day | RESPIRATORY_TRACT | 11 refills | Status: DC

## 2023-10-02 MED ORDER — GLIPIZIDE 5 MG PO TABS: 5.0000 mg | ORAL_TABLET | Freq: Two times a day (BID) | ORAL | 1 refills | Status: DC

## 2023-10-02 NOTE — Assessment & Plan Note (Signed)
 She is using Breo for management, as insurance does not cover Symbicort. There was an issue with the inhaler counter jumping unexpectedly, and she is advised to contact the manufacturer for a potential replacement. She prefers Symbicort but is unable to use it due to insurance coverage issues. - Ensure Breo is on the medication list and Symbicort is removed - Advise her to contact the manufacturer for inhaler replacement if malfunctioning - Send refill for Breo inhaler

## 2023-10-02 NOTE — Assessment & Plan Note (Signed)
 Blood glucose levels have improved significantly, now ranging from 130 to 180 mg/dL, compared to previous levels in the 200s and higher. The estimated A1c based on current readings is between 7-8%, a significant improvement from a previous A1c of 15%. The goal is to achieve fasting blood glucose levels between 100-130 mg/dL. She is currently on glipizide, Ozempic, and Jardiance, and has not experienced hypoglycemia with glipizide. The glipizide is considered a short-term measure to help lower blood glucose levels, with the hope that lifestyle changes will eventually allow for its discontinuation. She is making dietary changes and attempting to increase physical activity. The A1c will be rechecked next month, but it is expected to still be elevated due to previous high readings. The overall trend is encouraging, and no changes in medication are recommended at this time. - Continue glipizide 5 mg twice daily - Continue Ozempic 2 mg - Continue Jardiance 25 mg, metformin - Recheck A1c next month - Encourage dietary modifications and increased physical activity

## 2023-10-02 NOTE — Progress Notes (Signed)
 Established patient visit   Patient: Emma Fletcher   DOB: 20-Apr-1956   68 y.o. Female  MRN: [id number] Visit Date: 10/02/2023  Today's healthcare provider: Shirlee Latch, MD   Chief Complaint  Patient presents with   Medical Management of Chronic Issues    Last seen 08/27/23. A1c checked 08/06/23 and was 15.5%. Pt was to  - Continue Ozempic 2 mg weekly, metformin XR 1000 mg with breakfast and 500 mg at bedtime, glipizide 5 mg BID with meals, and Jardiance 25 mg daily. - Monitor blood glucose levels at different times of the day and maintain a log. - Follow up on October 02, 2023, to reassess blood glucose levels and overall diabetes management.   Diabetes    Pt sister reports blood sugar levels have been ranging from 134- 180 mg/dL since last visit, doing good with watching car intake as far as she knows.    Subjective    Diabetes   HPI     Medical Management of Chronic Issues    Additional comments: Last seen 08/27/23. A1c checked 08/06/23 and was 15.5%. Pt was to  - Continue Ozempic 2 mg weekly, metformin XR 1000 mg with breakfast and 500 mg at bedtime, glipizide 5 mg BID with meals, and Jardiance 25 mg daily. - Monitor blood glucose levels at different times of the day and maintain a log. - Follow up on October 02, 2023, to reassess blood glucose levels and overall diabetes management.        Diabetes    Additional comments: Pt sister reports blood sugar levels have been ranging from 134- 180 mg/dL since last visit, doing good with watching car intake as far as she knows.       Last edited by Acey Lav, CMA on 10/02/2023  8:07 AM.       Discussed the use of AI scribe software for clinical note transcription with the patient, who gave verbal consent to proceed.  History of Present Illness   The patient, with a history of diabetes, presents for a follow-up visit to discuss her blood sugar levels. She reports that her blood sugar levels have been fluctuating,  with readings ranging from 130 to 180. This is a significant improvement from previous levels, which were much higher. The patient acknowledges that her blood sugar levels are not yet where she needs to be, but she is making progress. She is currently taking glipizide 5mg  twice a day to manage her diabetes.  In addition to her diabetes, the patient also discusses her inhaler prescription. She reports that her inhaler seems to be malfunctioning, as the counter on the inhaler jumped unexpectedly. She is unsure if she has any refills left on this prescription.  The patient also mentions her blood pressure, which has been well-controlled recently. This is a significant improvement from previous visits, when her blood pressure was consistently high.         Medications: Outpatient Medications Prior to Visit  Medication Sig   ascorbic acid (VITAMIN C) 500 MG tablet Take 500 mg by mouth daily.   atorvastatin (LIPITOR) 20 MG tablet Take 1 tablet (20 mg total) by mouth daily.   Blood Glucose Monitoring Suppl (ONE TOUCH ULTRA 2) w/Device KIT Use as directed to check blood sugar once daily   Cholecalciferol (VITAMIN D3) 125 MCG (5000 UT) CAPS Take 1 capsule (5,000 Units total) by mouth daily. TAKE 1 CAPSULE EVERY DAY   empagliflozin (JARDIANCE) 25 MG TABS tablet Take 1  tablet (25 mg total) by mouth daily before breakfast.   Ferrous Sulfate (IRON) 325 (65 Fe) MG TABS Take 2 tablets by mouth daily.   furosemide (LASIX) 20 MG tablet TAKE 1 TABLET DAILY   glucose blood (ONE TOUCH ULTRA TEST) test strip Use as instructed to check blood glucose daily   lansoprazole (PREVACID) 30 MG capsule TAKE 1 CAPSULE DAILY   levothyroxine (SYNTHROID) 100 MCG tablet TAKE 1 TABLET DAILY   lisinopril (ZESTRIL) 20 MG tablet Take 1 tablet (20 mg total) by mouth daily.   metFORMIN (GLUCOPHAGE-XR) 500 MG 24 hr tablet Take 2 tablets (1,000 mg total) by mouth daily with breakfast AND 1 tablet (500 mg total) at bedtime.   Multiple  Vitamins-Minerals (ONE-A-DAY WOMENS 50 PLUS PO) Take 1 tablet by mouth daily.   OneTouch Delica Lancets 33G MISC Use as directed to check blood glucose daily   polyethylene glycol powder (GLYCOLAX/MIRALAX) 17 GM/SCOOP powder Please use one cup of miralax daily.   Semaglutide, 2 MG/DOSE, 8 MG/3ML SOPN Inject 2 mg as directed once a week.   [DISCONTINUED] budesonide-formoterol (SYMBICORT) 160-4.5 MCG/ACT inhaler Inhale 2 puffs into the lungs 2 (two) times daily.   [DISCONTINUED] glipiZIDE (GLUCOTROL) 5 MG tablet Take 1 tablet (5 mg total) by mouth 2 (two) times daily before a meal.   No facility-administered medications prior to visit.    Review of Systems     Objective    BP 108/68 (BP Location: Left Arm, Patient Position: Sitting, Cuff Size: Normal)   Pulse 79   Ht 5\' 5"  (1.651 m)   Wt 160 lb 11.2 oz (72.9 kg)   LMP  (LMP Unknown)   SpO2 100%   BMI 26.74 kg/m    Physical Exam Vitals reviewed.  Constitutional:      General: She is not in acute distress.    Appearance: Normal appearance. She is well-developed. She is not diaphoretic.  HENT:     Head: Normocephalic and atraumatic.  Eyes:     General: No scleral icterus.    Conjunctiva/sclera: Conjunctivae normal.  Neck:     Thyroid: No thyromegaly.  Cardiovascular:     Rate and Rhythm: Normal rate and regular rhythm.     Heart sounds: Normal heart sounds. No murmur heard. Pulmonary:     Effort: Pulmonary effort is normal. No respiratory distress.     Breath sounds: Normal breath sounds. No wheezing, rhonchi or rales.  Musculoskeletal:     Cervical back: Neck supple.     Right lower leg: No edema.     Left lower leg: No edema.  Lymphadenopathy:     Cervical: No cervical adenopathy.  Skin:    General: Skin is warm and dry.     Findings: No rash.  Neurological:     Mental Status: She is alert and oriented to person, place, and time. Mental status is at baseline.  Psychiatric:        Mood and Affect: Mood normal.         Behavior: Behavior normal.      No results found for any visits on 10/02/23.  Assessment & Plan     Problem List Items Addressed This Visit       Cardiovascular and Mediastinum   Hypertension associated with diabetes (HCC)   Blood pressure is well-controlled and significantly improved from previous visits.      Relevant Medications   glipiZIDE (GLUCOTROL) 5 MG tablet     Respiratory   Chronic obstructive pulmonary disease (HCC)  She is using Breo for management, as insurance does not cover Symbicort. There was an issue with the inhaler counter jumping unexpectedly, and she is advised to contact the manufacturer for a potential replacement. She prefers Symbicort but is unable to use it due to insurance coverage issues. - Ensure Breo is on the medication list and Symbicort is removed - Advise her to contact the manufacturer for inhaler replacement if malfunctioning - Send refill for Breo inhaler       Relevant Medications   fluticasone furoate-vilanterol (BREO ELLIPTA) 100-25 MCG/ACT AEPB     Endocrine   T2DM (type 2 diabetes mellitus) (HCC) - Primary   Blood glucose levels have improved significantly, now ranging from 130 to 180 mg/dL, compared to previous levels in the 200s and higher. The estimated A1c based on current readings is between 7-8%, a significant improvement from a previous A1c of 15%. The goal is to achieve fasting blood glucose levels between 100-130 mg/dL. She is currently on glipizide, Ozempic, and Jardiance, and has not experienced hypoglycemia with glipizide. The glipizide is considered a short-term measure to help lower blood glucose levels, with the hope that lifestyle changes will eventually allow for its discontinuation. She is making dietary changes and attempting to increase physical activity. The A1c will be rechecked next month, but it is expected to still be elevated due to previous high readings. The overall trend is encouraging, and no changes in  medication are recommended at this time. - Continue glipizide 5 mg twice daily - Continue Ozempic 2 mg - Continue Jardiance 25 mg, metformin - Recheck A1c next month - Encourage dietary modifications and increased physical activity      Relevant Medications   glipiZIDE (GLUCOTROL) 5 MG tablet      Return in about 4 weeks (around 10/30/2023) for chronic disease f/u, as scheduled with POC a1c.       Aden Agreste, MD  [facility] [phone] (phone) [phone] (fax)  [facility]

## 2023-10-02 NOTE — Assessment & Plan Note (Signed)
 Blood pressure is well-controlled and significantly improved from previous visits.

## 2023-10-02 NOTE — Telephone Encounter (Signed)
 [facility] pharmacy is requesting refill fluticasone furoate-vilanterol (BREO ELLIPTA) 100-25 MCG/ACT AEPB  Please advise

## 2023-10-09 ENCOUNTER — Other Ambulatory Visit: Payer: Self-pay | Admitting: Family Medicine

## 2023-10-09 NOTE — Telephone Encounter (Signed)
 Requested Prescriptions  Pending Prescriptions Disp Refills   lisinopril  (ZESTRIL ) 20 MG tablet [Pharmacy Med Name: LISINOPRIL  TABS 20MG ] 90 tablet 1    Sig: TAKE 1 TABLET DAILY     Cardiovascular:  ACE Inhibitors Passed - 10/09/2023  3:56 PM      Passed - Cr in normal range and within 180 days    Creatinine, Ser  Date Value Ref Range Status  05/07/2023 0.84 0.57 - 1.00 mg/dL Final         Passed - K in normal range and within 180 days    Potassium  Date Value Ref Range Status  05/07/2023 4.8 3.5 - 5.2 mmol/L Final         Passed - Patient is not pregnant      Passed - Last BP in normal range    BP Readings from Last 1 Encounters:  10/02/23 108/68         Passed - Valid encounter within last 6 months    Recent Outpatient Visits           1 week ago Type 2 diabetes mellitus with stage 2 chronic kidney disease, without long-term current use of insulin  (HCC)   [facility] [place], Stan Eans, MD   1 month ago Type 2 diabetes mellitus with stage 2 chronic kidney disease, without long-term current use of insulin  [facility])   [place] [facility] [place], Stan Eans, MD   2 months ago Dysuria   [facility] Lamon Pillow, MD   2 months ago Type 2 diabetes mellitus with stage 2 chronic kidney disease, without long-term current use of insulin  [facility])    [facility] Bacigalupo, Stan Eans, MD       Future Appointments             In 3 weeks Bacigalupo, Stan Eans, MD [facility]

## 2023-10-16 ENCOUNTER — Other Ambulatory Visit: Payer: Self-pay | Admitting: Family Medicine

## 2023-10-16 DIAGNOSIS — E1169 Type 2 diabetes mellitus with other specified complication: Secondary | ICD-10-CM

## 2023-10-16 NOTE — Telephone Encounter (Signed)
 Copied from CRM [id number]. Topic: Clinical - Medication Refill >> Oct 16, 2023 12:38 PM Elle L wrote: Most Recent Primary Care Visit:  Provider: Mazie Speed  Department: [facility]  Visit Type: OFFICE VISIT  Date: 10/02/2023  Medication: atorvastatin  (LIPITOR) 20 MG tablet  Has the patient contacted their pharmacy? Yes  Is this the correct pharmacy for this prescription? Yes  This is the patient's preferred pharmacy:   [facility] - [place], [place] - [address] [place] [address] Phone: [phone] Fax: [phone]   Has the prescription been filled recently? Yes  Is the patient out of the medication? No  Has the patient been seen for an appointment in the last year OR does the patient have an upcoming appointment? Yes  Can we respond through MyChart? Yes  Agent: Please be advised that Rx refills may take up to 3 business days. We ask that you follow-up with your pharmacy.

## 2023-10-19 NOTE — Telephone Encounter (Signed)
 Too soon for refill, last RF 09/27/23 for 90 days.  Requested Prescriptions  Pending Prescriptions Disp Refills   atorvastatin  (LIPITOR) 20 MG tablet 90 tablet 1    Sig: Take 1 tablet (20 mg total) by mouth daily.     Cardiovascular:  Antilipid - Statins Failed - 10/19/2023  9:08 AM      Failed - Lipid Panel in normal range within the last 12 months    Cholesterol, Total  Date Value Ref Range Status  10/31/2022 148 100 - 199 mg/dL Final   LDL Chol Calc (NIH)  Date Value Ref Range Status  10/31/2022 72 0 - 99 mg/dL Final   HDL  Date Value Ref Range Status  10/31/2022 60 >39 mg/dL Final   Triglycerides  Date Value Ref Range Status  10/31/2022 86 0 - 149 mg/dL Final         Passed - Patient is not pregnant      Passed - Valid encounter within last 12 months    Recent Outpatient Visits           2 weeks ago Type 2 diabetes mellitus with stage 2 chronic kidney disease, without long-term current use of insulin  (HCC)   [facility] [place], Stan Eans, MD   1 month ago Type 2 diabetes mellitus with stage 2 chronic kidney disease, without long-term current use of insulin  [facility])   [facility] [place], Stan Eans, MD   2 months ago Dysuria   [facility] Lamon Pillow, MD   2 months ago Type 2 diabetes mellitus with stage 2 chronic kidney disease, without long-term current use of insulin  [facility])   [facility] Bacigalupo, Stan Eans, MD       Future Appointments             In 2 weeks Bacigalupo, Stan Eans, MD [facility]

## 2023-11-05 ENCOUNTER — Encounter: Payer: Self-pay | Admitting: Family Medicine

## 2023-11-05 ENCOUNTER — Ambulatory Visit (INDEPENDENT_AMBULATORY_CARE_PROVIDER_SITE_OTHER): Payer: Medicare Other | Admitting: Family Medicine

## 2023-11-05 VITALS — BP 117/69 | HR 69 | Ht 65.0 in | Wt 160.0 lb

## 2023-11-05 DIAGNOSIS — I152 Hypertension secondary to endocrine disorders: Secondary | ICD-10-CM

## 2023-11-05 DIAGNOSIS — E785 Hyperlipidemia, unspecified: Secondary | ICD-10-CM | POA: Diagnosis not present

## 2023-11-05 DIAGNOSIS — E039 Hypothyroidism, unspecified: Secondary | ICD-10-CM | POA: Diagnosis not present

## 2023-11-05 DIAGNOSIS — E1169 Type 2 diabetes mellitus with other specified complication: Secondary | ICD-10-CM

## 2023-11-05 DIAGNOSIS — E1122 Type 2 diabetes mellitus with diabetic chronic kidney disease: Secondary | ICD-10-CM

## 2023-11-05 DIAGNOSIS — E1159 Type 2 diabetes mellitus with other circulatory complications: Secondary | ICD-10-CM | POA: Diagnosis not present

## 2023-11-05 DIAGNOSIS — N182 Chronic kidney disease, stage 2 (mild): Secondary | ICD-10-CM | POA: Diagnosis not present

## 2023-11-05 NOTE — Assessment & Plan Note (Signed)
 Blood glucose levels have improved significantly, with a recent reading of 135 mg/dL, down from previous levels of 400-500 mg/dL. She adheres to diet and exercise, including walking and using a stationary bike. Current medications include atorvastatin , Jardiance , glipizide , metformin  XR, and Ozempic . The A1c test is due to assess long-term glucose control, and it is expected that the A1c may not fully reflect recent improvements due to high levels earlier in the three-month period. The glipizide  dosage may be adjusted based on future A1c results. - Order A1c test - Continue current diabetes medications: atorvastatin , Jardiance , glipizide , metformin  XR, and Ozempic  - pending A1c result - Reassess diabetes management in three months

## 2023-11-05 NOTE — Assessment & Plan Note (Signed)
 She is on atorvastatin  20 mg daily for cholesterol management. A cholesterol panel is due to assess current lipid levels, as it has been a year since the last panel. - Order cholesterol panel

## 2023-11-05 NOTE — Assessment & Plan Note (Signed)
 Blood pressure is well-controlled with lifestyle modifications, including diet and exercise. - continue lisinopril 

## 2023-11-05 NOTE — Progress Notes (Signed)
 Established patient visit   Patient: Emma Fletcher   DOB: [birth year]   68 y.o. Female  MRN: [id number] Visit Date: 11/05/2023  Today's healthcare provider: Aden Agreste, MD   Chief Complaint  Patient presents with   Diabetes    No concerns    Subjective    Diabetes   HPI     Diabetes    Additional comments: No concerns       Last edited by Bart Lieu, CMA on 11/05/2023  8:49 AM.       Discussed the use of AI scribe software for clinical note transcription with the patient, who gave verbal consent to proceed.  History of Present Illness   Emma Fletcher is a 68 year old female with diabetes who presents for follow-up on blood sugar management.  Her blood sugar levels have improved, with a current reading of 135 mg/dL, down from previous levels of 400-500 mg/dL. She attributes this improvement to dietary changes and increased physical activity, including walking and using a stationary bike. Her current medications include atorvastatin  20 mg daily, Jardiance  25 mg daily, glipizide  5 mg twice daily, metformin  XR 1000 mg in the morning and 500 mg at night, and Ozempic  2 mg weekly.  Her blood pressure has also shown improvement, although it remains generally high. She is focusing on diet and exercise to manage it.  She experiences nasal drainage, likely due to allergies, and uses a nasal spray. She avoids Mucinex, believing it raises her blood sugar levels.         Medications: Outpatient Medications Prior to Visit  Medication Sig   ascorbic acid  (VITAMIN C ) 500 MG tablet Take 500 mg by mouth daily.   atorvastatin  (LIPITOR) 20 MG tablet Take 1 tablet (20 mg total) by mouth daily.   Blood Glucose Monitoring Suppl (ONE TOUCH ULTRA 2) w/Device KIT Use as directed to check blood sugar once daily   Cholecalciferol  (VITAMIN D3) 125 MCG (5000 UT) CAPS Take 1 capsule (5,000 Units total) by mouth daily. TAKE 1 CAPSULE EVERY DAY   empagliflozin  (JARDIANCE )  25 MG TABS tablet Take 1 tablet (25 mg total) by mouth daily before breakfast.   Ferrous Sulfate (IRON) 325 (65 Fe) MG TABS Take 2 tablets by mouth daily.   fluticasone  furoate-vilanterol (BREO ELLIPTA ) 100-25 MCG/ACT AEPB Inhale 1 puff into the lungs daily.   furosemide  (LASIX ) 20 MG tablet TAKE 1 TABLET DAILY   glipiZIDE  (GLUCOTROL ) 5 MG tablet Take 1 tablet (5 mg total) by mouth 2 (two) times daily before a meal.   glucose blood (ONE TOUCH ULTRA TEST) test strip Use as instructed to check blood glucose daily   lansoprazole  (PREVACID ) 30 MG capsule TAKE 1 CAPSULE DAILY   levothyroxine  (SYNTHROID ) 100 MCG tablet TAKE 1 TABLET DAILY   lisinopril  (ZESTRIL ) 20 MG tablet TAKE 1 TABLET DAILY   metFORMIN  (GLUCOPHAGE -XR) 500 MG 24 hr tablet Take 2 tablets (1,000 mg total) by mouth daily with breakfast AND 1 tablet (500 mg total) at bedtime.   Multiple Vitamins-Minerals (ONE-A-DAY WOMENS 50 PLUS PO) Take 1 tablet by mouth daily.   OneTouch Delica Lancets 33G MISC Use as directed to check blood glucose daily   polyethylene glycol powder (GLYCOLAX /MIRALAX ) 17 GM/SCOOP powder Please use one cup of miralax  daily.   Semaglutide , 2 MG/DOSE, 8 MG/3ML SOPN Inject 2 mg as directed once a week.   No facility-administered medications prior to visit.    Review of Systems  Objective    BP 117/69   Pulse 69   Ht 5\' 5"  (1.651 m)   Wt 160 lb (72.6 kg)   LMP  (LMP Unknown)   SpO2 100%   BMI 26.63 kg/m    Physical Exam Vitals reviewed.  Constitutional:      General: She is not in acute distress.    Appearance: Normal appearance. She is well-developed. She is not diaphoretic.  HENT:     Head: Normocephalic and atraumatic.  Eyes:     General: No scleral icterus.    Conjunctiva/sclera: Conjunctivae normal.  Neck:     Thyroid : No thyromegaly.  Cardiovascular:     Rate and Rhythm: Normal rate and regular rhythm.     Pulses: Normal pulses.     Heart sounds: Normal heart sounds. No murmur  heard. Pulmonary:     Effort: Pulmonary effort is normal. No respiratory distress.     Breath sounds: Normal breath sounds. No wheezing, rhonchi or rales.  Musculoskeletal:     Cervical back: Neck supple.     Right lower leg: No edema.     Left lower leg: No edema.  Lymphadenopathy:     Cervical: No cervical adenopathy.  Skin:    General: Skin is warm and dry.     Findings: No rash.  Neurological:     Mental Status: She is alert and oriented to person, place, and time. Mental status is at baseline.  Psychiatric:        Mood and Affect: Mood normal.        Behavior: Behavior normal.      No results found for any visits on 11/05/23.  Assessment & Plan     Problem List Items Addressed This Visit       Cardiovascular and Mediastinum   Hypertension associated with diabetes (HCC)   Blood pressure is well-controlled with lifestyle modifications, including diet and exercise. - continue lisinopril       Relevant Orders   Comprehensive metabolic panel with GFR     Endocrine   T2DM (type 2 diabetes mellitus) (HCC) - Primary   Blood glucose levels have improved significantly, with a recent reading of 135 mg/dL, down from previous levels of 400-500 mg/dL. She adheres to diet and exercise, including walking and using a stationary bike. Current medications include atorvastatin , Jardiance , glipizide , metformin  XR, and Ozempic . The A1c test is due to assess long-term glucose control, and it is expected that the A1c may not fully reflect recent improvements due to high levels earlier in the three-month period. The glipizide  dosage may be adjusted based on future A1c results. - Order A1c test - Continue current diabetes medications: atorvastatin , Jardiance , glipizide , metformin  XR, and Ozempic  - pending A1c result - Reassess diabetes management in three months      Relevant Orders   Hemoglobin A1c   Hyperlipidemia associated with type 2 diabetes mellitus (HCC)   She is on atorvastatin  20  mg daily for cholesterol management. A cholesterol panel is due to assess current lipid levels, as it has been a year since the last panel. - Order cholesterol panel       Relevant Orders   Comprehensive metabolic panel with GFR   Lipid panel   Hypothyroidism   Hypothyroidism managed with Synthroid  100 mcg daily. - Continue Synthroid  100 mcg daily.      Relevant Orders   TSH     Genitourinary   CKD (chronic kidney disease), stage II   CKD2 with microalbuminuria. - Continue  monitoring kidney function and manage comorbid conditions.      Relevant Orders   Comprehensive metabolic panel with GFR     Return in about 3 months (around 02/05/2024) for chronic disease f/u.       Aden Agreste, MD  [facility] [phone] (phone) [phone] (fax)  [facility]

## 2023-11-05 NOTE — Assessment & Plan Note (Signed)
 CKD2 with microalbuminuria. - Continue monitoring kidney function and manage comorbid conditions.

## 2023-11-05 NOTE — Assessment & Plan Note (Signed)
 Hypothyroidism managed with Synthroid 100 mcg daily. - Continue Synthroid 100 mcg daily.

## 2023-11-06 ENCOUNTER — Ambulatory Visit: Payer: Self-pay | Admitting: Family Medicine

## 2023-11-06 LAB — LIPID PANEL
Chol/HDL Ratio: 2.7 ratio (ref 0.0–4.4)
Cholesterol, Total: 144 mg/dL (ref 100–199)
HDL: 54 mg/dL (ref >39)
LDL Chol Calc (NIH): 77 mg/dL (ref 0–99)
Triglycerides: 64 mg/dL (ref 0–149)
VLDL Cholesterol Cal: 13 mg/dL (ref 5–40)

## 2023-11-06 LAB — COMPREHENSIVE METABOLIC PANEL WITH GFR
ALT: 10 IU/L (ref 0–32)
AST: 11 IU/L (ref 0–40)
Albumin: 4.2 g/dL (ref 3.9–4.9)
Alkaline Phosphatase: 102 IU/L (ref 44–121)
BUN/Creatinine Ratio: 34 — ABNORMAL HIGH (ref 12–28)
BUN: 26 mg/dL (ref 8–27)
Bilirubin Total: 0.2 mg/dL (ref 0.0–1.2)
CO2: 20 mmol/L (ref 20–29)
Calcium: 9.7 mg/dL (ref 8.7–10.3)
Chloride: 101 mmol/L (ref 96–106)
Creatinine, Ser: 0.77 mg/dL (ref 0.57–1.00)
Globulin, Total: 2.2 g/dL (ref 1.5–4.5)
Glucose: 131 mg/dL — ABNORMAL HIGH (ref 70–99)
Potassium: 4.4 mmol/L (ref 3.5–5.2)
Sodium: 137 mmol/L (ref 134–144)
Total Protein: 6.4 g/dL (ref 6.0–8.5)
eGFR: 85 mL/min/{1.73_m2} (ref >59)

## 2023-11-06 LAB — HEMOGLOBIN A1C
Est. average glucose Bld gHb Est-mCnc: 183 mg/dL
Hgb A1c MFr Bld: 8 % — ABNORMAL HIGH (ref 4.8–5.6)

## 2023-11-06 LAB — TSH: TSH: 0.855 u[IU]/mL (ref 0.450–4.500)

## 2023-11-09 ENCOUNTER — Other Ambulatory Visit: Payer: Self-pay | Admitting: Family Medicine

## 2023-11-25 ENCOUNTER — Other Ambulatory Visit: Payer: Self-pay | Admitting: Family Medicine

## 2023-11-25 DIAGNOSIS — E1122 Type 2 diabetes mellitus with diabetic chronic kidney disease: Secondary | ICD-10-CM

## 2023-12-31 ENCOUNTER — Ambulatory Visit (INDEPENDENT_AMBULATORY_CARE_PROVIDER_SITE_OTHER): Admitting: Podiatry

## 2023-12-31 DIAGNOSIS — B351 Tinea unguium: Secondary | ICD-10-CM

## 2023-12-31 DIAGNOSIS — M79676 Pain in unspecified toe(s): Secondary | ICD-10-CM | POA: Diagnosis not present

## 2023-12-31 DIAGNOSIS — E1122 Type 2 diabetes mellitus with diabetic chronic kidney disease: Secondary | ICD-10-CM | POA: Diagnosis not present

## 2023-12-31 DIAGNOSIS — Q828 Other specified congenital malformations of skin: Secondary | ICD-10-CM

## 2023-12-31 DIAGNOSIS — N182 Chronic kidney disease, stage 2 (mild): Secondary | ICD-10-CM

## 2024-01-03 ENCOUNTER — Telehealth: Payer: Self-pay | Admitting: Family Medicine

## 2024-01-03 ENCOUNTER — Other Ambulatory Visit: Payer: Self-pay

## 2024-01-03 DIAGNOSIS — E1169 Type 2 diabetes mellitus with other specified complication: Secondary | ICD-10-CM

## 2024-01-03 MED ORDER — ATORVASTATIN CALCIUM 20 MG PO TABS
20.0000 mg | ORAL_TABLET | Freq: Every day | ORAL | 0 refills | Status: DC
Start: 2024-01-03 — End: 2024-03-26

## 2024-01-03 MED ORDER — METFORMIN HCL ER 500 MG PO TB24: ORAL_TABLET | ORAL | 0 refills | Status: DC

## 2024-01-03 MED ORDER — FUROSEMIDE 20 MG PO TABS: 20.0000 mg | ORAL_TABLET | Freq: Every day | ORAL | 0 refills | Status: DC

## 2024-01-03 MED ORDER — LEVOTHYROXINE SODIUM 100 MCG PO TABS: 100.0000 ug | ORAL_TABLET | Freq: Every day | ORAL | 0 refills | Status: DC

## 2024-01-03 NOTE — Addendum Note (Signed)
 Addended by: Dashiell Franchino E on: 01/03/2024 02:34 PM   Modules accepted: Orders

## 2024-01-03 NOTE — Telephone Encounter (Unsigned)
 Copied from CRM [id number]. Topic: Clinical - Prescription Issue >> Jan 03, 2024  2:03 PM Tiffini S wrote: Reason for CRM: June Watson [phone]/ patient sister called to follow up on request for medications that the patient is out of and she is asking to send the medicine to the pharmacy today/ she is asking for a call back today for a update

## 2024-01-03 NOTE — Telephone Encounter (Signed)
 Copied from CRM [id number]. Topic: Clinical - Prescription Issue >> Jan 03, 2024  7:51 AM Cleave MATSU wrote: Reason for CRM: pt said [facility] did not process her order so they went in and expedited she said she is currently out of medication and [facility] told her that her dr would call her in some that will last her a week until [facility] process her medication. Medication is Metformin  1000mg  , Furosemide  20mg  , Atorvastatin  20 mg , Synthroid  100 MCG , and Lasarton 30 MG . PLEASE FOLLOW UP

## 2024-01-04 NOTE — Telephone Encounter (Signed)
 Prescriptions sent in and spoke with June.

## 2024-01-06 ENCOUNTER — Encounter: Payer: Self-pay | Admitting: Podiatry

## 2024-01-06 NOTE — Progress Notes (Signed)
 Subjective:  Patient ID: Emma Fletcher, female    DOB: January 05, 1956,  MRN: [id number]  Emma Fletcher presents to clinic today for at risk foot care. Pt has h/o NIDDM with chronic kidney disease and painful thick toenails that are difficult to trim. Pain interferes with ambulation. Aggravating factors include wearing enclosed shoe gear. Pain is relieved with periodic professional debridement.  Chief Complaint  Patient presents with   Nail Problem    Thick painful toenails, 3 month follow up    New problem(s): Patient states she has a hard spot on the side of her right foot she'd like me to address. She also relates she stubbed her left foot about 3 weeks ago and has a bruise on a few toes. Denies any pain today and states the bruise is resolving.  PCP is Myrla Jon HERO, MD. Emma Fletcher 10/02/2023.  No Known Allergies  Review of Systems: Negative except as noted in the HPI.  Objective: No changes noted in today's physical examination. There were no vitals filed for this visit. Emma Fletcher is a pleasant 68 y.o. female WD, WN in NAD. AAO x 3.  Vascular Examination: Capillary refill time immediate b/l. Vascular status intact b/l with palpable pedal pulses. Pedal hair present b/l. No pain with calf compression b/l. Skin temperature gradient WNL b/l. No cyanosis or clubbing b/l. No ischemia or gangrene noted b/l. No edema noted b/l LE.  Neurological Examination: Sensation grossly intact b/l with 10 gram monofilament. Vibratory sensation intact b/l.   Dermatological Examination: Ecchymosis noted base of digits 1-3 left foot. No breaks in skin, no erythema, no edema. Pedal skin with normal turgor, texture and tone b/l.  No open wounds. No interdigital macerations.   Toenails 1-5 b/l thick, discolored, elongated with subungual debris and pain on dorsal palpation.   Porokeratotic lesion(s) 5th met base right foot. No erythema, no edema, no drainage, no fluctuance.  Musculoskeletal  Examination: Muscle strength 5/5 to all lower extremity muscle groups bilaterally. No pain, crepitus or joint limitation noted with ROM bilateral LE. No gross bony deformities bilaterally.  Radiographs: None  Last A1c:      Latest Ref Rng & Units 11/05/2023    9:19 AM 08/06/2023    8:57 AM 08/06/2023    8:49 AM 05/07/2023    9:00 AM 02/05/2023    8:19 AM  Hemoglobin A1C  Hemoglobin-A1c 4.8 - 5.6 % 8.0  >15.5  15.0  7.8  7.2    Assessment/Plan: 1. Pain due to onychomycosis of toenail   2. Porokeratosis   3. Type 2 diabetes mellitus with stage 2 chronic kidney disease, without long-term current use of insulin  [facility])    Patient was evaluated and treated. All patient's and/or POA's questions/concerns addressed on today's visit. Remote h/o injury right foot which is resolving. Monitor. Toenails 1-5 debrided in length and girth without incident. Porokeratotic lesion(s) 5th met base right foot pared with sharp debridement without incident. Continue daily foot inspections and monitor blood glucose per PCP/Endocrinologist's recommendations. Continue soft, supportive shoe gear daily. Report any pedal injuries to medical professional. Call office if there are any questions/concerns. Return in about 3 months (around 04/01/2024).  Emma Fletcher, DPM      [facility] LOCATION: [address]  [place], [place] [address]                   Office [phone]   [facility] LOCATION: [address] [place], [place] [address] Office [phone]

## 2024-02-03 ENCOUNTER — Other Ambulatory Visit: Payer: Self-pay | Admitting: Family Medicine

## 2024-02-04 ENCOUNTER — Encounter: Payer: Self-pay | Admitting: Family Medicine

## 2024-02-04 ENCOUNTER — Ambulatory Visit: Admitting: Family Medicine

## 2024-02-04 VITALS — BP 115/67 | HR 71 | Ht 65.0 in | Wt 159.0 lb

## 2024-02-04 DIAGNOSIS — E1159 Type 2 diabetes mellitus with other circulatory complications: Secondary | ICD-10-CM | POA: Diagnosis not present

## 2024-02-04 DIAGNOSIS — E1122 Type 2 diabetes mellitus with diabetic chronic kidney disease: Secondary | ICD-10-CM | POA: Diagnosis not present

## 2024-02-04 DIAGNOSIS — E1169 Type 2 diabetes mellitus with other specified complication: Secondary | ICD-10-CM

## 2024-02-04 DIAGNOSIS — E66812 Obesity, class 2: Secondary | ICD-10-CM

## 2024-02-04 DIAGNOSIS — Z7984 Long term (current) use of oral hypoglycemic drugs: Secondary | ICD-10-CM

## 2024-02-04 DIAGNOSIS — Z6835 Body mass index (BMI) 35.0-35.9, adult: Secondary | ICD-10-CM

## 2024-02-04 DIAGNOSIS — E785 Hyperlipidemia, unspecified: Secondary | ICD-10-CM

## 2024-02-04 DIAGNOSIS — I429 Cardiomyopathy, unspecified: Secondary | ICD-10-CM

## 2024-02-04 DIAGNOSIS — N182 Chronic kidney disease, stage 2 (mild): Secondary | ICD-10-CM

## 2024-02-04 DIAGNOSIS — J309 Allergic rhinitis, unspecified: Secondary | ICD-10-CM

## 2024-02-04 DIAGNOSIS — I152 Hypertension secondary to endocrine disorders: Secondary | ICD-10-CM

## 2024-02-04 DIAGNOSIS — R0982 Postnasal drip: Secondary | ICD-10-CM

## 2024-02-04 LAB — POCT GLYCOSYLATED HEMOGLOBIN (HGB A1C): Hemoglobin A1C: 7.4 % — AB (ref 4.0–5.6)

## 2024-02-04 MED ORDER — OZEMPIC (2 MG/DOSE) 8 MG/3ML ~~LOC~~ SOPN: 2.0000 mg | PEN_INJECTOR | SUBCUTANEOUS | 5 refills | Status: AC

## 2024-02-04 MED ORDER — FLUTICASONE FUROATE-VILANTEROL 100-25 MCG/ACT IN AEPB: 1.0000 | INHALATION_SPRAY | Freq: Every day | RESPIRATORY_TRACT | 11 refills | Status: DC

## 2024-02-04 NOTE — Progress Notes (Signed)
 Established Patient Office Visit  Subjective   Patient ID: Emma Fletcher, female    DOB: 12-May-1956  Age: 68 y.o. MRN: [id number]  Chief Complaint  Patient presents with   Diabetes    Emma Fletcher is a 68 yo F with a history of HTN, T2DM, HLD, and CKD who presents to the clinic for follow up management of her chronic conditions. She is present with her sister who is helping her answer questions.  On interview, she reports that her diabetes has been under better control since her last visit with lifestyle modifications and medication management. She reports improvement in her fasting blood glucose measurements at home and denies any adverse symptoms or medication side effects. She also reports good control of her HTN and HLD with medications. She is interested in learning if her diabetes management has improved her A1C today. She also reports concern regarding postnasal drip and mucus production in the morning and evenings. She reports a multi-month history without any systemic symptoms, shortness of breath, or preceding illness.  She reports no other concerns today and is interested in addressing health maintenance goals.      ROS    Objective:     BP 115/67   Pulse 71   Ht 5' 5 (1.651 m)   Wt 159 lb (72.1 kg)   LMP  (LMP Unknown)   BMI 26.46 kg/m    Physical Exam Vitals reviewed.  Constitutional:      General: She is not in acute distress.    Appearance: Normal appearance. She is not ill-appearing or diaphoretic.  HENT:     Head: Normocephalic.     Right Ear: Tympanic membrane, ear canal and external ear normal.     Left Ear: Tympanic membrane, ear canal and external ear normal.     Nose: Nose normal.     Mouth/Throat:     Mouth: Mucous membranes are moist.     Pharynx: Oropharynx is clear. No oropharyngeal exudate or posterior oropharyngeal erythema.  Eyes:     General: No scleral icterus.    Conjunctiva/sclera: Conjunctivae normal.     Pupils: Pupils are  equal, round, and reactive to light.  Cardiovascular:     Rate and Rhythm: Normal rate and regular rhythm.     Pulses: Normal pulses.     Heart sounds: Normal heart sounds. No murmur heard.    No friction rub. No gallop.  Pulmonary:     Effort: Pulmonary effort is normal. No respiratory distress.     Breath sounds: Normal breath sounds. No wheezing.  Abdominal:     General: There is no distension.     Palpations: Abdomen is soft.     Tenderness: There is no abdominal tenderness. There is no guarding.  Musculoskeletal:     Cervical back: Normal range of motion.     Right lower leg: No edema.     Left lower leg: No edema.  Lymphadenopathy:     Cervical: No cervical adenopathy.  Skin:    General: Skin is warm and dry.     Capillary Refill: Capillary refill takes less than 2 seconds.  Neurological:     Mental Status: She is alert and oriented to person, place, and time.  Psychiatric:        Mood and Affect: Mood normal.      Results for orders placed or performed in visit on 02/04/24  POCT HgB A1C  Result Value Ref Range   Hemoglobin A1C 7.4 (A) 4.0 -  5.6 %   HbA1c POC (<> result, manual entry)     HbA1c, POC (prediabetic range)     HbA1c, POC (controlled diabetic range)        The 10-year ASCVD risk score (Arnett DK, et al., 2019) is: 12.3%    Assessment & Plan:   Problem List Items Addressed This Visit     Hypertension associated with diabetes (HCC) - Primary   Currently well controlled on medication regimen. Denies any symptoms or adverse medication side effects. Last CMP was stable. - Continue lisinopril  and lasix  - Order CMP at next visit       Relevant Medications   Semaglutide , 2 MG/DOSE, (OZEMPIC , 2 MG/DOSE,) 8 MG/3ML SOPN   Secondary cardiomyopathy (HCC)   F/b cards Continue to control BP well Continue lasix       T2DM (type 2 diabetes mellitus) (HCC)   Currently not controlled, but trending towards control. Recent history of diabetes exacerbation  with elevated A1C in the double digits. Notable improvement with lifestyle changes and medication management. Currently on Jardiance , glipizide , Metformin  XR, and ozempic . Last A1C was 8.0. - Order POCT A1C (resulted 7.4) - Order UACR - Continue current medication regimen - Continue lifestyle modifications, including diet management and sweet tea reduction (goal of only one cup a day) - Reassess diabetes management at next follow up in 3 months      Relevant Medications   Semaglutide , 2 MG/DOSE, (OZEMPIC , 2 MG/DOSE,) 8 MG/3ML SOPN   Other Relevant Orders   Urine Microalbumin w/creat. ratio   POCT HgB A1C (Completed)   CKD (chronic kidney disease), stage II   Currently well controlled on medication regimen. Denies any symptoms or adverse medication side effects. Last CMP was stable. - Continue lisinopril  daily - Continue to monitor and manage comorbid conditions       Hyperlipidemia associated with type 2 diabetes mellitus (HCC)   Currently well controlled on medication regimen. Denies any symptoms or adverse medication side effects. Last FLP was stable 3 months ago. - Continue atorvastatin  20 mg daily       Relevant Medications   Semaglutide , 2 MG/DOSE, (OZEMPIC , 2 MG/DOSE,) 8 MG/3ML SOPN   Class 2 severe obesity with serious comorbidity and body mass index (BMI) of 35.0 to 35.9 in adult, unspecified obesity type (HCC)   Discussed importance of healthy weight management Discussed diet and exercise measures - Continue Ozempic  to help with diabetes and weight loss      Relevant Medications   Semaglutide , 2 MG/DOSE, (OZEMPIC , 2 MG/DOSE,) 8 MG/3ML SOPN   Other Visit Diagnoses       Allergic rhinitis with postnasal drip          Allergic rhinitis with postnasal drip Patient presents with multi-month history of postnasal drip and white sputum production in the morning and evening. Describes sputum as clear/white. Denies any sick contacts, systemic symptoms, shortness of breath,  or sinus pain. Reports history of allergic rhinitis and COPD. Likely etiology includes postnasal drip from allergic rhinitis with sputum production/irritation from COPD. - Continue to monitor - Discussed starting daily Claritin to help with allergic rhinitis symptoms - Return if symptoms worsen, systemic symptoms develop, or shortness of breath occurs  General Health Maintenance Discussed wellness and preventative health screenings to achieve health maintenance goals. - Encouraged flu and Covid vaccinations in the fall - Discussed obtaining shingles vaccination at local pharmacy - Eye exam is scheduled for November - Patient and her sister will schedule mammogram for next month - Up to  date on other wellness measures and preventative screenings   Return in about 3 months (around 05/06/2024) for chronic disease f/u.    Elia LULLA Blanch, Medical Student   Patient seen along with MS3 student, Elia Blanch. I personally evaluated this patient along with the student, and verified all aspects of the history, physical exam, and medical decision making as documented by the student. I agree with the student's documentation and have made all necessary edits.  Maylie Ashton, Jon HERO, MD, MPH [facility]

## 2024-02-04 NOTE — Assessment & Plan Note (Signed)
 Currently well controlled on medication regimen. Denies any symptoms or adverse medication side effects. Last CMP was stable. - Continue lisinopril  and lasix  - Order CMP at next visit

## 2024-02-04 NOTE — Assessment & Plan Note (Signed)
 Currently not controlled, but trending towards control. Recent history of diabetes exacerbation with elevated A1C in the double digits. Notable improvement with lifestyle changes and medication management. Currently on Jardiance , glipizide , Metformin  XR, and ozempic . Last A1C was 8.0. - Order POCT A1C (resulted 7.4) - Order UACR - Continue current medication regimen - Continue lifestyle modifications, including diet management and sweet tea reduction (goal of only one cup a day) - Reassess diabetes management at next follow up in 3 months

## 2024-02-04 NOTE — Assessment & Plan Note (Signed)
 Currently well controlled on medication regimen. Denies any symptoms or adverse medication side effects. Last CMP was stable. - Continue lisinopril  daily - Continue to monitor and manage comorbid conditions

## 2024-02-04 NOTE — Assessment & Plan Note (Signed)
 Currently well controlled on medication regimen. Denies any symptoms or adverse medication side effects. Last FLP was stable 3 months ago. - Continue atorvastatin  20 mg daily

## 2024-02-04 NOTE — Assessment & Plan Note (Signed)
 F/b cards Continue to control BP well Continue lasix

## 2024-02-04 NOTE — Assessment & Plan Note (Signed)
 Discussed importance of healthy weight management Discussed diet and exercise measures - Continue Ozempic  to help with diabetes and weight loss

## 2024-02-05 ENCOUNTER — Ambulatory Visit: Payer: Self-pay | Admitting: Family Medicine

## 2024-02-05 LAB — MICROALBUMIN / CREATININE URINE RATIO
Creatinine, Urine: 41.3 mg/dL
Microalb/Creat Ratio: 19 mg/g{creat} (ref 0–29)
Microalbumin, Urine: 7.7 ug/mL

## 2024-03-13 ENCOUNTER — Telehealth: Payer: Self-pay | Admitting: Family Medicine

## 2024-03-13 NOTE — Telephone Encounter (Signed)
[facility] faxed refill request for the following medications: ? ?glipiZIDE (GLUCOTROL) 5 MG tablet  ? ? ?Please advise. ? ?

## 2024-03-14 ENCOUNTER — Other Ambulatory Visit: Payer: Self-pay

## 2024-03-14 MED ORDER — GLIPIZIDE 5 MG PO TABS: 5.0000 mg | ORAL_TABLET | Freq: Two times a day (BID) | ORAL | 1 refills | Status: AC

## 2024-03-14 NOTE — Telephone Encounter (Signed)
 Converted

## 2024-03-25 ENCOUNTER — Other Ambulatory Visit: Payer: Self-pay | Admitting: Family Medicine

## 2024-03-25 DIAGNOSIS — E1169 Type 2 diabetes mellitus with other specified complication: Secondary | ICD-10-CM

## 2024-03-26 NOTE — Telephone Encounter (Signed)
 Requested Prescriptions  Pending Prescriptions Disp Refills   atorvastatin  (LIPITOR) 20 MG tablet [Pharmacy Med Name: ATORVASTATIN  TABS 20MG ] 90 tablet 3    Sig: TAKE 1 TABLET DAILY     Cardiovascular:  Antilipid - Statins Failed - 03/26/2024 12:15 PM      Failed - Lipid Panel in normal range within the last 12 months    Cholesterol, Total  Date Value Ref Range Status  11/05/2023 144 100 - 199 mg/dL Final   LDL Chol Calc (NIH)  Date Value Ref Range Status  11/05/2023 77 0 - 99 mg/dL Final   HDL  Date Value Ref Range Status  11/05/2023 54 >39 mg/dL Final   Triglycerides  Date Value Ref Range Status  11/05/2023 64 0 - 149 mg/dL Final         Passed - Patient is not pregnant      Passed - Valid encounter within last 12 months    Recent Outpatient Visits           1 month ago Hypertension associated with diabetes (HCC)   [place] [facility] [place], Jon HERO, MD   4 months ago Type 2 diabetes mellitus with stage 2 chronic kidney disease, without long-term current use of insulin  (HCC)   [place] [facility] [place], Jon HERO, MD   5 months ago Type 2 diabetes mellitus with stage 2 chronic kidney disease, without long-term current use of insulin  [facility])   [place] [facility] [place], Jon HERO, MD   7 months ago Type 2 diabetes mellitus with stage 2 chronic kidney disease, without long-term current use of insulin  [facility])   [place] [facility] [place], Jon HERO, MD   7 months ago Dysuria   [place] [facility] Gas[place] Nancyann BRAVO, MD               furosemide  (LASIX ) 20 MG tablet [Pharmacy Med Name: FUROSEMIDE  TABS 20MG ] 90 tablet 3    Sig: TAKE 1 TABLET DAILY     Cardiovascular:  Diuretics - Loop Failed - 03/26/2024 12:15 PM      Failed - Mg Level in normal range and within 180 days    No results found for: MG       Passed - K in normal range and within 180 days     Potassium  Date Value Ref Range Status  11/05/2023 4.4 3.5 - 5.2 mmol/L Final         Passed - Ca in normal range and within 180 days    Calcium   Date Value Ref Range Status  11/05/2023 9.7 8.7 - 10.3 mg/dL Final         Passed - Na in normal range and within 180 days    Sodium  Date Value Ref Range Status  11/05/2023 137 134 - 144 mmol/L Final         Passed - Cr in normal range and within 180 days    Creatinine, Ser  Date Value Ref Range Status  11/05/2023 0.77 0.57 - 1.00 mg/dL Final         Passed - Cl in normal range and within 180 days    Chloride  Date Value Ref Range Status  11/05/2023 101 96 - 106 mmol/L Final         Passed - Last BP in normal range    BP Readings from Last 1 Encounters:  02/04/24 115/67  Passed - Valid encounter within last 6 months    Recent Outpatient Visits           1 month ago Hypertension associated with diabetes [facility])   [place] [facility] [place], Jon HERO, MD   4 months ago Type 2 diabetes mellitus with stage 2 chronic kidney disease, without long-term current use of insulin  [facility])   [place] [facility] [place], Jon HERO, MD   5 months ago Type 2 diabetes mellitus with stage 2 chronic kidney disease, without long-term current use of insulin  [facility])   [place] [facility] [place], Jon HERO, MD   7 months ago Type 2 diabetes mellitus with stage 2 chronic kidney disease, without long-term current use of insulin  [facility])   [place] [facility] [place], Jon HERO, MD   7 months ago Dysuria   [facility] Gas[place] Nancyann BRAVO, MD               lansoprazole  (PREVACID ) 30 MG capsule [Pharmacy Med Name: LANSOPRAZOLE  DR CAPS 30MG ] 90 capsule 3    Sig: TAKE 1 CAPSULE DAILY     Gastroenterology: Proton Pump Inhibitors 2 Passed - 03/26/2024 12:15 PM      Passed - ALT in normal range and within 360 days    ALT  Date Value Ref  Range Status  11/05/2023 10 0 - 32 IU/L Final         Passed - AST in normal range and within 360 days    AST  Date Value Ref Range Status  11/05/2023 11 0 - 40 IU/L Final         Passed - Valid encounter within last 12 months    Recent Outpatient Visits           1 month ago Hypertension associated with diabetes [facility])   [place] [facility] [place], Jon HERO, MD   4 months ago Type 2 diabetes mellitus with stage 2 chronic kidney disease, without long-term current use of insulin  [facility])   [place] [facility] [place], Jon HERO, MD   5 months ago Type 2 diabetes mellitus with stage 2 chronic kidney disease, without long-term current use of insulin  [facility])   [place] [facility] [place], Jon HERO, MD   7 months ago Type 2 diabetes mellitus with stage 2 chronic kidney disease, without long-term current use of insulin  [facility])   [place] [facility] [place], Jon HERO, MD   7 months ago Dysuria   [facility] Gas[place] Nancyann BRAVO, MD               levothyroxine  (SYNTHROID ) 100 MCG tablet [Pharmacy Med Name: L-THYROXINE (SYNTHROID ) TABS 100MCG] 90 tablet 3    Sig: TAKE 1 TABLET DAILY     Endocrinology:  Hypothyroid Agents Passed - 03/26/2024 12:15 PM      Passed - TSH in normal range and within 360 days    TSH  Date Value Ref Range Status  11/05/2023 0.855 0.450 - 4.500 uIU/mL Final         Passed - Valid encounter within last 12 months    Recent Outpatient Visits           1 month ago Hypertension associated with diabetes [facility])    [facility] [place], Jon HERO, MD   4 months ago Type 2 diabetes mellitus with stage 2 chronic kidney disease, without long-term current use  of insulin  [facility])   [place] [facility] [place], Jon HERO, MD   5 months ago Type 2 diabetes mellitus with stage 2 chronic kidney disease, without long-term  current use of insulin  [facility]   [place] [facility] [place], Jon HERO, MD   7 months ago Type 2 diabetes mellitus with stage 2 chronic kidney disease, without long-term current use of insulin  [facility])   [place] [facility] [place], Jon HERO, MD   7 months ago Dysuria   [facility] Gas[place] Nancyann BRAVO, MD

## 2024-03-31 ENCOUNTER — Ambulatory Visit: Admitting: Podiatry

## 2024-03-31 ENCOUNTER — Encounter: Payer: Self-pay | Admitting: Podiatry

## 2024-03-31 DIAGNOSIS — N182 Chronic kidney disease, stage 2 (mild): Secondary | ICD-10-CM

## 2024-03-31 DIAGNOSIS — E1122 Type 2 diabetes mellitus with diabetic chronic kidney disease: Secondary | ICD-10-CM | POA: Diagnosis not present

## 2024-03-31 DIAGNOSIS — M79676 Pain in unspecified toe(s): Secondary | ICD-10-CM | POA: Diagnosis not present

## 2024-03-31 DIAGNOSIS — B351 Tinea unguium: Secondary | ICD-10-CM

## 2024-04-06 ENCOUNTER — Encounter: Payer: Self-pay | Admitting: Podiatry

## 2024-04-06 NOTE — Progress Notes (Signed)
  Subjective:  Patient ID: Emma Fletcher, female    DOB: 1955/12/09,  MRN: [id number]  Emma Fletcher presents to clinic today for at risk foot care. Pt has h/o NIDDM with chronic kidney disease and painful mycotic toenails x 10 which interfere with daily activities. Pain is relieved with periodic professional debridement.  Chief Complaint  Patient presents with   Toe Pain    A1c 7.4. DR. Madeline is her PCP. She was last seen in August 2025   New problem(s): Patient states she stubbed her left foot and has bruising of left 2nd digit. States it's a little sore, but she is still able to move it.  PCP is Bacigalupo, Jon HERO, MD.  No Known Allergies  Review of Systems: Negative except as noted in the HPI.  Objective:  There were no vitals filed for this visit. Emma Fletcher is a pleasant 68 y.o. female WD, WN in NAD. AAO x 3.  Vascular Examination: Capillary refill time immediate b/l. Vascular status intact b/l with palpable pedal pulses. Pedal hair present b/l. No pain with calf compression b/l. Skin temperature gradient WNL b/l. No cyanosis or clubbing b/l. No ischemia or gangrene noted b/l. No edema noted b/l LE.  Neurological Examination: Sensation grossly intact b/l with 10 gram monofilament. Vibratory sensation intact b/l.   Dermatological Examination: Ecchymosis noted left 2nd digit. No breaks in skin, no erythema, no edema. Pedal skin with normal turgor, texture and tone b/l.  No open wounds. No interdigital macerations.   Toenails 1-5 b/l thick, discolored, elongated with subungual debris and pain on dorsal palpation.   Resolved porokeratotic lesion(s) 5th met base right foot. No erythema, no edema, no drainage, no fluctuance.  Musculoskeletal Examination: Left 2nd toe with normal active/passive ROM. Muscle strength 5/5 to all lower extremity muscle groups bilaterally. No pain, crepitus or joint limitation noted with ROM bilateral LE. No gross bony deformities  bilaterally.  Radiographs: None  Assessment/Plan: 1. Pain due to onychomycosis of toenail   2. Type 2 diabetes mellitus with stage 2 chronic kidney disease, without long-term current use of insulin  Emma Fletcher)   Consent given for treatment. Patient examined. All patient's and/or POA's questions/concerns addressed on today's visit. Offered patient appointment for work up of left 2nd toe, but she declines on today's visit. Toenails 1-5 b/l debrided in length and girth without incident. Continue foot and shoe inspections daily. Monitor blood glucose per PCP/Endocrinologist's recommendations. Continue soft, supportive shoe gear daily. Report any pedal injuries to medical professional. Call office if there are any questions/concerns.  Return in about 3 months (around 07/01/2024).  Emma Fletcher, DPM      [place] LOCATION: [address], [place] [address]                   Office [phone]   [facility] LOCATION: [address] [place], [place] [address] Office [phone]

## 2024-04-07 ENCOUNTER — Other Ambulatory Visit: Payer: Self-pay | Admitting: Family Medicine

## 2024-04-29 LAB — OPHTHALMOLOGY REPORT-SCANNED

## 2024-05-04 ENCOUNTER — Other Ambulatory Visit: Payer: Self-pay | Admitting: Family Medicine

## 2024-05-04 DIAGNOSIS — E1122 Type 2 diabetes mellitus with diabetic chronic kidney disease: Secondary | ICD-10-CM

## 2024-05-08 ENCOUNTER — Encounter: Payer: Self-pay | Admitting: Family Medicine

## 2024-05-08 ENCOUNTER — Telehealth: Payer: Self-pay | Admitting: Family Medicine

## 2024-05-08 DIAGNOSIS — E1122 Type 2 diabetes mellitus with diabetic chronic kidney disease: Secondary | ICD-10-CM

## 2024-05-08 MED ORDER — EMPAGLIFLOZIN 25 MG PO TABS: 25.0000 mg | ORAL_TABLET | Freq: Every day | ORAL | 1 refills | Status: AC

## 2024-05-08 NOTE — Telephone Encounter (Signed)
 [facility] faxed refill request for the following medications:  JARDIANCE  25 MG TABS tablet     Please advise.

## 2024-05-19 ENCOUNTER — Encounter: Payer: Self-pay | Admitting: Family Medicine

## 2024-05-19 ENCOUNTER — Ambulatory Visit: Admitting: Family Medicine

## 2024-05-19 VITALS — BP 128/70 | HR 67 | Resp 16 | Ht 65.0 in | Wt 163.0 lb

## 2024-05-19 DIAGNOSIS — E1129 Type 2 diabetes mellitus with other diabetic kidney complication: Secondary | ICD-10-CM

## 2024-05-19 DIAGNOSIS — N182 Chronic kidney disease, stage 2 (mild): Secondary | ICD-10-CM

## 2024-05-19 DIAGNOSIS — E1122 Type 2 diabetes mellitus with diabetic chronic kidney disease: Secondary | ICD-10-CM

## 2024-05-19 DIAGNOSIS — E1159 Type 2 diabetes mellitus with other circulatory complications: Secondary | ICD-10-CM

## 2024-05-19 DIAGNOSIS — J449 Chronic obstructive pulmonary disease, unspecified: Secondary | ICD-10-CM

## 2024-05-19 DIAGNOSIS — E1169 Type 2 diabetes mellitus with other specified complication: Secondary | ICD-10-CM

## 2024-05-19 DIAGNOSIS — E039 Hypothyroidism, unspecified: Secondary | ICD-10-CM

## 2024-05-19 DIAGNOSIS — E663 Overweight: Secondary | ICD-10-CM

## 2024-05-19 MED ORDER — FLUTICASONE-SALMETEROL 100-50 MCG/ACT IN AEPB: 1.0000 | INHALATION_SPRAY | Freq: Two times a day (BID) | RESPIRATORY_TRACT | 5 refills | Status: AC

## 2024-05-19 NOTE — Progress Notes (Signed)
 Established patient visit   Patient: Emma Fletcher   DOB: 05-13-1956   67 y.o. Female  MRN: [id number] Visit Date: 05/19/2024  Today's healthcare provider: Jon Eva, MD   Chief Complaint  Patient presents with   Follow-up    3 Month f/u.SABRAPt wants to discuss different nhaler due to insurance   Subjective    HPI HPI     Follow-up    Additional comments: 3 Month f/u.SABRAPt wants to discuss different nhaler due to insurance      Last edited by Marylen Odella CROME, CMA on 05/19/2024  8:28 AM.       Discussed the use of AI scribe software for clinical note transcription with the patient, who gave verbal consent to proceed.  History of Present Illness   Emma Fletcher is a 68 year old female with diabetes, hypertension, hyperlipidemia, microalbuminuria, hypothyroidism, and CKD stage 2 who presents for chronic follow-up.  She takes atorvastatin  20 mg daily, Jardiance  25 mg daily, Qulipta one puff daily, Lasix  20 mg daily, glipizide  5 mg BID, Synthroid  100 mcg daily, lisinopril  20 mg daily, metformin  XR 1000 mg with breakfast and 500 mg at bedtime, and Ozempic  2 mg weekly.  Her home blood sugars are usually around 116 mg/dL with some values in the 140s to 150s. Her last A1c was 7.4%.  She has had no recent respiratory flare-ups and feels well controlled on Breo.  She is current on flu and pneumonia vaccines, with pneumonia vaccination in 2023, and is up to date on diabetic foot and eye exams.        Medications: Outpatient Medications Prior to Visit  Medication Sig   ascorbic acid  (VITAMIN C ) 500 MG tablet Take 500 mg by mouth daily.   atorvastatin  (LIPITOR) 20 MG tablet TAKE 1 TABLET DAILY   Blood Glucose Monitoring Suppl (ONE TOUCH ULTRA 2) w/Device KIT Use as directed to check blood sugar once daily   Cholecalciferol  (VITAMIN D3) 125 MCG (5000 UT) CAPS Take 1 capsule (5,000 Units total) by mouth daily. TAKE 1 CAPSULE EVERY DAY   empagliflozin  (JARDIANCE ) 25  MG TABS tablet Take 1 tablet (25 mg total) by mouth daily.   Ferrous Sulfate (IRON) 325 (65 Fe) MG TABS Take 2 tablets by mouth daily.   furosemide  (LASIX ) 20 MG tablet TAKE 1 TABLET DAILY   glipiZIDE  (GLUCOTROL ) 5 MG tablet Take 1 tablet (5 mg total) by mouth 2 (two) times daily before a meal.   glucose blood (ONE TOUCH ULTRA TEST) test strip Use as instructed to check blood glucose daily   lansoprazole  (PREVACID ) 30 MG capsule TAKE 1 CAPSULE DAILY   levothyroxine  (SYNTHROID ) 100 MCG tablet TAKE 1 TABLET DAILY   lisinopril  (ZESTRIL ) 20 MG tablet TAKE 1 TABLET DAILY   metFORMIN  (GLUCOPHAGE -XR) 500 MG 24 hr tablet Take 2 tablets (1,000 mg total) by mouth daily with breakfast AND 1 tablet (500 mg total) at bedtime.   Multiple Vitamins-Minerals (ONE-A-DAY WOMENS 50 PLUS PO) Take 1 tablet by mouth daily.   OneTouch Delica Lancets 33G MISC Use as directed to check blood glucose daily   polyethylene glycol powder (GLYCOLAX /MIRALAX ) 17 GM/SCOOP powder Please use one cup of miralax  daily.   Semaglutide , 2 MG/DOSE, (OZEMPIC , 2 MG/DOSE,) 8 MG/3ML SOPN Inject 2 mg into the skin once a week.   [DISCONTINUED] fluticasone  furoate-vilanterol (BREO ELLIPTA ) 100-25 MCG/ACT AEPB Inhale 1 puff into the lungs daily.   No facility-administered medications prior to visit.    Review  of Systems     Objective    BP 128/70 (BP Location: Left Arm, Patient Position: Sitting)   Pulse 67   Resp 16   Ht 5' 5 (1.651 m)   Wt 163 lb (73.9 kg)   LMP  (LMP Unknown)   SpO2 100%   BMI 27.12 kg/m    Physical Exam Vitals reviewed.  Constitutional:      General: She is not in acute distress.    Appearance: Normal appearance. She is well-developed. She is not diaphoretic.  HENT:     Head: Normocephalic and atraumatic.  Eyes:     General: No scleral icterus.    Conjunctiva/sclera: Conjunctivae normal.  Neck:     Thyroid : No thyromegaly.  Cardiovascular:     Rate and Rhythm: Normal rate and regular rhythm.      Heart sounds: Normal heart sounds. No murmur heard. Pulmonary:     Effort: Pulmonary effort is normal. No respiratory distress.     Breath sounds: Normal breath sounds. No wheezing, rhonchi or rales.  Musculoskeletal:     Cervical back: Neck supple.     Right lower leg: No edema.     Left lower leg: No edema.  Lymphadenopathy:     Cervical: No cervical adenopathy.  Skin:    General: Skin is warm and dry.     Findings: No rash.  Neurological:     Mental Status: She is alert and oriented to person, place, and time. Mental status is at baseline.  Psychiatric:        Mood and Affect: Mood normal.        Behavior: Behavior normal.      No results found for any visits on 05/19/24.  Assessment & Plan     Problem List Items Addressed This Visit       Cardiovascular and Mediastinum   Hypertension associated with diabetes (HCC)   Relevant Orders   Comprehensive metabolic panel with GFR     Respiratory   Chronic obstructive pulmonary disease (HCC)   Current inhaler (Breo) is not covered by insurance. No recent flare-ups reported. Advair is recommended as an alternative, which contains a steroid and a long-acting bronchodilator. - Prescribed Advair as an alternative inhaler - Instructed to fill Advair prescription and use Breo as a backup until Advair is confirmed covered - Educated on proper use of Advair, including keeping it horizontal during inhalation      Relevant Medications   fluticasone -salmeterol (ADVAIR DISKUS) 100-50 MCG/ACT AEPB     Endocrine   T2DM (type 2 diabetes mellitus) (HCC) - Primary   Relevant Orders   Hemoglobin A1c   Hyperlipidemia associated with type 2 diabetes mellitus (HCC)   Relevant Orders   Comprehensive metabolic panel with GFR   Lipid panel   Hypothyroidism   Thyroid  function has not been checked since May. - Ordered thyroid  function tests      Relevant Orders   TSH   Microalbuminuria due to type 2 diabetes mellitus (HCC)      Genitourinary   CKD (chronic kidney disease), stage II   Relevant Orders   Comprehensive metabolic panel with GFR     Other   Overweight       Type 2 diabetes mellitus with stage 2 chronic kidney disease, microalbuminuria, hyperlipidemia, and hypertension Blood pressure is well-controlled at 128/70 mmHg. Blood glucose levels are generally good, with a recent reading of 116 mg/dL. Previous A1c was 7.4%, with a target of under 7%. Labs for cholesterol,  thyroid , kidney, and liver function have not been checked since May. - Ordered A1c, kidney and liver function, cholesterol, and thyroid  labs - Continue current diabetes medications until lab results are available - Scheduled follow-up in 3 months to reassess A1c and adjust medications if necessary  Overweight Weight management is ongoing with a focus on diet and exercise.  General Health Maintenance [facility] Pneumonia vaccination is up to date as of 2023. - Continue routine health maintenance and vaccinations as needed       Return in about 3 months (around 08/17/2024) for chronic disease f/u.       Jon Eva, MD  [facility] [phone] (phone) [phone] (fax)  [facility]

## 2024-05-19 NOTE — Assessment & Plan Note (Signed)
 Current inhaler (Breo) is not covered by insurance. No recent flare-ups reported. Advair is recommended as an alternative, which contains a steroid and a long-acting bronchodilator. - Prescribed Advair as an alternative inhaler - Instructed to fill Advair prescription and use Breo as a backup until Advair is confirmed covered - Educated on proper use of Advair, including keeping it horizontal during inhalation

## 2024-05-19 NOTE — Assessment & Plan Note (Signed)
 Thyroid  function has not been checked since May. - Ordered thyroid  function tests

## 2024-05-20 LAB — LIPID PANEL
Chol/HDL Ratio: 3 ratio (ref 0.0–4.4)
Cholesterol, Total: 170 mg/dL (ref 100–199)
HDL: 57 mg/dL (ref >39)
LDL Chol Calc (NIH): 94 mg/dL (ref 0–99)
Triglycerides: 103 mg/dL (ref 0–149)
VLDL Cholesterol Cal: 19 mg/dL (ref 5–40)

## 2024-05-20 LAB — COMPREHENSIVE METABOLIC PANEL WITH GFR
ALT: 12 IU/L (ref 0–32)
AST: 14 IU/L (ref 0–40)
Albumin: 4.1 g/dL (ref 3.9–4.9)
Alkaline Phosphatase: 160 IU/L — ABNORMAL HIGH (ref 49–135)
BUN/Creatinine Ratio: 29 — ABNORMAL HIGH (ref 12–28)
BUN: 26 mg/dL (ref 8–27)
Bilirubin Total: 0.3 mg/dL (ref 0.0–1.2)
CO2: 19 mmol/L — ABNORMAL LOW (ref 20–29)
Calcium: 9.8 mg/dL (ref 8.7–10.3)
Chloride: 101 mmol/L (ref 96–106)
Creatinine, Ser: 0.91 mg/dL (ref 0.57–1.00)
Globulin, Total: 2.4 g/dL (ref 1.5–4.5)
Glucose: 148 mg/dL — ABNORMAL HIGH (ref 70–99)
Potassium: 4.6 mmol/L (ref 3.5–5.2)
Sodium: 135 mmol/L (ref 134–144)
Total Protein: 6.5 g/dL (ref 6.0–8.5)
eGFR: 69 mL/min/1.73 (ref >59)

## 2024-05-20 LAB — HEMOGLOBIN A1C
Est. average glucose Bld gHb Est-mCnc: 171 mg/dL
Hgb A1c MFr Bld: 7.6 % — ABNORMAL HIGH (ref 4.8–5.6)

## 2024-05-20 LAB — TSH: TSH: 1.01 u[IU]/mL (ref 0.450–4.500)

## 2024-05-21 ENCOUNTER — Other Ambulatory Visit: Payer: Self-pay

## 2024-05-21 ENCOUNTER — Ambulatory Visit: Payer: Self-pay

## 2024-05-21 DIAGNOSIS — E1122 Type 2 diabetes mellitus with diabetic chronic kidney disease: Secondary | ICD-10-CM

## 2024-05-21 MED ORDER — METFORMIN HCL ER 500 MG PO TB24: 1000.0000 mg | ORAL_TABLET | Freq: Two times a day (BID) | ORAL | 3 refills | Status: AC

## 2024-05-22 ENCOUNTER — Telehealth: Payer: Self-pay | Admitting: Pharmacy Technician

## 2024-05-22 ENCOUNTER — Other Ambulatory Visit (HOSPITAL_COMMUNITY): Payer: Self-pay

## 2024-05-22 NOTE — Telephone Encounter (Signed)
 Pharmacy Patient Advocate Encounter   Received notification from Onbase that prior authorization for Fluticasone -Salmeterol 100-50MCG/ACT aerosol powder   is required/requested.   Insurance verification completed.   The patient is insured through [employer].   Per test claim: PA required; PA started via CoverMyMeds. KEY [id number] . Please see clinical question(s) below that I am not finding the answer to in their chart and advise.  Chart notes indicated that [name] was not covered. According to her insurance Breo and Breyna  are the preferred alternatives. I ran a test claim for both and both process at a $0.00 copay.

## 2024-05-26 ENCOUNTER — Telehealth: Payer: Self-pay | Admitting: Family Medicine

## 2024-05-26 NOTE — Telephone Encounter (Signed)
 Per [facility] on [address]    Fluticasone /salm disk 100/48mcg 60 S is not covered by patient insurance.  Preferred alternative is Advair HFA or Breo Ellipta 

## 2024-05-26 NOTE — Telephone Encounter (Signed)
 She had letter from insurance stating that after Jan 1st, Emma Fletcher will no longer be covered.  Can we advise her to continue Breo for now and try to re-process PA in January?

## 2024-05-26 NOTE — Telephone Encounter (Signed)
 Called and spoke with patient's sister advised her of what the provider stated.  Verbalized understanding.

## 2024-05-27 ENCOUNTER — Other Ambulatory Visit (HOSPITAL_COMMUNITY): Payer: Self-pay

## 2024-05-27 NOTE — Telephone Encounter (Signed)
 She other phone note with the PA team. She may need to stay on Breo until the end of the year and then we can get Advair approved when the formulary changes on the insurance

## 2024-05-28 ENCOUNTER — Ambulatory Visit
Admission: EM | Admit: 2024-05-28 | Discharge: 2024-05-28 | Disposition: A | Discharge status: Home / Self Care | Attending: Emergency Medicine | Admitting: Emergency Medicine

## 2024-05-28 DIAGNOSIS — J101 Influenza due to other identified influenza virus with other respiratory manifestations: Secondary | ICD-10-CM

## 2024-05-28 LAB — POCT RAPID STREP A (OFFICE): Rapid Strep A Screen: NEGATIVE

## 2024-05-28 LAB — POC COVID19/FLU A&B COMBO
Covid Antigen, POC: NEGATIVE
Influenza A Antigen, POC: POSITIVE — AB
Influenza B Antigen, POC: NEGATIVE

## 2024-05-28 MED ORDER — OSELTAMIVIR PHOSPHATE 75 MG PO CAPS: 75.0000 mg | ORAL_CAPSULE | Freq: Two times a day (BID) | ORAL | 0 refills | Status: AC

## 2024-05-28 NOTE — ED Notes (Signed)
 Patient triage by provider Teresa Shelba SAUNDERS, NP

## 2024-05-28 NOTE — ED Provider Notes (Signed)
 Emma Fletcher    CSN: [id number] Arrival date & time: 05/28/24  1856      History   Chief Complaint No chief complaint on file.   HPI Emma Fletcher is a 68 y.o. female.   Patient presents for evaluation of nasal congestion, left-sided ear pain, sore throat and a productive cough with yellow sputum present for 2 days.  Has been experience soreness behind the eyes but denies pruritus pain or drainage.  Tolerable to food and liquids.  No known sick contacts.    Past Medical History:  Diagnosis Date   Anemia    Bronchitis    Cardiomyopathy secondary    Developmental delay    Diabetes mellitus without complication (HCC)    Edema    Hypercholesterolemia    Unspecified essential hypertension     Patient Active Problem List   Diagnosis Date Noted   Microalbuminuria due to type 2 diabetes mellitus (HCC) 02/05/2023   Peroneal tendinitis, right 07/18/2021   Overweight 07/11/2021   Postprandial abdominal pain in right upper quadrant 07/11/2021   Peroneal tendinitis, right leg 04/25/2021   Callus 07/26/2020   Allergic rhinitis 05/20/2020   Trochanteric bursitis of right hip 02/26/2020   Bone pain 09/15/2019   Chronic obstructive pulmonary disease (HCC) 06/23/2019   Hyperlipidemia associated with type 2 diabetes mellitus (HCC) 09/20/2017   Hypothyroidism 09/20/2017   CKD (chronic kidney disease), stage II 05/16/2016   T2DM (type 2 diabetes mellitus) (HCC) 12/18/2015   Anemia of chronic disease 12/18/2015   Hypertension associated with diabetes (HCC) 11/10/2008   Secondary cardiomyopathy (HCC) 11/10/2008    Past Surgical History:  Procedure Laterality Date   COLONOSCOPY     COLONOSCOPY WITH PROPOFOL  N/A 04/24/2018   Procedure: COLONOSCOPY WITH PROPOFOL ;  Surgeon: Therisa Bi, MD;  Location: [facility] ENDOSCOPY;  Service: Gastroenterology;  Laterality: N/A;   none      OB History     Gravida  0   Para  0   Term  0   Preterm  0   AB  0   Living  0       SAB  0   IAB  0   Ectopic  0   Multiple  0   Live Births  0            Home Medications    Prior to Admission medications   Medication Sig Start Date End Date Taking? Authorizing Provider  ascorbic acid  (VITAMIN C ) 500 MG tablet Take 500 mg by mouth daily.    [provider]  atorvastatin  (LIPITOR) 20 MG tablet TAKE 1 TABLET DAILY 03/26/24   Bacigalupo, Angela M, MD  Blood Glucose Monitoring Suppl (ONE TOUCH ULTRA 2) w/Device KIT Use as directed to check blood sugar once daily 03/26/18   Bacigalupo, Angela M, MD  Cholecalciferol  (VITAMIN D3) 125 MCG (5000 UT) CAPS Take 1 capsule (5,000 Units total) by mouth daily. TAKE 1 CAPSULE EVERY DAY 10/24/22   Bacigalupo, Jon HERO, MD  empagliflozin  (JARDIANCE ) 25 MG TABS tablet Take 1 tablet (25 mg total) by mouth daily. 05/08/24   Bacigalupo, Angela M, MD  Ferrous Sulfate (IRON) 325 (65 Fe) MG TABS Take 2 tablets by mouth daily.    [provider]  fluticasone -salmeterol (ADVAIR DISKUS) 100-50 MCG/ACT AEPB Inhale 1 puff into the lungs 2 (two) times daily. 05/19/24   Myrla Jon HERO, MD  furosemide  (LASIX ) 20 MG tablet TAKE 1 TABLET DAILY 03/26/24   Myrla Jon HERO, MD  glipiZIDE  (GLUCOTROL ) 5 MG tablet Take 1 tablet (5 mg total) by mouth 2 (two) times daily before a meal. 03/14/24   Bacigalupo, Jon HERO, MD  glucose blood (ONE TOUCH ULTRA TEST) test strip Use as instructed to check blood glucose daily 03/26/18   Myrla Jon HERO, MD  lansoprazole  (PREVACID ) 30 MG capsule TAKE 1 CAPSULE DAILY 03/26/24   Myrla Jon HERO, MD  levothyroxine  (SYNTHROID ) 100 MCG tablet TAKE 1 TABLET DAILY 03/26/24   Myrla Jon HERO, MD  lisinopril  (ZESTRIL ) 20 MG tablet TAKE 1 TABLET DAILY 04/07/24   Bacigalupo, Angela M, MD  metFORMIN  (GLUCOPHAGE -XR) 500 MG 24 hr tablet Take 2 tablets (1,000 mg total) by mouth 2 (two) times daily with a meal. 05/21/24   Franchot Isaiah LABOR, MD  Multiple Vitamins-Minerals (ONE-A-DAY WOMENS 50 PLUS  PO) Take 1 tablet by mouth daily.    [provider]  OneTouch Delica Lancets 33G MISC Use as directed to check blood glucose daily 07/31/22   Bacigalupo, Angela M, MD  polyethylene glycol powder (GLYCOLAX /MIRALAX ) 17 GM/SCOOP powder Please use one cup of miralax  daily. 02/26/19   Tahiliani, Varnita B, MD  Semaglutide , 2 MG/DOSE, (OZEMPIC , 2 MG/DOSE,) 8 MG/3ML SOPN Inject 2 mg into the skin once a week. 02/04/24   Myrla Jon HERO, MD    Family History Family History  Problem Relation Age of Onset   Diabetes Mother    Hypertension Mother    Stroke Mother    Diabetes Father    Coronary artery disease Father    Breast cancer Maternal Aunt    Lung cancer Maternal Uncle    Prostate cancer Paternal Uncle    Uterine cancer Other     Social History Social History   Tobacco Use   Smoking status: Never   Smokeless tobacco: Never  Vaping Use   Vaping status: Never Used  Substance Use Topics   Alcohol  use: No    Alcohol /week: 0.0 standard drinks of alcohol    Drug use: No     Allergies   Patient has no known allergies.   Review of Systems Review of Systems   Physical Exam Triage Vital Signs ED Triage Vitals  Encounter Vitals Group     BP      Girls Systolic BP Percentile      Girls Diastolic BP Percentile      Boys Systolic BP Percentile      Boys Diastolic BP Percentile      Pulse      Resp      Temp      Temp src      SpO2      Weight      Height      Head Circumference      Peak Flow      Pain Score      Pain Loc      Pain Education      Exclude from Growth Chart    No data found.  Updated Vital Signs LMP  (LMP Unknown)   Visual Acuity Right Eye Distance:   Left Eye Distance:   Bilateral Distance:    Right Eye Near:   Left Eye Near:    Bilateral Near:     Physical Exam Constitutional:      Appearance: Normal appearance.  HENT:     Head: Normocephalic.     Right Ear: Tympanic membrane, ear canal and external ear normal.     Left  Ear: Tympanic membrane, ear canal and external  ear normal.     Nose: Congestion present.     Mouth/Throat:     Mouth: Mucous membranes are moist.     Pharynx: Oropharynx is clear. No oropharyngeal exudate or posterior oropharyngeal erythema.     Tonsils: Tonsillar exudate present. 1+ on the right. 1+ on the left.  Cardiovascular:     Rate and Rhythm: Normal rate and regular rhythm.     Pulses: Normal pulses.     Heart sounds: Normal heart sounds.  Pulmonary:     Effort: Pulmonary effort is normal.     Breath sounds: Normal breath sounds.  Musculoskeletal:     Cervical back: Normal range of motion and neck supple.  Neurological:     Mental Status: She is alert and oriented to person, place, and time. Mental status is at baseline.      UC Treatments / Results  Labs (all labs ordered are listed, but only abnormal results are displayed) Labs Reviewed  POC COVID19/FLU A&B COMBO    EKG   Radiology No results found.  Procedures Procedures (including critical care time)  Medications Ordered in UC Medications - No data to display  Initial Impression / Assessment and Plan / UC Course  I have reviewed the triage vital signs and the nursing notes.  Pertinent labs & imaging results that were available during my care of the patient were reviewed by me and considered in my medical decision making (see chart for details).  Influenza A  Patient is in no signs of distress nor toxic appearing.  Vital signs are stable.  Low suspicion for pneumonia, pneumothorax or bronchitis and therefore will defer imaging.  COVID and strep test negative.  Prescribed Tamiflu.May use additional over-the-counter medications as needed for supportive care.  May follow-up with urgent care as needed if symptoms persist or worsen.  Final Clinical Impressions(s) / UC Diagnoses   Final diagnoses:  Viral illness   Discharge Instructions   None    ED Prescriptions   None    PDMP not reviewed this  encounter.   Teresa Shelba SAUNDERS, NP 05/28/24 [phone]

## 2024-05-28 NOTE — Discharge Instructions (Signed)
 Influenza A is a virus and should steadily improve in time it can take up to 7 to 10 days before you truly start to see a turnaround however things will get better  Begin Tamiflu every morning and every evening for 5 days to reduce the amount of virus in the body which helps to minimize symptoms    You can take Tylenol and/or Ibuprofen as needed for fever reduction and pain relief.   For cough: honey 1/2 to 1 teaspoon (you can dilute the honey in water or another fluid).  You can also use guaifenesin and dextromethorphan for cough. You can use a humidifier for chest congestion and cough.  If you don't have a humidifier, you can sit in the bathroom with the hot shower running.      For sore throat: try warm salt water gargles, cepacol lozenges, throat spray, warm tea or water with lemon/honey, popsicles or ice, or OTC cold relief medicine for throat discomfort.   For congestion: take a daily anti-histamine like Zyrtec, Claritin, and a oral decongestant, such as pseudoephedrine.  You can also use Flonase 1-2 sprays in each nostril daily.   It is important to stay hydrated: drink plenty of fluids (water, gatorade/powerade/pedialyte, juices, or teas) to keep your throat moisturized and help further relieve irritation/discomfort.

## 2024-05-29 ENCOUNTER — Ambulatory Visit: Payer: Self-pay

## 2024-05-29 NOTE — Telephone Encounter (Signed)
 Called and spoke with patient's sister per DPR, relayed message and she verbalized understanding.

## 2024-06-01 ENCOUNTER — Other Ambulatory Visit: Payer: Self-pay

## 2024-06-01 ENCOUNTER — Ambulatory Visit
Admission: EM | Admit: 2024-06-01 | Discharge: 2024-06-01 | Disposition: A | Discharge status: Home / Self Care | Attending: Emergency Medicine | Admitting: Emergency Medicine

## 2024-06-01 DIAGNOSIS — J029 Acute pharyngitis, unspecified: Secondary | ICD-10-CM

## 2024-06-01 DIAGNOSIS — J101 Influenza due to other identified influenza virus with other respiratory manifestations: Secondary | ICD-10-CM

## 2024-06-01 LAB — POCT RAPID STREP A (OFFICE): Rapid Strep A Screen: NEGATIVE

## 2024-06-01 NOTE — ED Triage Notes (Signed)
 Pt being seen in UC for sore throat, nasal congestion and bilateral ear pain. Pt reports recently being diagnosed with flu a. Pt denies fevers. Pt reports taking tamiflu  and mucinex.

## 2024-06-01 NOTE — ED Provider Notes (Signed)
 Emma Fletcher    CSN: [id number] Arrival date & time: 06/01/24  1238      History   Chief Complaint Chief Complaint  Patient presents with   Sore Throat   Otalgia   Nasal Congestion    HPI [name] is a 68 y.o. female.  Accompanied by her sisters, patient presents with sore throat, ear pain, congestion.  She is currently on Tamiflu  and has also been taking Mucinex.  No fever, shortness of breath, vomiting, diarrhea.  Patient was seen at this urgent care on 05/28/2024; diagnosed with influenza A; treated with Tamiflu .  The history is provided by the patient, a relative and medical records.    Past Medical History:  Diagnosis Date   Anemia    Bronchitis    Cardiomyopathy secondary    Developmental delay    Diabetes mellitus without complication (HCC)    Edema    Hypercholesterolemia    Unspecified essential hypertension     Patient Active Problem List   Diagnosis Date Noted   Microalbuminuria due to type 2 diabetes mellitus (HCC) 02/05/2023   Peroneal tendinitis, right 07/18/2021   Overweight 07/11/2021   Postprandial abdominal pain in right upper quadrant 07/11/2021   Peroneal tendinitis, right leg 04/25/2021   Callus 07/26/2020   Allergic rhinitis 05/20/2020   Trochanteric bursitis of right hip 02/26/2020   Bone pain 09/15/2019   Chronic obstructive pulmonary disease (HCC) 06/23/2019   Hyperlipidemia associated with type 2 diabetes mellitus (HCC) 09/20/2017   Hypothyroidism 09/20/2017   CKD (chronic kidney disease), stage II 05/16/2016   T2DM (type 2 diabetes mellitus) (HCC) 12/18/2015   Anemia of chronic disease 12/18/2015   Hypertension associated with diabetes (HCC) 11/10/2008   Secondary cardiomyopathy (HCC) 11/10/2008    Past Surgical History:  Procedure Laterality Date   COLONOSCOPY     COLONOSCOPY WITH PROPOFOL  N/A 04/24/2018   Procedure: COLONOSCOPY WITH PROPOFOL ;  Surgeon: Therisa Bi, MD;  Location: [facility] ENDOSCOPY;  Service:  Gastroenterology;  Laterality: N/A;   none      OB History     Gravida  0   Para  0   Term  0   Preterm  0   AB  0   Living  0      SAB  0   IAB  0   Ectopic  0   Multiple  0   Live Births  0            Home Medications    Prior to Admission medications  Medication Sig Start Date End Date Taking? Authorizing Provider  ascorbic acid  (VITAMIN C ) 500 MG tablet Take 500 mg by mouth daily.    [provider]  atorvastatin  (LIPITOR) 20 MG tablet TAKE 1 TABLET DAILY 03/26/24   Bacigalupo, Angela M, MD  Blood Glucose Monitoring Suppl (ONE TOUCH ULTRA 2) w/Device KIT Use as directed to check blood sugar once daily 03/26/18   Bacigalupo, Angela M, MD  Cholecalciferol  (VITAMIN D3) 125 MCG (5000 UT) CAPS Take 1 capsule (5,000 Units total) by mouth daily. TAKE 1 CAPSULE EVERY DAY 10/24/22   Bacigalupo, Jon HERO, MD  empagliflozin  (JARDIANCE ) 25 MG TABS tablet Take 1 tablet (25 mg total) by mouth daily. 05/08/24   Bacigalupo, Angela M, MD  Ferrous Sulfate (IRON) 325 (65 Fe) MG TABS Take 2 tablets by mouth daily.    [provider]  fluticasone -salmeterol (ADVAIR DISKUS) 100-50 MCG/ACT AEPB Inhale 1 puff into the lungs 2 (two) times daily.  05/19/24   Bacigalupo, Angela M, MD  furosemide  (LASIX ) 20 MG tablet TAKE 1 TABLET DAILY 03/26/24   Bacigalupo, Angela M, MD  glipiZIDE  (GLUCOTROL ) 5 MG tablet Take 1 tablet (5 mg total) by mouth 2 (two) times daily before a meal. 03/14/24   Bacigalupo, Jon HERO, MD  glucose blood (ONE TOUCH ULTRA TEST) test strip Use as instructed to check blood glucose daily 03/26/18   Bacigalupo, Angela M, MD  lansoprazole  (PREVACID ) 30 MG capsule TAKE 1 CAPSULE DAILY 03/26/24   Myrla Jon HERO, MD  levothyroxine  (SYNTHROID ) 100 MCG tablet TAKE 1 TABLET DAILY 03/26/24   Myrla Jon HERO, MD  lisinopril  (ZESTRIL ) 20 MG tablet TAKE 1 TABLET DAILY 04/07/24   Bacigalupo, Angela M, MD  metFORMIN  (GLUCOPHAGE -XR) 500 MG 24 hr tablet Take 2 tablets  (1,000 mg total) by mouth 2 (two) times daily with a meal. 05/21/24   Franchot Isaiah LABOR, MD  Multiple Vitamins-Minerals (ONE-A-DAY WOMENS 50 PLUS PO) Take 1 tablet by mouth daily.    [provider]  OneTouch Delica Lancets 33G MISC Use as directed to check blood glucose daily 07/31/22   Bacigalupo, Angela M, MD  oseltamivir  (TAMIFLU ) 75 MG capsule Take 1 capsule (75 mg total) by mouth every 12 (twelve) hours. 05/28/24   White, Shelba SAUNDERS, NP  polyethylene glycol powder (GLYCOLAX /MIRALAX ) 17 GM/SCOOP powder Please use one cup of miralax  daily. 02/26/19   Tahiliani, Varnita B, MD  Semaglutide , 2 MG/DOSE, (OZEMPIC , 2 MG/DOSE,) 8 MG/3ML SOPN Inject 2 mg into the skin once a week. 02/04/24   Myrla Jon HERO, MD    Family History Family History  Problem Relation Age of Onset   Diabetes Mother    Hypertension Mother    Stroke Mother    Diabetes Father    Coronary artery disease Father    Breast cancer Maternal Aunt    Lung cancer Maternal Uncle    Prostate cancer Paternal Uncle    Uterine cancer Other     Social History Social History[1]   Allergies   Patient has no known allergies.   Review of Systems Review of Systems  Constitutional:  Negative for chills and fever.  HENT:  Positive for congestion, ear pain and sore throat.   Respiratory:  Positive for cough. Negative for shortness of breath.   Gastrointestinal:  Negative for diarrhea and vomiting.     Physical Exam Triage Vital Signs ED Triage Vitals  Encounter Vitals Group     BP 06/01/24 1334 118/84     Girls Systolic BP Percentile --      Girls Diastolic BP Percentile --      Boys Systolic BP Percentile --      Boys Diastolic BP Percentile --      Pulse Rate 06/01/24 1334 79     Resp 06/01/24 1334 18     Temp 06/01/24 1334 98 F (36.7 C)     Temp Source 06/01/24 1334 Temporal     SpO2 06/01/24 1334 99 %     Weight --      Height --      Head Circumference --      Peak Flow --      Pain Score 06/01/24  1332 5     Pain Loc --      Pain Education --      Exclude from Growth Chart --    No data found.  Updated Vital Signs BP 118/84 (BP Location: Left Arm)   Pulse 79  Temp 98 F (36.7 C) (Temporal)   Resp 18   LMP  (LMP Unknown)   SpO2 99%   Visual Acuity Right Eye Distance:   Left Eye Distance:   Bilateral Distance:    Right Eye Near:   Left Eye Near:    Bilateral Near:     Physical Exam Constitutional:      General: She is not in acute distress. HENT:     Right Ear: Tympanic membrane normal.     Left Ear: Tympanic membrane normal.     Nose: Nose normal.     Mouth/Throat:     Mouth: Mucous membranes are moist.     Pharynx: Posterior oropharyngeal erythema present.  Cardiovascular:     Rate and Rhythm: Normal rate and regular rhythm.     Heart sounds: Normal heart sounds.  Pulmonary:     Effort: Pulmonary effort is normal. No respiratory distress.     Breath sounds: Normal breath sounds.  Neurological:     Mental Status: She is alert.      UC Treatments / Results  Labs (all labs ordered are listed, but only abnormal results are displayed) Labs Reviewed  POCT RAPID STREP A (OFFICE)    EKG   Radiology No results found.  Procedures Procedures (including critical care time)  Medications Ordered in UC Medications - No data to display  Initial Impression / Assessment and Plan / UC Course  I have reviewed the triage vital signs and the nursing notes.  Pertinent labs & imaging results that were available during my care of the patient were reviewed by me and considered in my medical decision making (see chart for details).    Influenza A, sore throat.  Afebrile and vital signs are stable.  Lungs are clear and O2 sat is 99% on room air.  Rapid strep negative.  Patient is currently on Tamiflu .  Instructed her to continue this medication and continue symptomatic treatment such as Tylenol  or ibuprofen.  Instructed her to follow-up with her PCP.  ED  precautions given.  She agrees to plan of care.  Final Clinical Impressions(s) / UC Diagnoses   Final diagnoses:  Influenza A  Sore throat     Discharge Instructions      Continue the Tamiflu  as directed.  Follow up with your primary care provider tomorrow.  Go to the emergency department if you have worsening symptoms.        ED Prescriptions   None    PDMP not reviewed this encounter.    [1]  Social History Tobacco Use   Smoking status: Never   Smokeless tobacco: Never  Vaping Use   Vaping status: Never Used  Substance Use Topics   Alcohol  use: No    Alcohol /week: 0.0 standard drinks of alcohol    Drug use: No     Corlis Burnard DEL, NP 06/01/24 1355

## 2024-06-01 NOTE — Discharge Instructions (Addendum)
 Continue the Tamiflu  as directed.  Follow up with your primary care provider tomorrow.  Go to the emergency department if you have worsening symptoms.

## 2024-06-03 ENCOUNTER — Telehealth: Payer: Self-pay | Admitting: Family Medicine

## 2024-06-03 NOTE — Telephone Encounter (Signed)
 Opened in error

## 2024-06-30 ENCOUNTER — Ambulatory Visit: Admitting: Podiatry

## 2024-06-30 ENCOUNTER — Encounter: Payer: Self-pay | Admitting: Podiatry

## 2024-06-30 DIAGNOSIS — N182 Chronic kidney disease, stage 2 (mild): Secondary | ICD-10-CM | POA: Diagnosis not present

## 2024-06-30 DIAGNOSIS — M79676 Pain in unspecified toe(s): Secondary | ICD-10-CM

## 2024-06-30 DIAGNOSIS — B351 Tinea unguium: Secondary | ICD-10-CM | POA: Diagnosis not present

## 2024-06-30 DIAGNOSIS — E1122 Type 2 diabetes mellitus with diabetic chronic kidney disease: Secondary | ICD-10-CM

## 2024-07-04 ENCOUNTER — Ambulatory Visit
Admission: RE | Admit: 2024-07-04 | Discharge: 2024-07-04 | Disposition: A | Discharge status: Home / Self Care | Payer: Self-pay | Source: Ambulatory Visit | Attending: Emergency Medicine | Admitting: Emergency Medicine

## 2024-07-04 VITALS — BP 135/83 | HR 72 | Temp 98.0°F | Resp 18

## 2024-07-04 DIAGNOSIS — R062 Wheezing: Secondary | ICD-10-CM | POA: Diagnosis not present

## 2024-07-04 DIAGNOSIS — J069 Acute upper respiratory infection, unspecified: Secondary | ICD-10-CM

## 2024-07-04 MED ORDER — PROMETHAZINE-DM 6.25-15 MG/5ML PO SYRP: 2.5000 mL | ORAL_SOLUTION | Freq: Every evening | ORAL | 0 refills | Status: AC | PRN

## 2024-07-04 MED ORDER — BENZONATATE 100 MG PO CAPS: 100.0000 mg | ORAL_CAPSULE | Freq: Three times a day (TID) | ORAL | 0 refills | Status: AC

## 2024-07-04 MED ORDER — AZITHROMYCIN 250 MG PO TABS: 250.0000 mg | ORAL_TABLET | Freq: Every day | ORAL | 0 refills | Status: AC

## 2024-07-04 NOTE — ED Provider Notes (Signed)
 " Emma Fletcher    CSN: [id number] Arrival date & time: 07/04/24  1844      History   Chief Complaint Chief Complaint  Patient presents with   Cough    Entered by patient    HPI Emma Fletcher is a 69 y.o. female.   Patient presents for evaluation of nasal congestion, bilateral ear pain, sore throat, productive cough with yellow to Gaetan Spieker sputum and vomiting of mucus present for 7 days.  Last occurrence of vomiting today.  Has attempted use of Mucinex and over-the-counter cough syrup without relief.  No sick contact prior.  Tolerable to food and liquids.  Denies fever, chills breath or wheezing.      Past Medical History:  Diagnosis Date   Anemia    Bronchitis    Cardiomyopathy secondary    Developmental delay    Diabetes mellitus without complication (HCC)    Edema    Hypercholesterolemia    Unspecified essential hypertension     Patient Active Problem List   Diagnosis Date Noted   Microalbuminuria due to type 2 diabetes mellitus (HCC) 02/05/2023   Peroneal tendinitis, right 07/18/2021   Overweight 07/11/2021   Postprandial abdominal pain in right upper quadrant 07/11/2021   Peroneal tendinitis, right leg 04/25/2021   Callus 07/26/2020   Allergic rhinitis 05/20/2020   Trochanteric bursitis of right hip 02/26/2020   Bone pain 09/15/2019   Chronic obstructive pulmonary disease (HCC) 06/23/2019   Hyperlipidemia associated with type 2 diabetes mellitus (HCC) 09/20/2017   Hypothyroidism 09/20/2017   CKD (chronic kidney disease), stage II 05/16/2016   T2DM (type 2 diabetes mellitus) (HCC) 12/18/2015   Anemia of chronic disease 12/18/2015   Hypertension associated with diabetes (HCC) 11/10/2008   Secondary cardiomyopathy (HCC) 11/10/2008    Past Surgical History:  Procedure Laterality Date   COLONOSCOPY     COLONOSCOPY WITH PROPOFOL  N/A 04/24/2018   Procedure: COLONOSCOPY WITH PROPOFOL ;  Surgeon: Therisa Bi, MD;  Location: [facility] ENDOSCOPY;  Service:  Gastroenterology;  Laterality: N/A;   none      OB History     Gravida  0   Para  0   Term  0   Preterm  0   AB  0   Living  0      SAB  0   IAB  0   Ectopic  0   Multiple  0   Live Births  0            Home Medications    Prior to Admission medications  Medication Sig Start Date End Date Taking? Authorizing Provider  ascorbic acid  (VITAMIN C ) 500 MG tablet Take 500 mg by mouth daily.    [provider]  atorvastatin  (LIPITOR) 20 MG tablet TAKE 1 TABLET DAILY 03/26/24   Bacigalupo, Angela M, MD  Blood Glucose Monitoring Suppl (ONE TOUCH ULTRA 2) w/Device KIT Use as directed to check blood sugar once daily 03/26/18   Bacigalupo, Angela M, MD  Cholecalciferol  (VITAMIN D3) 125 MCG (5000 UT) CAPS Take 1 capsule (5,000 Units total) by mouth daily. TAKE 1 CAPSULE EVERY DAY 10/24/22   Bacigalupo, Angela M, MD  empagliflozin  (JARDIANCE ) 25 MG TABS tablet Take 1 tablet (25 mg total) by mouth daily. 05/08/24   Bacigalupo, Angela M, MD  Ferrous Sulfate (IRON) 325 (65 Fe) MG TABS Take 2 tablets by mouth daily.    [provider]  fluticasone -salmeterol (ADVAIR DISKUS) 100-50 MCG/ACT AEPB Inhale 1 puff into the lungs 2 (  two) times daily. 05/19/24   Bacigalupo, Angela M, MD  furosemide  (LASIX ) 20 MG tablet TAKE 1 TABLET DAILY 03/26/24   Bacigalupo, Angela M, MD  glipiZIDE  (GLUCOTROL ) 5 MG tablet Take 1 tablet (5 mg total) by mouth 2 (two) times daily before a meal. 03/14/24   Bacigalupo, Jon HERO, MD  glucose blood (ONE TOUCH ULTRA TEST) test strip Use as instructed to check blood glucose daily 03/26/18   Bacigalupo, Angela M, MD  lansoprazole  (PREVACID ) 30 MG capsule TAKE 1 CAPSULE DAILY 03/26/24   Bacigalupo, Angela M, MD  levothyroxine  (SYNTHROID ) 100 MCG tablet TAKE 1 TABLET DAILY 03/26/24   Myrla Jon HERO, MD  lisinopril  (ZESTRIL ) 20 MG tablet TAKE 1 TABLET DAILY 04/07/24   Bacigalupo, Angela M, MD  metFORMIN  (GLUCOPHAGE -XR) 500 MG 24 hr tablet Take 2 tablets  (1,000 mg total) by mouth 2 (two) times daily with a meal. 05/21/24   Franchot Isaiah LABOR, MD  Multiple Vitamins-Minerals (ONE-A-DAY WOMENS 50 PLUS PO) Take 1 tablet by mouth daily.    [provider]  OneTouch Delica Lancets 33G MISC Use as directed to check blood glucose daily 07/31/22   Bacigalupo, Angela M, MD  oseltamivir  (TAMIFLU ) 75 MG capsule Take 1 capsule (75 mg total) by mouth every 12 (twelve) hours. 05/28/24   Teresa Shelba SAUNDERS, NP  polyethylene glycol powder (GLYCOLAX /MIRALAX ) 17 GM/SCOOP powder Please use one cup of miralax  daily. 02/26/19   Janalyn Keene NOVAK, MD  Semaglutide , 2 MG/DOSE, (OZEMPIC , 2 MG/DOSE,) 8 MG/3ML SOPN Inject 2 mg into the skin once a week. 02/04/24   Myrla Jon HERO, MD    Family History Family History  Problem Relation Age of Onset   Diabetes Mother    Hypertension Mother    Stroke Mother    Diabetes Father    Coronary artery disease Father    Breast cancer Maternal Aunt    Lung cancer Maternal Uncle    Prostate cancer Paternal Uncle    Uterine cancer Other     Social History Social History[1]   Allergies   Patient has no known allergies.   Review of Systems Review of Systems  Constitutional: Negative.   HENT:  Positive for congestion, ear pain and sore throat. Negative for dental problem, drooling, ear discharge, facial swelling, hearing loss, mouth sores, nosebleeds, postnasal drip, rhinorrhea, sinus pressure, sinus pain, sneezing, tinnitus, trouble swallowing and voice change.   Respiratory:  Positive for cough. Negative for apnea, choking, chest tightness, shortness of breath, wheezing and stridor.   Gastrointestinal:  Positive for vomiting. Negative for abdominal distention, abdominal pain, anal bleeding, blood in stool, constipation, diarrhea, nausea and rectal pain.     Physical Exam Triage Vital Signs ED Triage Vitals  Encounter Vitals Group     BP 07/04/24 1914 135/83     Girls Systolic BP Percentile --      Girls  Diastolic BP Percentile --      Boys Systolic BP Percentile --      Boys Diastolic BP Percentile --      Pulse Rate 07/04/24 1914 72     Resp 07/04/24 1914 18     Temp 07/04/24 1914 98 F (36.7 C)     Temp Source 07/04/24 1914 Oral     SpO2 07/04/24 1914 99 %     Weight --      Height --      Head Circumference --      Peak Flow --      Pain Score  07/04/24 1916 0     Pain Loc --      Pain Education --      Exclude from Growth Chart --    No data found.  Updated Vital Signs BP 135/83 (BP Location: Right Arm)   Pulse 72   Temp 98 F (36.7 C) (Oral)   Resp 18   LMP  (LMP Unknown)   SpO2 99%   Visual Acuity Right Eye Distance:   Left Eye Distance:   Bilateral Distance:    Right Eye Near:   Left Eye Near:    Bilateral Near:     Physical Exam Constitutional:      Appearance: Normal appearance.  HENT:     Head: Normocephalic.     Right Ear: Tympanic membrane, ear canal and external ear normal.     Left Ear: Tympanic membrane, ear canal and external ear normal.     Nose: Congestion present.     Mouth/Throat:     Pharynx: No oropharyngeal exudate or posterior oropharyngeal erythema.  Eyes:     Extraocular Movements: Extraocular movements intact.  Cardiovascular:     Rate and Rhythm: Normal rate and regular rhythm.     Pulses: Normal pulses.     Heart sounds: Normal heart sounds.  Pulmonary:     Effort: Pulmonary effort is normal.     Breath sounds: Wheezing present.  Neurological:     Mental Status: She is alert and oriented to person, place, and time. Mental status is at baseline.      UC Treatments / Results  Labs (all labs ordered are listed, but only abnormal results are displayed) Labs Reviewed - No data to display  EKG   Radiology No results found.  Procedures Procedures (including critical care time)  Medications Ordered in UC Medications - No data to display  Initial Impression / Assessment and Plan / UC Course  I have reviewed the  triage vital signs and the nursing notes.  Pertinent labs & imaging results that were available during my care of the patient were reviewed by me and considered in my medical decision making (see chart for details).  Acute URI  Patient is in no signs of distress nor toxic appearing.  Vital signs are stable.  Low suspicion for pneumonia, pneumothorax or bronchitis and therefore will defer imaging.  Prescribed azithromycin , Tessalon  and Promethazine  DM. May use additional over-the-counter medications as needed for supportive care.  May follow-up with urgent care as needed if symptoms persist or worsen.   Final Clinical Impressions(s) / UC Diagnoses   Final diagnoses:  None   Discharge Instructions   None    ED Prescriptions   None    PDMP not reviewed this encounter.     [1]  Social History Tobacco Use   Smoking status: Never   Smokeless tobacco: Never  Vaping Use   Vaping status: Never Used  Substance Use Topics   Alcohol  use: No    Alcohol /week: 0.0 standard drinks of alcohol    Drug use: No     Teresa Shelba SAUNDERS, NP 07/04/24 1936  "

## 2024-07-04 NOTE — ED Triage Notes (Signed)
 Patient reports cough x 1 week. Patient has been taking mucinex and OTC cough syrup with no relief.

## 2024-07-04 NOTE — Discharge Instructions (Signed)
 Begin azithromycin  for treatment of bacteria  You may use Tessalon  pill every 8 hours as needed for coughing and may use cough syrup at bedtime to allow for rest    You can take Tylenol  and/or Ibuprofen as needed for fever reduction and pain relief.   For cough: honey 1/2 to 1 teaspoon (you can dilute the honey in water or another fluid).  You can also use guaifenesin and dextromethorphan for cough. You can use a humidifier for chest congestion and cough.  If you don't have a humidifier, you can sit in the bathroom with the hot shower running.      For sore throat: try warm salt water gargles, cepacol lozenges, throat spray, warm tea or water with lemon/honey, popsicles or ice, or OTC cold relief medicine for throat discomfort.   For congestion: take a daily anti-histamine like Zyrtec , Claritin, and a oral decongestant, such as pseudoephedrine.  You can also use Flonase  1-2 sprays in each nostril daily.   It is important to stay hydrated: drink plenty of fluids (water, gatorade/powerade/pedialyte, juices, or teas) to keep your throat moisturized and help further relieve irritation/discomfort.

## 2024-07-07 ENCOUNTER — Encounter: Payer: Self-pay | Admitting: Podiatry

## 2024-07-07 NOTE — Progress Notes (Signed)
"  °  Subjective:  Patient ID: Emma Fletcher, female    DOB: 1956-02-18,  MRN: [id number]  Emma Fletcher presents to clinic today for at risk foot care. Pt has h/o NIDDM with chronic kidney disease and painful thick toenails that are difficult to trim. Pain interferes with ambulation. Aggravating factors include wearing enclosed shoe gear. Pain is relieved with periodic professional debridement.  Chief Complaint  Patient presents with   Diabetes    A1c 7.4 She saw Dr. Myrla in Nov.   New problem(s): None.   PCP is Bacigalupo, Emma HERO, Emma Fletcher.  Allergies[1]  Review of Systems: Negative except as noted in the HPI.  Objective: No changes noted in today's physical examination. There were no vitals filed for this visit. Emma Fletcher is a pleasant 69 y.o. female WD, WN in NAD. AAO x 3.  Vascular Examination: Capillary refill time immediate b/l. Vascular status intact b/l with palpable pedal pulses. Pedal hair present b/l. No pain with calf compression b/l. Skin temperature gradient WNL b/l. No cyanosis or clubbing b/l. No ischemia or gangrene noted b/l. No edema noted b/l LE.  Neurological Examination: Sensation grossly intact b/l with 10 gram monofilament. Vibratory sensation intact b/l.   Dermatological Examination: Ecchymosis noted left 2nd digit. No breaks in skin, no erythema, no edema. Pedal skin with normal turgor, texture and tone b/l.  No open wounds. No interdigital macerations.   Toenails 1-5 b/l thick, discolored, elongated with subungual debris and pain on dorsal palpation.   Musculoskeletal Examination: Left 2nd toe with normal active/passive ROM. Muscle strength 5/5 to all lower extremity muscle groups bilaterally. No pain, crepitus or joint limitation noted with ROM bilateral LE. No gross bony deformities bilaterally.  Radiographs: None  Assessment/Plan: 1. Pain due to onychomycosis of toenail   2. Type 2 diabetes mellitus with stage 2 chronic kidney disease,  without long-term current use of insulin  [facility])     Patient was evaluated and treated. All patient's and/or POA's questions/concerns addressed on today's visit. Mycotic toenails 1-5 b/l debrided in length and girth without incident.  Continue daily foot inspections and monitor blood glucose per PCP/Endocrinologist's recommendations.Continue soft, supportive shoe gear daily. Report any pedal injuries to medical professional. Call office if there are any quesitons/concerns. -Patient/POA to call should there be question/concern in the interim.   Return in about 3 months (around 09/28/2024).  Emma Fletcher, DPM      [place] LOCATION: [address], [place] [address]                   Office [phone]   [facility] LOCATION: [address] [place], [place] [address] Office [phone]     [1] No Known Allergies  "

## 2024-07-25 ENCOUNTER — Other Ambulatory Visit: Payer: Self-pay | Admitting: Family Medicine

## 2024-08-12 ENCOUNTER — Ambulatory Visit: Payer: Medicare Other

## 2024-08-18 ENCOUNTER — Ambulatory Visit: Admitting: Family Medicine

## 2024-09-08 ENCOUNTER — Ambulatory Visit: Admitting: Podiatry
